# Patient Record
Sex: Male | Born: 1970 | Race: White | Hispanic: No | Marital: Married | Smoking: Never smoker
Health system: Southern US, Community
[De-identification: ages and names within clinical notes are randomized; demographics above are authoritative.]

## PROBLEM LIST (undated history)

## (undated) DIAGNOSIS — E119 Type 2 diabetes mellitus without complications: Secondary | ICD-10-CM

## (undated) DIAGNOSIS — I10 Essential (primary) hypertension: Secondary | ICD-10-CM

## (undated) DIAGNOSIS — I5032 Chronic diastolic (congestive) heart failure: Secondary | ICD-10-CM

## (undated) HISTORY — PX: SPHINCTEROTOMY: SHX5279

## (undated) HISTORY — PX: SKIN DEBRIDEMENT: SHX5235

## (undated) HISTORY — PX: TOE AMPUTATION: SHX809

---

## 2014-10-23 ENCOUNTER — Emergency Department
Admit: 2014-10-23 | Disposition: A | Discharge status: Home / Self Care | Payer: Self-pay | Admitting: Emergency Medicine

## 2014-10-23 LAB — BASIC METABOLIC PANEL
ANION GAP: 8 (ref 7–16)
BUN: 14 mg/dL
CREATININE: 0.78 mg/dL
Calcium, Total: 8.3 mg/dL — ABNORMAL LOW
Chloride: 96 mmol/L — ABNORMAL LOW
Co2: 28 mmol/L
EGFR (Non-African Amer.): >60
Glucose: 322 mg/dL — ABNORMAL HIGH
Potassium: 3.8 mmol/L
Sodium: 132 mmol/L — ABNORMAL LOW

## 2014-10-23 LAB — CBC
HCT: 41.4 % (ref 40.0–52.0)
HGB: 13.5 g/dL (ref 13.0–18.0)
MCH: 28.9 pg (ref 26.0–34.0)
MCHC: 32.7 g/dL (ref 32.0–36.0)
MCV: 88 fL (ref 80–100)
Platelet: 351 10*3/uL (ref 150–440)
RBC: 4.68 10*6/uL (ref 4.40–5.90)
RDW: 12.8 % (ref 11.5–14.5)
WBC: 8 10*3/uL (ref 3.8–10.6)

## 2014-10-23 LAB — TROPONIN I

## 2014-10-23 LAB — PRO B NATRIURETIC PEPTIDE: B-Type Natriuretic Peptide: 29 pg/mL

## 2017-03-04 ENCOUNTER — Ambulatory Visit
Admission: RE | Admit: 2017-03-04 | Discharge: 2017-03-04 | Disposition: A | Discharge status: Home / Self Care | Payer: Disability Insurance | Source: Ambulatory Visit | Attending: Family Medicine | Admitting: Family Medicine

## 2017-03-04 ENCOUNTER — Other Ambulatory Visit: Payer: Self-pay | Admitting: Family Medicine

## 2017-03-04 DIAGNOSIS — M4186 Other forms of scoliosis, lumbar region: Secondary | ICD-10-CM | POA: Diagnosis not present

## 2017-03-04 DIAGNOSIS — M1711 Unilateral primary osteoarthritis, right knee: Secondary | ICD-10-CM | POA: Insufficient documentation

## 2017-03-04 DIAGNOSIS — M1611 Unilateral primary osteoarthritis, right hip: Secondary | ICD-10-CM | POA: Insufficient documentation

## 2017-03-04 DIAGNOSIS — M5136 Other intervertebral disc degeneration, lumbar region: Secondary | ICD-10-CM | POA: Diagnosis not present

## 2017-03-04 DIAGNOSIS — M1990 Unspecified osteoarthritis, unspecified site: Secondary | ICD-10-CM

## 2017-03-04 DIAGNOSIS — M25461 Effusion, right knee: Secondary | ICD-10-CM | POA: Insufficient documentation

## 2017-03-04 DIAGNOSIS — M25552 Pain in left hip: Secondary | ICD-10-CM | POA: Diagnosis not present

## 2017-03-04 DIAGNOSIS — M25559 Pain in unspecified hip: Secondary | ICD-10-CM | POA: Diagnosis present

## 2017-03-04 DIAGNOSIS — M25561 Pain in right knee: Secondary | ICD-10-CM | POA: Diagnosis present

## 2018-02-12 ENCOUNTER — Emergency Department: Payer: Medicaid Other

## 2018-02-12 ENCOUNTER — Observation Stay
Admit: 2018-02-12 | Discharge: 2018-02-12 | Disposition: A | Discharge status: Home / Self Care | Payer: Medicaid Other | Attending: Internal Medicine | Admitting: Internal Medicine

## 2018-02-12 ENCOUNTER — Other Ambulatory Visit: Payer: Self-pay

## 2018-02-12 ENCOUNTER — Inpatient Hospital Stay
Admission: EM | Admit: 2018-02-12 | Discharge: 2018-02-27 | DRG: 291 | Disposition: A | Discharge status: Home Health | Payer: Medicaid Other | Attending: Internal Medicine | Admitting: Internal Medicine

## 2018-02-12 DIAGNOSIS — E1121 Type 2 diabetes mellitus with diabetic nephropathy: Secondary | ICD-10-CM | POA: Diagnosis present

## 2018-02-12 DIAGNOSIS — E8809 Other disorders of plasma-protein metabolism, not elsewhere classified: Secondary | ICD-10-CM | POA: Diagnosis present

## 2018-02-12 DIAGNOSIS — L97919 Non-pressure chronic ulcer of unspecified part of right lower leg with unspecified severity: Secondary | ICD-10-CM | POA: Diagnosis present

## 2018-02-12 DIAGNOSIS — J9602 Acute respiratory failure with hypercapnia: Secondary | ICD-10-CM | POA: Diagnosis present

## 2018-02-12 DIAGNOSIS — I110 Hypertensive heart disease with heart failure: Principal | ICD-10-CM | POA: Diagnosis present

## 2018-02-12 DIAGNOSIS — L03116 Cellulitis of left lower limb: Secondary | ICD-10-CM | POA: Diagnosis present

## 2018-02-12 DIAGNOSIS — I10 Essential (primary) hypertension: Secondary | ICD-10-CM | POA: Diagnosis present

## 2018-02-12 DIAGNOSIS — D649 Anemia, unspecified: Secondary | ICD-10-CM | POA: Diagnosis present

## 2018-02-12 DIAGNOSIS — Z89412 Acquired absence of left great toe: Secondary | ICD-10-CM

## 2018-02-12 DIAGNOSIS — Z6834 Body mass index (BMI) 34.0-34.9, adult: Secondary | ICD-10-CM

## 2018-02-12 DIAGNOSIS — E875 Hyperkalemia: Secondary | ICD-10-CM | POA: Diagnosis not present

## 2018-02-12 DIAGNOSIS — N179 Acute kidney failure, unspecified: Secondary | ICD-10-CM | POA: Diagnosis present

## 2018-02-12 DIAGNOSIS — I5033 Acute on chronic diastolic (congestive) heart failure: Secondary | ICD-10-CM | POA: Diagnosis present

## 2018-02-12 DIAGNOSIS — E119 Type 2 diabetes mellitus without complications: Secondary | ICD-10-CM

## 2018-02-12 DIAGNOSIS — Z833 Family history of diabetes mellitus: Secondary | ICD-10-CM

## 2018-02-12 DIAGNOSIS — Z8349 Family history of other endocrine, nutritional and metabolic diseases: Secondary | ICD-10-CM

## 2018-02-12 DIAGNOSIS — J189 Pneumonia, unspecified organism: Secondary | ICD-10-CM | POA: Diagnosis present

## 2018-02-12 DIAGNOSIS — Z794 Long term (current) use of insulin: Secondary | ICD-10-CM

## 2018-02-12 DIAGNOSIS — R0602 Shortness of breath: Secondary | ICD-10-CM

## 2018-02-12 DIAGNOSIS — E1165 Type 2 diabetes mellitus with hyperglycemia: Secondary | ICD-10-CM | POA: Diagnosis present

## 2018-02-12 DIAGNOSIS — E669 Obesity, unspecified: Secondary | ICD-10-CM | POA: Diagnosis present

## 2018-02-12 DIAGNOSIS — J9811 Atelectasis: Secondary | ICD-10-CM

## 2018-02-12 DIAGNOSIS — L03115 Cellulitis of right lower limb: Secondary | ICD-10-CM | POA: Diagnosis present

## 2018-02-12 DIAGNOSIS — N139 Obstructive and reflux uropathy, unspecified: Secondary | ICD-10-CM | POA: Diagnosis present

## 2018-02-12 DIAGNOSIS — J441 Chronic obstructive pulmonary disease with (acute) exacerbation: Secondary | ICD-10-CM | POA: Diagnosis present

## 2018-02-12 DIAGNOSIS — E1140 Type 2 diabetes mellitus with diabetic neuropathy, unspecified: Secondary | ICD-10-CM | POA: Diagnosis present

## 2018-02-12 DIAGNOSIS — Z91138 Patient's unintentional underdosing of medication regimen for other reason: Secondary | ICD-10-CM

## 2018-02-12 DIAGNOSIS — L97929 Non-pressure chronic ulcer of unspecified part of left lower leg with unspecified severity: Secondary | ICD-10-CM | POA: Diagnosis present

## 2018-02-12 DIAGNOSIS — T501X6A Underdosing of loop [high-ceiling] diuretics, initial encounter: Secondary | ICD-10-CM | POA: Diagnosis present

## 2018-02-12 DIAGNOSIS — I509 Heart failure, unspecified: Secondary | ICD-10-CM

## 2018-02-12 DIAGNOSIS — J9601 Acute respiratory failure with hypoxia: Secondary | ICD-10-CM | POA: Diagnosis present

## 2018-02-12 DIAGNOSIS — E11622 Type 2 diabetes mellitus with other skin ulcer: Secondary | ICD-10-CM | POA: Diagnosis present

## 2018-02-12 DIAGNOSIS — E1151 Type 2 diabetes mellitus with diabetic peripheral angiopathy without gangrene: Secondary | ICD-10-CM | POA: Diagnosis present

## 2018-02-12 DIAGNOSIS — K219 Gastro-esophageal reflux disease without esophagitis: Secondary | ICD-10-CM | POA: Diagnosis present

## 2018-02-12 HISTORY — DX: Essential (primary) hypertension: I10

## 2018-02-12 HISTORY — DX: Type 2 diabetes mellitus without complications: E11.9

## 2018-02-12 HISTORY — DX: Chronic diastolic (congestive) heart failure: I50.32

## 2018-02-12 LAB — BASIC METABOLIC PANEL
Anion gap: 5 (ref 5–15)
Anion gap: 4 — ABNORMAL LOW (ref 5–15)
BUN: 29 mg/dL — ABNORMAL HIGH (ref 6–20)
BUN: 32 mg/dL — AB (ref 6–20)
CALCIUM: 8.2 mg/dL — AB (ref 8.9–10.3)
CHLORIDE: 114 mmol/L — AB (ref 98–111)
CHLORIDE: 115 mmol/L — AB (ref 98–111)
CO2: 23 mmol/L (ref 22–32)
CO2: 26 mmol/L (ref 22–32)
CREATININE: 1.26 mg/dL — AB (ref 0.61–1.24)
Calcium: 8.3 mg/dL — ABNORMAL LOW (ref 8.9–10.3)
Creatinine, Ser: 1.22 mg/dL (ref 0.61–1.24)
GFR calc Af Amer: >60 mL/min (ref >60)
GFR calc non Af Amer: >60 mL/min (ref >60)
GFR calc non Af Amer: >60 mL/min (ref >60)
GLUCOSE: 182 mg/dL — AB (ref 70–99)
Glucose, Bld: 270 mg/dL — ABNORMAL HIGH (ref 70–99)
POTASSIUM: 4.8 mmol/L (ref 3.5–5.1)
Potassium: 4.4 mmol/L (ref 3.5–5.1)
Sodium: 142 mmol/L (ref 135–145)
Sodium: 145 mmol/L (ref 135–145)

## 2018-02-12 LAB — CBC
HCT: 27.9 % — ABNORMAL LOW (ref 40.0–52.0)
HEMATOCRIT: 30.6 % — AB (ref 40.0–52.0)
HEMOGLOBIN: 10.1 g/dL — AB (ref 13.0–18.0)
Hemoglobin: 9.2 g/dL — ABNORMAL LOW (ref 13.0–18.0)
MCH: 26.4 pg (ref 26.0–34.0)
MCH: 26.3 pg (ref 26.0–34.0)
MCHC: 33 g/dL (ref 32.0–36.0)
MCHC: 32.9 g/dL (ref 32.0–36.0)
MCV: 80.1 fL (ref 80.0–100.0)
MCV: 79.9 fL — AB (ref 80.0–100.0)
PLATELETS: 332 10*3/uL (ref 150–440)
Platelets: 388 10*3/uL (ref 150–440)
RBC: 3.83 MIL/uL — AB (ref 4.40–5.90)
RBC: 3.49 MIL/uL — AB (ref 4.40–5.90)
RDW: 17.6 % — ABNORMAL HIGH (ref 11.5–14.5)
RDW: 17.3 % — AB (ref 11.5–14.5)
WBC: 8.7 10*3/uL (ref 3.8–10.6)
WBC: 7.8 10*3/uL (ref 3.8–10.6)

## 2018-02-12 LAB — GLUCOSE, CAPILLARY
GLUCOSE-CAPILLARY: 265 mg/dL — AB (ref 70–99)
GLUCOSE-CAPILLARY: 109 mg/dL — AB (ref 70–99)
Glucose-Capillary: 182 mg/dL — ABNORMAL HIGH (ref 70–99)
Glucose-Capillary: 121 mg/dL — ABNORMAL HIGH (ref 70–99)

## 2018-02-12 LAB — FERRITIN: Ferritin: 36 ng/mL (ref 24–336)

## 2018-02-12 LAB — BRAIN NATRIURETIC PEPTIDE: B NATRIURETIC PEPTIDE 5: 151 pg/mL — AB (ref 0.0–100.0)

## 2018-02-12 LAB — TROPONIN I: Troponin I: <0.03 ng/mL (ref <0.03)

## 2018-02-12 MED ORDER — IPRATROPIUM-ALBUTEROL 0.5-2.5 (3) MG/3ML IN SOLN
3.0000 mL | Freq: Four times a day (QID) | RESPIRATORY_TRACT | Status: DC
  Administered 2018-02-12 – 2018-02-22 (×38): 3 mL via RESPIRATORY_TRACT
  Filled 2018-02-12 (×41): qty 3

## 2018-02-12 MED ORDER — POTASSIUM CHLORIDE CRYS ER 20 MEQ PO TBCR
40.0000 meq | EXTENDED_RELEASE_TABLET | Freq: Two times a day (BID) | ORAL | Status: DC
  Administered 2018-02-12 – 2018-02-13 (×3): 40 meq via ORAL
  Filled 2018-02-12 (×3): qty 2

## 2018-02-12 MED ORDER — ACETAMINOPHEN 650 MG RE SUPP: 650.0000 mg | Freq: Four times a day (QID) | RECTAL | Status: DC | PRN

## 2018-02-12 MED ORDER — ACETAMINOPHEN 325 MG PO TABS: 650.0000 mg | ORAL_TABLET | Freq: Four times a day (QID) | ORAL | Status: DC | PRN

## 2018-02-12 MED ORDER — IPRATROPIUM-ALBUTEROL 0.5-2.5 (3) MG/3ML IN SOLN
RESPIRATORY_TRACT | Status: AC
  Administered 2018-02-12: 3 mL
  Filled 2018-02-12: qty 3

## 2018-02-12 MED ORDER — FUROSEMIDE 10 MG/ML IJ SOLN
80.0000 mg | Freq: Once | INTRAMUSCULAR | Status: AC
  Administered 2018-02-12: 80 mg via INTRAVENOUS

## 2018-02-12 MED ORDER — ONDANSETRON HCL 4 MG PO TABS: 4.0000 mg | ORAL_TABLET | Freq: Four times a day (QID) | ORAL | Status: DC | PRN

## 2018-02-12 MED ORDER — INSULIN ASPART 100 UNIT/ML ~~LOC~~ SOLN
0.0000 [IU] | Freq: Every day | SUBCUTANEOUS | Status: DC
  Administered 2018-02-14: 2 [IU] via SUBCUTANEOUS
  Administered 2018-02-15 – 2018-02-16 (×2): 3 [IU] via SUBCUTANEOUS
  Administered 2018-02-17: 4 [IU] via SUBCUTANEOUS
  Filled 2018-02-12 (×4): qty 1

## 2018-02-12 MED ORDER — LIDOCAINE 5 % EX PTCH
1.0000 | MEDICATED_PATCH | CUTANEOUS | Status: DC
  Administered 2018-02-12: 1 via TRANSDERMAL
  Filled 2018-02-12: qty 1

## 2018-02-12 MED ORDER — LIDOCAINE 5 % EX PTCH
2.0000 | MEDICATED_PATCH | CUTANEOUS | Status: DC
  Administered 2018-02-12 – 2018-02-26 (×14): 2 via TRANSDERMAL
  Filled 2018-02-12 (×19): qty 2

## 2018-02-12 MED ORDER — NITROGLYCERIN 2 % TD OINT
1.0000 [in_us] | TOPICAL_OINTMENT | Freq: Once | TRANSDERMAL | Status: AC
  Administered 2018-02-12: 1 [in_us] via TOPICAL
  Filled 2018-02-12: qty 1

## 2018-02-12 MED ORDER — INSULIN GLARGINE 100 UNIT/ML ~~LOC~~ SOLN
20.0000 [IU] | Freq: Every day | SUBCUTANEOUS | Status: DC
  Administered 2018-02-12 – 2018-02-15 (×4): 20 [IU] via SUBCUTANEOUS
  Filled 2018-02-12 (×6): qty 0.2

## 2018-02-12 MED ORDER — PANTOPRAZOLE SODIUM 40 MG PO TBEC
40.0000 mg | DELAYED_RELEASE_TABLET | Freq: Two times a day (BID) | ORAL | Status: DC
  Administered 2018-02-12 – 2018-02-27 (×32): 40 mg via ORAL
  Filled 2018-02-12 (×32): qty 1

## 2018-02-12 MED ORDER — BUDESONIDE 0.5 MG/2ML IN SUSP
0.5000 mg | Freq: Two times a day (BID) | RESPIRATORY_TRACT | Status: DC
  Administered 2018-02-12 – 2018-02-27 (×30): 0.5 mg via RESPIRATORY_TRACT
  Filled 2018-02-12 (×31): qty 2

## 2018-02-12 MED ORDER — INSULIN ASPART 100 UNIT/ML ~~LOC~~ SOLN
0.0000 [IU] | Freq: Three times a day (TID) | SUBCUTANEOUS | Status: DC
  Administered 2018-02-12: 2 [IU] via SUBCUTANEOUS
  Administered 2018-02-12 – 2018-02-13 (×2): 5 [IU] via SUBCUTANEOUS
  Administered 2018-02-13 (×2): 3 [IU] via SUBCUTANEOUS
  Administered 2018-02-14: 9 [IU] via SUBCUTANEOUS
  Administered 2018-02-14 (×2): 7 [IU] via SUBCUTANEOUS
  Filled 2018-02-12 (×7): qty 1

## 2018-02-12 MED ORDER — TRAZODONE HCL 50 MG PO TABS
150.0000 mg | ORAL_TABLET | Freq: Every evening | ORAL | Status: DC | PRN
  Filled 2018-02-12: qty 1

## 2018-02-12 MED ORDER — ASPIRIN 81 MG PO CHEW
324.0000 mg | CHEWABLE_TABLET | Freq: Once | ORAL | Status: AC
  Administered 2018-02-12: 324 mg via ORAL
  Filled 2018-02-12: qty 4

## 2018-02-12 MED ORDER — MORPHINE SULFATE (PF) 4 MG/ML IV SOLN
4.0000 mg | Freq: Once | INTRAVENOUS | Status: AC
  Administered 2018-02-12: 4 mg via INTRAVENOUS
  Filled 2018-02-12: qty 1

## 2018-02-12 MED ORDER — GABAPENTIN 400 MG PO CAPS
800.0000 mg | ORAL_CAPSULE | Freq: Three times a day (TID) | ORAL | Status: DC
  Administered 2018-02-12 – 2018-02-27 (×46): 800 mg via ORAL
  Filled 2018-02-12 (×46): qty 2

## 2018-02-12 MED ORDER — INSULIN GLARGINE 100 UNITS/ML SOLOSTAR PEN: 20.0000 [IU] | PEN_INJECTOR | Freq: Every day | SUBCUTANEOUS | Status: DC

## 2018-02-12 MED ORDER — ONDANSETRON HCL 4 MG/2ML IJ SOLN: 4.0000 mg | Freq: Four times a day (QID) | INTRAMUSCULAR | Status: DC | PRN

## 2018-02-12 MED ORDER — ENOXAPARIN SODIUM 40 MG/0.4ML ~~LOC~~ SOLN
40.0000 mg | SUBCUTANEOUS | Status: DC
  Administered 2018-02-12 – 2018-02-25 (×14): 40 mg via SUBCUTANEOUS
  Filled 2018-02-12 (×14): qty 0.4

## 2018-02-12 MED ORDER — LISINOPRIL 10 MG PO TABS
10.0000 mg | ORAL_TABLET | Freq: Every day | ORAL | Status: DC
  Administered 2018-02-12 – 2018-02-27 (×16): 10 mg via ORAL
  Filled 2018-02-12 (×16): qty 1

## 2018-02-12 MED ORDER — OXYCODONE HCL 5 MG PO TABS
5.0000 mg | ORAL_TABLET | Freq: Four times a day (QID) | ORAL | Status: DC | PRN
  Administered 2018-02-12 (×3): 5 mg via ORAL
  Filled 2018-02-12 (×3): qty 1

## 2018-02-12 MED ORDER — ENOXAPARIN SODIUM 40 MG/0.4ML ~~LOC~~ SOLN: 40.0000 mg | SUBCUTANEOUS | Status: DC

## 2018-02-12 MED ORDER — SPIRONOLACTONE 25 MG PO TABS
25.0000 mg | ORAL_TABLET | Freq: Every day | ORAL | Status: DC
  Administered 2018-02-12 – 2018-02-14 (×3): 25 mg via ORAL
  Filled 2018-02-12 (×3): qty 1

## 2018-02-12 MED ORDER — FUROSEMIDE 40 MG PO TABS
80.0000 mg | ORAL_TABLET | Freq: Two times a day (BID) | ORAL | Status: DC
  Administered 2018-02-12: 80 mg via ORAL
  Filled 2018-02-12: qty 2

## 2018-02-12 MED ORDER — FUROSEMIDE 10 MG/ML IJ SOLN
6.0000 mg/h | INTRAVENOUS | Status: DC
  Administered 2018-02-12: 8 mg/h via INTRAVENOUS
  Administered 2018-02-13: 4 mg/h via INTRAVENOUS
  Administered 2018-02-16 – 2018-02-20 (×5): 8 mg/h via INTRAVENOUS
  Filled 2018-02-12 (×9): qty 25

## 2018-02-12 NOTE — Progress Notes (Signed)
*  PRELIMINARY RESULTS* Echocardiogram 2D Echocardiogram has been performed.  John GulaJoan M Casy Lawson 02/12/2018, 11:02 AM

## 2018-02-12 NOTE — ED Provider Notes (Signed)
[facility] Emergency Department Provider Note   ____________________________________________   First MD Initiated Contact with Patient 02/12/18 0122     (approximate)  I have reviewed the triage vital signs and the nursing notes.   HISTORY  Chief Complaint Shortness of Breath    HPI John Lawson is a 47 y.o. male brought to the ED from home via EMS with a chief complaint of shortness of breath.  Patient has a history of hypertension, diabetes, CHF who ran out of his Lasix 1 week ago.  Presents with a 50 pound weight gain over the past week, diffuse swelling and shortness of breath with wheezing.  Also complains of chest tightness.  Denies fever, chills, cough, abdominal pain, nausea or vomiting.  Patient denies recent travel or trauma.   Past Medical History:  Diagnosis Date  . Chronic diastolic CHF (congestive heart failure) (HCC)   . Diabetes mellitus without complication (HCC)   . Hypertension     Patient Active Problem List   Diagnosis Date Noted  . Acute on chronic diastolic CHF (congestive heart failure) (HCC) 02/12/2018  . Uncontrolled hypertension 02/12/2018  . Diabetes (HCC) 02/12/2018    Past Surgical History:  Procedure Laterality Date  . SKIN DEBRIDEMENT    . SPHINCTEROTOMY    . TOE AMPUTATION      Prior to Admission medications   Medication Sig Start Date End Date Taking? Authorizing Provider  albuterol (PROVENTIL HFA;VENTOLIN HFA) 108 (90 Base) MCG/ACT inhaler Inhale 2 puffs into the lungs every 6 (six) hours as needed for wheezing.    [provider]  cholestyramine (QUESTRAN) 4 g packet Take 4 g by mouth 2 (two) times daily.    [provider]  furosemide (LASIX) 80 MG tablet Take 80 mg by mouth 2 (two) times daily.    [provider]  gabapentin (NEURONTIN) 800 MG tablet Take 800 mg by mouth 3 (three) times daily.    [provider]  insulin glargine (LANTUS) 100 unit/mL SOPN Inject 28  Units into the skin at bedtime.    [provider]  lisinopril (PRINIVIL,ZESTRIL) 10 MG tablet Take 10 mg by mouth daily.    [provider]  metFORMIN (GLUCOPHAGE-XR) 500 MG 24 hr tablet Take 2,000 mg by mouth daily with breakfast.    [provider]  Multiple Vitamin (MULTIVITAMIN WITH MINERALS) TABS tablet Take 1 tablet by mouth daily.    [provider]  naproxen (NAPROSYN) 500 MG tablet Take 500 mg by mouth 2 (two) times daily as needed for mild pain.    [provider]  omeprazole (PRILOSEC) 20 MG capsule Take 40 mg by mouth 2 (two) times daily before a meal.    [provider]  spironolactone (ALDACTONE) 25 MG tablet Take 25 mg by mouth daily.    [provider]  traZODone (DESYREL) 150 MG tablet Take 150 mg by mouth at bedtime as needed for sleep.    [provider]    Allergies Patient has no allergy information on record.  Family History  Problem Relation Age of Onset  . Diabetes Mother   . Cancer Mother   . Thyroid disease Mother   . Diabetes Father   . Diabetes Sister     Social History Social History   Tobacco Use  . Smoking status: Unknown If Ever Smoked  Substance Use Topics  . Alcohol use: Not on file  . Drug use: Not on file    Review of Systems  Constitutional: No fever/chills Eyes: No visual changes. ENT: No sore throat. Cardiovascular: Positive for chest pain. Respiratory: Positive for shortness of breath. Gastrointestinal: No abdominal pain.  No nausea, no vomiting.  No diarrhea.  No constipation. Genitourinary: Negative for dysuria. Musculoskeletal: Positive for diffuse swelling.  Negative for back pain. Skin: Negative for rash. Neurological: Negative for headaches, focal weakness or numbness.   ____________________________________________   PHYSICAL EXAM:  VITAL SIGNS: ED Triage Vitals  Enc Vitals Group     BP 02/12/18 0115 (!) 210/103     Pulse Rate 02/12/18 0115 (!)  103     Resp 02/12/18 0115 (!) 23     Temp 02/12/18 0115 97.6 F (36.4 C)     Temp Source 02/12/18 0115 Oral     SpO2 02/12/18 0115 97 %     Weight 02/12/18 0116 225 lb (102.1 kg)     Height 02/12/18 0116 6\' 2"  (1.88 m)     Head Circumference --      Peak Flow --      Pain Score 02/12/18 0116 10     Pain Loc --      Pain Edu? --      Excl. in GC? --     Constitutional: Alert and oriented. Well appearing and in moderate acute distress. Eyes: Conjunctivae are normal. PERRL. EOMI. Head: Atraumatic. Nose: No congestion/rhinnorhea. Mouth/Throat: Mucous membranes are moist.  Oropharynx non-erythematous. Neck: No stridor.   Cardiovascular: Tachycardic rate, regular rhythm. Grossly normal heart sounds.  Good peripheral circulation. Respiratory: Increased respiratory effort.  No retractions. Lungs with diffuse rales. Gastrointestinal: Moderate swelling.  Soft and nontender. No distention. No abdominal bruits. No CVA tenderness. Genitourinary: Scrotal swelling. Musculoskeletal: No lower extremity tenderness. 3+ BLE pitting edema.  No joint effusions. Neurologic:  Normal speech and language. No gross focal neurologic deficits are appreciated.  Skin:  Skin is warm, dry and intact. No rash noted. Psychiatric: Mood and affect are normal. Speech and behavior are normal.  ____________________________________________   LABS (all labs ordered are listed, but only abnormal results are displayed)  Labs Reviewed  CBC - Abnormal; Notable for the following components:      Result Value   RBC 3.83 (*)    Hemoglobin 10.1 (*)    HCT 30.6 (*)    RDW 17.6 (*)    All other components within normal limits  BASIC METABOLIC PANEL - Abnormal; Notable for the following components:   Chloride 114 (*)    Glucose, Bld 182 (*)    BUN 29 (*)    Calcium 8.3 (*)    All other components within normal limits  BRAIN NATRIURETIC PEPTIDE - Abnormal; Notable for the following components:   B Natriuretic Peptide  151.0 (*)    All other components within normal limits  TROPONIN I   ____________________________________________  EKG  ED ECG REPORT I, SUNG,JADE J, the attending physician, personally viewed and interpreted this ECG.   Date: 02/12/2018  EKG Time: 0115  Rate: 103  Rhythm: sinus tachycardia  Axis: Normal  Intervals:none  ST&T Change: Nonspecific  ____________________________________________  RADIOLOGY  ED MD interpretation: Interstitial edema; small right pleural effusion  Official radiology report(s): Dg Chest Port 1 View  Result Date: 02/12/2018 CLINICAL DATA:  Acute onset of shortness of breath. Expiratory wheezing. EXAM: PORTABLE CHEST 1 VIEW COMPARISON:  Chest radiograph performed 10/23/2014, and CTA of the chest performed 10/24/2014 FINDINGS: A small right pleural effusion is noted, with associated opacity. Increased interstitial markings may reflect mild interstitial  edema. Alternatively, right basilar pneumonia could have a similar appearance. No pneumothorax is seen. The cardiomediastinal silhouette is borderline normal in size. No acute osseous abnormalities are identified. IMPRESSION: Small right pleural effusion noted. Increased interstitial markings may reflect mild interstitial edema. Alternatively, right basilar pneumonia could have a similar appearance. Electronically Signed   By: Roanna Raider M.D.   On: 02/12/2018 02:36    ____________________________________________   PROCEDURES  Procedure(s) performed: None  Procedures  Critical Care performed: Yes, see critical care note(s)   CRITICAL CARE Performed by: Irean Hong   Total critical care time: 30 minutes  Critical care time was exclusive of separately billable procedures and treating other patients.  Critical care was necessary to treat or prevent imminent or life-threatening deterioration.  Critical care was time spent personally by me on the following activities: development of treatment plan  with patient and/or surrogate as well as nursing, discussions with consultants, evaluation of patient's response to treatment, examination of patient, obtaining history from patient or surrogate, ordering and performing treatments and interventions, ordering and review of laboratory studies, ordering and review of radiographic studies, pulse oximetry and re-evaluation of patient's condition.  ____________________________________________   INITIAL IMPRESSION / ASSESSMENT AND PLAN / ED COURSE  As part of my medical decision making, I reviewed the following data within the electronic MEDICAL RECORD NUMBER Nursing notes reviewed and incorporated, Labs reviewed, EKG interpreted, Old chart reviewed, Radiograph reviewed, Discussed with admitting physician and Notes from prior ED visits   47 year old male with hypertension, diabetes and congestive heart failure who presents with increased generalized swelling and shortness of breath after running out of his Lasix x1 week. Differential includes, but is not limited to, viral syndrome, bronchitis including COPD exacerbation, pneumonia, reactive airway disease including asthma, CHF including exacerbation with or without pulmonary/interstitial edema, pneumothorax, ACS, thoracic trauma, and pulmonary embolism.  Will administer aspirin, morphine, Lasix and apply nitroglycerin paste.  Anticipate hospitalization.  Clinical Course as of Feb 13 240  Fri Feb 12, 2018  0240 Patient resting in no acute distress.  Updated him of all test results.  Discussed with hospitalist who will evaluate patient in the emergency department for admission.   [JS]    Clinical Course User Index [JS] Irean Hong, MD     ____________________________________________   FINAL CLINICAL IMPRESSION(S) / ED DIAGNOSES  Final diagnoses:  SOB (shortness of breath)  Acute on chronic congestive heart failure, unspecified heart failure type [facility])  Essential hypertension     ED Discharge  Orders    None       Note:  This document was prepared using Dragon voice recognition software and may include unintentional dictation errors.    Irean Hong, MD 02/12/18 [phone]

## 2018-02-12 NOTE — Progress Notes (Signed)
Patient refuses bed alarm, patient has history of multiple falls. Patient on lasix drip is steady on his feet. Patient educated about safety concern and the risk of him falling, still refuses bed alarm. RN will continue to monitor.

## 2018-02-12 NOTE — H&P (Signed)
[facility] [place] [facility]   PATIENT NAME: John RiggsMichael Barco    MR#:  [id number]  DATE OF BIRTH:  02/18/1971  DATE OF ADMISSION:  02/12/2018  PRIMARY CARE PHYSICIAN: [facility]   REQUESTING/REFERRING PHYSICIAN: Dolores FrameSung, MD  CHIEF COMPLAINT:   Chief Complaint  Patient presents with  . Shortness of Breath    HISTORY OF PRESENT ILLNESS:  John Lawson  is a 47 y.o. male who presents with chief complaint as above.  Patient ran out of his Lasix, and has not been able to take any for the last 5 or 6 days.  Patient has had 3 to 4 days of increasing lower extremity edema with 1 to 2 days of increasing shortness of breath.  He has been told that he has congestive heart failure in the past, and has been hospitalized for similar symptoms, but is never been followed by cardiology.  Tonight his BNP is mildly elevated at 151, but he has some pulmonary edema, uncontrolled hypertension, significant lower extremity edema, all consistent with exacerbation of heart failure.  Prior echocardiogram reports seen on chart review seem to indicate that the patient in the past has had a decent ejection fraction, but perhaps some diastolic dysfunction.  Hospitalist were called for admission  PAST MEDICAL HISTORY:   Past Medical History:  Diagnosis Date  . Chronic diastolic CHF (congestive heart failure) (HCC)   . Diabetes mellitus without complication (HCC)   . Hypertension      PAST SURGICAL HISTORY:   Past Surgical History:  Procedure Laterality Date  . SKIN DEBRIDEMENT    . SPHINCTEROTOMY    . TOE AMPUTATION       SOCIAL HISTORY:   Social History   Tobacco Use  . Smoking status: Unknown If Ever Smoked  Substance Use Topics  . Alcohol use: Not on file     FAMILY HISTORY:   Family History  Problem Relation Age of Onset  . Diabetes Mother   . Cancer Mother   . Thyroid disease Mother   . Diabetes Father   . Diabetes Sister       DRUG ALLERGIES:  Not on File  MEDICATIONS AT HOME:   Prior to Admission medications   Medication Sig Start Date End Date Taking? Authorizing Provider  albuterol (PROVENTIL HFA;VENTOLIN HFA) 108 (90 Base) MCG/ACT inhaler Inhale 2 puffs into the lungs every 6 (six) hours as needed for wheezing.    [provider]  cholestyramine (QUESTRAN) 4 g packet Take 4 g by mouth 2 (two) times daily.    [provider]  furosemide (LASIX) 80 MG tablet Take 80 mg by mouth 2 (two) times daily.    [provider]  gabapentin (NEURONTIN) 800 MG tablet Take 800 mg by mouth 3 (three) times daily.    [provider]  insulin glargine (LANTUS) 100 unit/mL SOPN Inject 28 Units into the skin at bedtime.    [provider]  lisinopril (PRINIVIL,ZESTRIL) 10 MG tablet Take 10 mg by mouth daily.    [provider]  metFORMIN (GLUCOPHAGE-XR) 500 MG 24 hr tablet Take 2,000 mg by mouth daily with breakfast.    [provider]  Multiple Vitamin (MULTIVITAMIN WITH MINERALS) TABS tablet Take 1 tablet by mouth daily.    [provider]  naproxen (NAPROSYN) 500 MG tablet Take 500 mg by mouth 2 (two) times daily as needed for mild pain.    [provider]  omeprazole (PRILOSEC) 20  MG capsule Take 40 mg by mouth 2 (two) times daily before a meal.    [provider]  spironolactone (ALDACTONE) 25 MG tablet Take 25 mg by mouth daily.    [provider]  traZODone (DESYREL) 150 MG tablet Take 150 mg by mouth at bedtime as needed for sleep.    [provider]    REVIEW OF SYSTEMS:  Review of Systems  Constitutional: Negative for chills, fever, malaise/fatigue and weight loss.  HENT: Negative for ear pain, hearing loss and tinnitus.   Eyes: Negative for blurred vision, double vision, pain and redness.  Respiratory: Positive for shortness of breath. Negative for cough and hemoptysis.   Cardiovascular: Positive for  orthopnea and leg swelling. Negative for chest pain and palpitations.  Gastrointestinal: Negative for abdominal pain, constipation, diarrhea, nausea and vomiting.  Genitourinary: Negative for dysuria, frequency and hematuria.  Musculoskeletal: Negative for back pain, joint pain and neck pain.  Skin:       No acne, rash, or lesions  Neurological: Negative for dizziness, tremors, focal weakness and weakness.  Endo/Heme/Allergies: Negative for polydipsia. Does not bruise/bleed easily.  Psychiatric/Behavioral: Negative for depression. The patient is not nervous/anxious and does not have insomnia.      VITAL SIGNS:   Vitals:   02/12/18 0115 02/12/18 0116  BP: (!) 210/103   Pulse: (!) 103   Resp: (!) 23   Temp: 97.6 F (36.4 C)   TempSrc: Oral   SpO2: 97%   Weight:  102.1 kg  Height:  6\' 2"  (1.88 m)   Wt Readings from Last 3 Encounters:  02/12/18 102.1 kg    PHYSICAL EXAMINATION:  Physical Exam  Vitals reviewed. Constitutional: He is oriented to person, place, and time. He appears well-developed and well-nourished. No distress.  HENT:  Head: Normocephalic and atraumatic.  Mouth/Throat: Oropharynx is clear and moist.  Eyes: Pupils are equal, round, and reactive to light. Conjunctivae and EOM are normal. No scleral icterus.  Neck: Normal range of motion. Neck supple. No JVD present. No thyromegaly present.  Cardiovascular: Normal rate, regular rhythm and intact distal pulses. Exam reveals no gallop and no friction rub.  No murmur heard. Respiratory: He is in respiratory distress (Mild). He has no wheezes. He has rales.  GI: Soft. Bowel sounds are normal. He exhibits no distension. There is no tenderness.  Musculoskeletal: Normal range of motion. He exhibits edema (Bilateral lower extremity with edematous blistering).  No arthritis, no gout  Lymphadenopathy:    He has no cervical adenopathy.  Neurological: He is alert and oriented to person, place, and time. No cranial nerve  deficit.  No dysarthria, no aphasia  Skin: Skin is warm and dry. No rash noted. No erythema.  Psychiatric: He has a normal mood and affect. His behavior is normal. Judgment and thought content normal.    LABORATORY PANEL:   CBC Recent Labs  Lab 02/12/18 0124  WBC 8.7  HGB 10.1*  HCT 30.6*  PLT 388   ------------------------------------------------------------------------------------------------------------------  Chemistries  Recent Labs  Lab 02/12/18 0124  NA 142  K 4.8  CL 114*  CO2 23  GLUCOSE 182*  BUN 29*  CREATININE 1.22  CALCIUM 8.3*   ------------------------------------------------------------------------------------------------------------------  Cardiac Enzymes Recent Labs  Lab 02/12/18 0124  TROPONINI <0.03   ------------------------------------------------------------------------------------------------------------------  RADIOLOGY:  Dg Chest Port 1 View  Result Date: 02/12/2018 CLINICAL DATA:  Acute onset of shortness of breath. Expiratory wheezing. EXAM: PORTABLE CHEST 1 VIEW COMPARISON:  Chest radiograph performed 10/23/2014, and CTA  of the chest performed 10/24/2014 FINDINGS: A small right pleural effusion is noted, with associated opacity. Increased interstitial markings may reflect mild interstitial edema. Alternatively, right basilar pneumonia could have a similar appearance. No pneumothorax is seen. The cardiomediastinal silhouette is borderline normal in size. No acute osseous abnormalities are identified. IMPRESSION: Small right pleural effusion noted. Increased interstitial markings may reflect mild interstitial edema. Alternatively, right basilar pneumonia could have a similar appearance. Electronically Signed   By: Roanna Raider M.D.   On: 02/12/2018 02:36    EKG:   Orders placed or performed during the hospital encounter of 02/12/18  . EKG 12-Lead  . EKG 12-Lead    IMPRESSION AND PLAN:  Principal Problem:   Acute on chronic diastolic  CHF (congestive heart failure) (HCC) -IV Lasix given in the ED and patient has started to have significant urine output.  We will repeat his echocardiogram, and get a cardiology consult.  Continue with diuresis with another dose of IV Lasix later on this morning, then continue patient's home dose p.o. Lasix Active Problems:   Uncontrolled hypertension -IV Lasix as above has started to reduce his blood pressure.  We will continue his home antihypertensives as well as additional antihypertensives PRN for blood pressure goal less than 160/100   Diabetes (HCC) -sliding scale insulin with corresponding glucose checks   Leg wounds and diabetic foot wound -blistering leg wounds due to his edema, treatment for edema as above, wound consult for treatment of the blistering wounds as well as his healing diabetic foot wound  Chart review performed and case discussed with ED provider. Labs, imaging and/or ECG reviewed by provider and discussed with patient/family. Management plans discussed with the patient and/or family.  DVT PROPHYLAXIS: SubQ lovenox   GI PROPHYLAXIS:  None  ADMISSION STATUS: Observation  CODE STATUS: Full  TOTAL TIME TAKING CARE OF THIS PATIENT: 40 minutes.   [place] FIELDING 02/12/2018, 2:45 AM  [employer]  [phone]  CC: Primary care physician; [facility]  Note:  This document was prepared using Conservation officer, historic buildings and may include unintentional dictation errors.

## 2018-02-12 NOTE — Plan of Care (Signed)

## 2018-02-12 NOTE — Progress Notes (Addendum)
Patient ID: John Lawson, male   DOB: 04/30/1971, 47 y.o.   MRN: [id number]  ACP note  Patient present  Diagnosis: Acute on chronic diastolic congestive heart failure, anasarca, hypertension, type 2 diabetes mellitus, recent amputation bilateral first toes, and anemia.  Full CODE STATUS  Plan.  Nursing staff kept informing me that the patient was short of breath.  When I came into the room he was talking on the phone and lying almost flat.  Patient states that his weight is normally 225 pounds.  Patient states that he is gained a lot of weight recently.  Patient very swollen. Start Lasix drip.  Monitor electrolytes. Start nebulizer treatments for shortness of breath.  Time spent on ACP discussion 25 minutes Dr. Alford Highlandichard Aric Jost

## 2018-02-12 NOTE — Consult Note (Addendum)
WOC Nurse wound consult note Evaluation of [facility]. Reason for Consult: Bilateral lower extremity weeping and right foot wound Wound type: Bilateral lower legs edematous, erythematous, diagnosis of cellulitis.  Right second toe open wound area is a surgical wound from toe amputation earlier this year. Measurement: 2.7 cm x 1.2 cm x 0.3 cm Wound bed: 100% pink, clean, no odor, no drainage. Periwound: Heavily callused Dressing procedure/placement/frequency: Cleanse area, pat dry. Apply a piece of Xeroform gauze over the wound. Cover with Kerlex.  I performed this dressing today during my evaluation. Additional care plan needs:  Gentle compression with ACE wraps.  Monitor the wound area(s) for worsening of condition such as: Signs/symptoms of infection,  Increase in size,  Development of or worsening of odor, Development of pain, or increased pain at the affected locations.  Notify the medical team if any of these develop.  Thank you for the consult.  Discussed plan of care with the patient and bedside nurse.  WOC nurse will not follow at this time.  Please re-consult the WOC team if needed.  Helmut MusterSherry Lexey Fletes, RN, MSN, CWOCN, CNS-BC, pager [phone]

## 2018-02-12 NOTE — ED Triage Notes (Signed)
Pt arrived via ems with SOB due to 50 lb  fluid increase related to pt not taking his lasix in 1 week because he ran out. Pt actively expiratory wheezing. Pt also took gabapentin before arrival to ER. EMS vitals 189/109 anc CBG-161.

## 2018-02-12 NOTE — Consult Note (Signed)
Reason for Consult: Congestive heart failure leg edema Referring Physician: Dr. Leslye Peer hospitalist  John Lawson is an 47 y.o. male.  HPI: Patient is a 47 year old obese white male with diabetes hypertension peripheral vascular disease with significant toe amputations in the past done at [facility] 6 months ago the patient states that over the last week he has had shortness of breath dyspnea leg edema.  Patient complains of congestion elevated blood pressure difficult control diabetes with leg swelling she came to the emergency room for evaluation was admitted with elevated BNP and heart failure.  Patient denies previous cardiac history denies myocardial infarction denies smoking no chest pain  Past Medical History:  Diagnosis Date  . Chronic diastolic CHF (congestive heart failure) ([place])   . Diabetes mellitus without complication ([place])   . Hypertension     Past Surgical History:  Procedure Laterality Date  . SKIN DEBRIDEMENT    . SPHINCTEROTOMY    . TOE AMPUTATION      Family History  Problem Relation Age of Onset  . Diabetes Mother   . Cancer Mother   . Thyroid disease Mother   . Diabetes Father   . Diabetes Sister     Social History:  has no tobacco, alcohol, and drug history on file.  Allergies: Not on File  Medications: I have reviewed the patient's current medications.  Results for orders placed or performed during the hospital encounter of 02/12/18 (from the past 48 hour(s))  CBC     Status: Abnormal   Collection Time: 02/12/18  1:24 AM  Result Value Ref Range   WBC 8.7 3.8 - 10.6 K/uL   RBC 3.83 (L) 4.40 - 5.90 MIL/uL   Hemoglobin 10.1 (L) 13.0 - 18.0 g/dL   HCT 30.6 (L) 40.0 - 52.0 %   MCV 80.1 80.0 - 100.0 fL   MCH 26.4 26.0 - 34.0 pg   MCHC 33.0 32.0 - 36.0 g/dL   RDW 17.6 (H) 11.5 - 14.5 %   Platelets 388 150 - 440 K/uL    Comment: Performed at [facility], [address] [place], [place] [address]  Basic metabolic panel     Status: Abnormal   Collection Time: 02/12/18  1:24 AM  Result Value Ref Range   Sodium 142 135 - 145 mmol/L   Potassium 4.8 3.5 - 5.1 mmol/L   Chloride 114 (H) 98 - 111 mmol/L   CO2 23 22 - 32 mmol/L   Glucose, Bld 182 (H) 70 - 99 mg/dL   BUN 29 (H) 6 - 20 mg/dL   Creatinine, Ser 1.22 0.61 - 1.24 mg/dL   Calcium 8.3 (L) 8.9 - 10.3 mg/dL   GFR calc non Af Amer >60 >60 mL/min   GFR calc Af Amer >60 >60 mL/min    Comment: (NOTE) The eGFR has been calculated using the CKD EPI equation. This calculation has not been validated in all clinical situations. eGFR's persistently <60 mL/min signify possible Chronic Kidney Disease.    Anion gap 5 5 - 15    Comment: Performed at [facility], [address] [place], [place] [address]  Brain natriuretic peptide     Status: Abnormal   Collection Time: 02/12/18  1:24 AM  Result Value Ref Range   B Natriuretic Peptide 151.0 (H) 0.0 - 100.0 pg/mL    Comment: Performed at [facility], [address], [place], [place] [address]  Troponin I     Status: None   Collection Time: 02/12/18  1:24  AM  Result Value Ref Range   Troponin I <0.03 <0.03 ng/mL    Comment: Performed at [facility], [address] [place], [place] [address]  Basic metabolic panel     Status: Abnormal   Collection Time: 02/12/18  6:54 AM  Result Value Ref Range   Sodium 145 135 - 145 mmol/L   Potassium 4.4 3.5 - 5.1 mmol/L   Chloride 115 (H) 98 - 111 mmol/L   CO2 26 22 - 32 mmol/L   Glucose, Bld 270 (H) 70 - 99 mg/dL   BUN 32 (H) 6 - 20 mg/dL   Creatinine, Ser 1.26 (H) 0.61 - 1.24 mg/dL   Calcium 8.2 (L) 8.9 - 10.3 mg/dL   GFR calc non Af Amer >60 >60 mL/min   GFR calc Af Amer >60 >60 mL/min    Comment: (NOTE) The eGFR has been calculated using the CKD EPI equation. This calculation has not been validated in all clinical situations. eGFR's persistently <60 mL/min signify possible Chronic Kidney Disease.    Anion gap 4 (L) 5 - 15    Comment: Performed at  [facility], [address] [place], [place] [address]  CBC     Status: Abnormal   Collection Time: 02/12/18  6:54 AM  Result Value Ref Range   WBC 7.8 3.8 - 10.6 K/uL   RBC 3.49 (L) 4.40 - 5.90 MIL/uL   Hemoglobin 9.2 (L) 13.0 - 18.0 g/dL   HCT 27.9 (L) 40.0 - 52.0 %   MCV 79.9 (L) 80.0 - 100.0 fL   MCH 26.3 26.0 - 34.0 pg   MCHC 32.9 32.0 - 36.0 g/dL   RDW 17.3 (H) 11.5 - 14.5 %   Platelets 332 150 - 440 K/uL    Comment: Performed at [facility], [address] [place], [place] [address]  Glucose, capillary     Status: Abnormal   Collection Time: 02/12/18  8:38 AM  Result Value Ref Range   Glucose-Capillary 265 (H) 70 - 99 mg/dL  Glucose, capillary     Status: Abnormal   Collection Time: 02/12/18 11:27 AM  Result Value Ref Range   Glucose-Capillary 182 (H) 70 - 99 mg/dL    Dg Chest Port 1 View  Result Date: 02/12/2018 CLINICAL DATA:  Acute onset of shortness of breath. Expiratory wheezing. EXAM: PORTABLE CHEST 1 VIEW COMPARISON:  Chest radiograph performed 10/23/2014, and CTA of the chest performed 10/24/2014 FINDINGS: A small right pleural effusion is noted, with associated opacity. Increased interstitial markings may reflect mild interstitial edema. Alternatively, right basilar pneumonia could have a similar appearance. No pneumothorax is seen. The cardiomediastinal silhouette is borderline normal in size. No acute osseous abnormalities are identified. IMPRESSION: Small right pleural effusion noted. Increased interstitial markings may reflect mild interstitial edema. Alternatively, right basilar pneumonia could have a similar appearance. Electronically Signed   By: Garald Balding M.D.   On: 02/12/2018 02:36    Review of Systems  Constitutional: Positive for malaise/fatigue.  HENT: Positive for congestion.   Eyes: Negative.   Respiratory: Positive for shortness of breath.   Cardiovascular: Positive for orthopnea, leg swelling and PND.  Gastrointestinal:  Negative.   Genitourinary: Negative.   Musculoskeletal: Positive for myalgias.  Skin: Negative.   Neurological: Positive for weakness.  Endo/Heme/Allergies: Negative.   Psychiatric/Behavioral: Negative.    Blood pressure 134/77, pulse 92, temperature 97.9 F (36.6 C), temperature source Oral, resp. rate 18, height 6' 2"  (1.88 m), weight 102.1 kg, SpO2 98 %.  Physical Exam  Nursing note and vitals reviewed. Constitutional: He is oriented to person, place, and time. He appears well-developed and well-nourished.  HENT:  Head: Normocephalic and atraumatic.  Eyes: Pupils are equal, round, and reactive to light. Conjunctivae and EOM are normal.  Neck: Normal range of motion. Neck supple.  Cardiovascular: Normal rate and regular rhythm.  Murmur heard. Respiratory: Effort normal and breath sounds normal.  GI: Soft. Bowel sounds are normal.  Musculoskeletal: He exhibits edema and deformity.  Neurological: He is alert and oriented to person, place, and time. He has normal reflexes.  Skin: Skin is warm. There is erythema.  Psychiatric: He has a normal mood and affect.    Assessment/Plan: Acute congestive heart failure possibly diastolic dysfunction leg edema Obesity Diabetes Hypertension Peripheral vascular disease COPD GERD Diabetic neuropathy . Plan Agree with admit to telemetry Recommend IV Lasix diuresis Continue hypertension control Diabetes management as needed including insulin Continue leg wound dressing and management Agree with ACE inhibitor diuretics Consider antibiotic therapy Echocardiogram will be helpful for further assessment evaluation  Dwayne D Callwood 02/12/2018, 4:31 PM

## 2018-02-13 ENCOUNTER — Inpatient Hospital Stay: Payer: Medicaid Other

## 2018-02-13 DIAGNOSIS — N179 Acute kidney failure, unspecified: Secondary | ICD-10-CM | POA: Diagnosis present

## 2018-02-13 DIAGNOSIS — J9601 Acute respiratory failure with hypoxia: Secondary | ICD-10-CM | POA: Diagnosis present

## 2018-02-13 DIAGNOSIS — J9602 Acute respiratory failure with hypercapnia: Secondary | ICD-10-CM | POA: Diagnosis present

## 2018-02-13 DIAGNOSIS — I110 Hypertensive heart disease with heart failure: Secondary | ICD-10-CM | POA: Diagnosis present

## 2018-02-13 DIAGNOSIS — E8809 Other disorders of plasma-protein metabolism, not elsewhere classified: Secondary | ICD-10-CM | POA: Diagnosis present

## 2018-02-13 DIAGNOSIS — E669 Obesity, unspecified: Secondary | ICD-10-CM | POA: Diagnosis present

## 2018-02-13 DIAGNOSIS — E1165 Type 2 diabetes mellitus with hyperglycemia: Secondary | ICD-10-CM | POA: Diagnosis present

## 2018-02-13 DIAGNOSIS — E1151 Type 2 diabetes mellitus with diabetic peripheral angiopathy without gangrene: Secondary | ICD-10-CM | POA: Diagnosis present

## 2018-02-13 DIAGNOSIS — I509 Heart failure, unspecified: Secondary | ICD-10-CM

## 2018-02-13 DIAGNOSIS — I5033 Acute on chronic diastolic (congestive) heart failure: Secondary | ICD-10-CM

## 2018-02-13 DIAGNOSIS — E1121 Type 2 diabetes mellitus with diabetic nephropathy: Secondary | ICD-10-CM | POA: Diagnosis present

## 2018-02-13 DIAGNOSIS — E11622 Type 2 diabetes mellitus with other skin ulcer: Secondary | ICD-10-CM | POA: Diagnosis present

## 2018-02-13 DIAGNOSIS — N139 Obstructive and reflux uropathy, unspecified: Secondary | ICD-10-CM | POA: Diagnosis present

## 2018-02-13 DIAGNOSIS — Z89412 Acquired absence of left great toe: Secondary | ICD-10-CM | POA: Diagnosis not present

## 2018-02-13 DIAGNOSIS — R0602 Shortness of breath: Secondary | ICD-10-CM | POA: Diagnosis present

## 2018-02-13 DIAGNOSIS — T501X6A Underdosing of loop [high-ceiling] diuretics, initial encounter: Secondary | ICD-10-CM | POA: Diagnosis present

## 2018-02-13 DIAGNOSIS — J189 Pneumonia, unspecified organism: Secondary | ICD-10-CM | POA: Diagnosis present

## 2018-02-13 DIAGNOSIS — L03116 Cellulitis of left lower limb: Secondary | ICD-10-CM | POA: Diagnosis present

## 2018-02-13 DIAGNOSIS — L03115 Cellulitis of right lower limb: Secondary | ICD-10-CM | POA: Diagnosis present

## 2018-02-13 DIAGNOSIS — J441 Chronic obstructive pulmonary disease with (acute) exacerbation: Secondary | ICD-10-CM | POA: Diagnosis present

## 2018-02-13 DIAGNOSIS — Z91138 Patient's unintentional underdosing of medication regimen for other reason: Secondary | ICD-10-CM | POA: Diagnosis not present

## 2018-02-13 DIAGNOSIS — L97919 Non-pressure chronic ulcer of unspecified part of right lower leg with unspecified severity: Secondary | ICD-10-CM | POA: Diagnosis present

## 2018-02-13 DIAGNOSIS — E875 Hyperkalemia: Secondary | ICD-10-CM | POA: Diagnosis not present

## 2018-02-13 DIAGNOSIS — E1140 Type 2 diabetes mellitus with diabetic neuropathy, unspecified: Secondary | ICD-10-CM | POA: Diagnosis present

## 2018-02-13 DIAGNOSIS — L97929 Non-pressure chronic ulcer of unspecified part of left lower leg with unspecified severity: Secondary | ICD-10-CM | POA: Diagnosis present

## 2018-02-13 DIAGNOSIS — K219 Gastro-esophageal reflux disease without esophagitis: Secondary | ICD-10-CM | POA: Diagnosis present

## 2018-02-13 LAB — BLOOD GAS, ARTERIAL
ACID-BASE DEFICIT: 4.3 mmol/L — AB (ref 0.0–2.0)
BICARBONATE: 23.7 mmol/L (ref 20.0–28.0)
FIO2: 100
O2 Saturation: 93.2 %
PH ART: 7.22 — AB (ref 7.350–7.450)
PO2 ART: 81 mmHg — AB (ref 83.0–108.0)
Patient temperature: 37
pCO2 arterial: 58 mmHg — ABNORMAL HIGH (ref 32.0–48.0)

## 2018-02-13 LAB — BASIC METABOLIC PANEL
ANION GAP: 9 (ref 5–15)
ANION GAP: 5 (ref 5–15)
BUN: 42 mg/dL — AB (ref 6–20)
BUN: 45 mg/dL — ABNORMAL HIGH (ref 6–20)
CHLORIDE: 111 mmol/L (ref 98–111)
CO2: 21 mmol/L — AB (ref 22–32)
CO2: 25 mmol/L (ref 22–32)
Calcium: 8.2 mg/dL — ABNORMAL LOW (ref 8.9–10.3)
Calcium: 8 mg/dL — ABNORMAL LOW (ref 8.9–10.3)
Chloride: 113 mmol/L — ABNORMAL HIGH (ref 98–111)
Creatinine, Ser: 1.41 mg/dL — ABNORMAL HIGH (ref 0.61–1.24)
Creatinine, Ser: 1.45 mg/dL — ABNORMAL HIGH (ref 0.61–1.24)
GFR calc Af Amer: >60 mL/min (ref >60)
GFR calc non Af Amer: 57 mL/min — ABNORMAL LOW (ref >60)
GFR calc non Af Amer: 55 mL/min — ABNORMAL LOW (ref >60)
GLUCOSE: 227 mg/dL — AB (ref 70–99)
GLUCOSE: 233 mg/dL — AB (ref 70–99)
POTASSIUM: 5.3 mmol/L — AB (ref 3.5–5.1)
Potassium: 5.3 mmol/L — ABNORMAL HIGH (ref 3.5–5.1)
Sodium: 143 mmol/L (ref 135–145)
Sodium: 141 mmol/L (ref 135–145)

## 2018-02-13 LAB — GLUCOSE, CAPILLARY
GLUCOSE-CAPILLARY: 207 mg/dL — AB (ref 70–99)
Glucose-Capillary: 203 mg/dL — ABNORMAL HIGH (ref 70–99)
Glucose-Capillary: 273 mg/dL — ABNORMAL HIGH (ref 70–99)
Glucose-Capillary: 124 mg/dL — ABNORMAL HIGH (ref 70–99)

## 2018-02-13 LAB — PROCALCITONIN: Procalcitonin: 0.12 ng/mL

## 2018-02-13 LAB — MRSA PCR SCREENING: MRSA by PCR: NEGATIVE

## 2018-02-13 LAB — HIV ANTIBODY (ROUTINE TESTING W REFLEX): HIV Screen 4th Generation wRfx: NONREACTIVE

## 2018-02-13 LAB — MAGNESIUM
Magnesium: 2 mg/dL (ref 1.7–2.4)
Magnesium: 1.7 mg/dL (ref 1.7–2.4)

## 2018-02-13 LAB — PHOSPHORUS: PHOSPHORUS: 4.5 mg/dL (ref 2.5–4.6)

## 2018-02-13 LAB — HEMOGLOBIN: HEMOGLOBIN: 9.7 g/dL — AB (ref 13.0–18.0)

## 2018-02-13 MED ORDER — ALBUTEROL SULFATE HFA 108 (90 BASE) MCG/ACT IN AERS: 2.0000 | INHALATION_SPRAY | Freq: Four times a day (QID) | RESPIRATORY_TRACT | Status: DC | PRN

## 2018-02-13 MED ORDER — TRAZODONE HCL 50 MG PO TABS: 50.0000 mg | ORAL_TABLET | Freq: Every evening | ORAL | Status: DC | PRN

## 2018-02-13 MED ORDER — CHLORHEXIDINE GLUCONATE 0.12 % MT SOLN
15.0000 mL | Freq: Two times a day (BID) | OROMUCOSAL | Status: DC
  Administered 2018-02-14 – 2018-02-27 (×22): 15 mL via OROMUCOSAL
  Filled 2018-02-13 (×23): qty 15

## 2018-02-13 MED ORDER — ALBUTEROL SULFATE (2.5 MG/3ML) 0.083% IN NEBU: 2.5000 mg | INHALATION_SOLUTION | Freq: Four times a day (QID) | RESPIRATORY_TRACT | Status: DC | PRN

## 2018-02-13 MED ORDER — ORAL CARE MOUTH RINSE
15.0000 mL | Freq: Two times a day (BID) | OROMUCOSAL | Status: DC
  Administered 2018-02-14 – 2018-02-23 (×15): 15 mL via OROMUCOSAL

## 2018-02-13 MED ORDER — AZITHROMYCIN 250 MG PO TABS
500.0000 mg | ORAL_TABLET | Freq: Every day | ORAL | Status: DC
  Administered 2018-02-13 – 2018-02-16 (×4): 500 mg via ORAL
  Filled 2018-02-13 (×2): qty 1
  Filled 2018-02-13 (×2): qty 2

## 2018-02-13 MED ORDER — OXYCODONE HCL 5 MG PO TABS
5.0000 mg | ORAL_TABLET | ORAL | Status: DC | PRN
  Administered 2018-02-13 (×2): 5 mg via ORAL
  Filled 2018-02-13 (×2): qty 1

## 2018-02-13 MED ORDER — OXYCODONE HCL 5 MG PO TABS: 7.5000 mg | ORAL_TABLET | Freq: Four times a day (QID) | ORAL | Status: DC | PRN

## 2018-02-13 MED ORDER — HYDRALAZINE HCL 20 MG/ML IJ SOLN: 10.0000 mg | Freq: Four times a day (QID) | INTRAMUSCULAR | Status: DC | PRN

## 2018-02-13 MED ORDER — SODIUM CHLORIDE 0.9 % IV SOLN
1.0000 g | Freq: Every day | INTRAVENOUS | Status: DC
  Administered 2018-02-13 – 2018-02-16 (×4): 1 g via INTRAVENOUS
  Filled 2018-02-13 (×4): qty 1

## 2018-02-13 MED ORDER — OXYCODONE HCL 5 MG PO TABS
5.0000 mg | ORAL_TABLET | Freq: Once | ORAL | Status: AC
  Administered 2018-02-13: 5 mg via ORAL
  Filled 2018-02-13: qty 1

## 2018-02-13 MED ORDER — OXYCODONE HCL 5 MG PO TABS: 5.0000 mg | ORAL_TABLET | Freq: Three times a day (TID) | ORAL | Status: DC | PRN

## 2018-02-13 MED ORDER — METHYLPREDNISOLONE SODIUM SUCC 125 MG IJ SOLR
60.0000 mg | Freq: Two times a day (BID) | INTRAMUSCULAR | Status: DC
  Administered 2018-02-13 – 2018-02-14 (×3): 60 mg via INTRAVENOUS
  Filled 2018-02-13 (×3): qty 2

## 2018-02-13 NOTE — Progress Notes (Signed)
Bipap on standby at this time. Pt on 4L [place] with sats of 100%

## 2018-02-13 NOTE — Consult Note (Signed)
Pharmacy Antibiotic Note  John Lawson is a 47 y.o. male admitted on 02/12/2018 with pneumonia.  Pharmacy has been consulted for Levaquin dosing.  Plan: Switched to Azithromycin + Ceftriaxone per Dr Allena KatzPatel. Procalcitonin ordered,  Height: 6\' 2"  (188 cm) Weight: (!) 309 lb 6.4 oz (140.3 kg) IBW/kg (Calculated) : 82.2  Temp (24hrs), Avg:98 F (36.7 C), Min:97.8 F (36.6 C), Max:98.3 F (36.8 C)  Recent Labs  Lab 02/12/18 0124 02/12/18 0654 02/13/18 0334  WBC 8.7 7.8  --   CREATININE 1.22 1.26* 1.41*    Estimated Creatinine Clearance: 94.5 mL/min (A) (by C-G formula based on SCr of 1.41 mg/dL (H)).    Not on File  Antimicrobials this admission: Ceftriaxone 0810 >>  Azithromycin 0810 >>   Will continue to monitor.  Thank you for allowing pharmacy to be a part of this patient's care.  Mauri ReadingSavanna M Rion Catala, PharmD Pharmacy Resident  02/13/2018 11:32 AM

## 2018-02-13 NOTE — Progress Notes (Signed)
Pt. Requesting something to eat prior to having Bipap on O/N.  Discussed with Dr. Duanne LimerickSamaan, given permission to have something to eat prior to Bipap use O/N

## 2018-02-13 NOTE — Progress Notes (Signed)
Patient was incontinent for large amount of urine.  Found his personal inhaler in his bed.  Cursing at me to give it to him.  Says he doesn't care that it's covered in urine.  Asked Dr. Allena KatzPatel if he can use his own inhaler - he agreed.   Albuterol 108 MCG/ACT inhaler.   He just used 3 puffs.  Following the budesonide neb and iprtropium and albuterold neb treatments he had at 0746.

## 2018-02-13 NOTE — Progress Notes (Signed)
Decreased FIO2 to 30% 

## 2018-02-13 NOTE — Consult Note (Signed)
Name: John RiggsMichael Adney MRN: [id number] DOB: 12/09/1970     CONSULTATION DATE: 02/12/2018  Subjective & objective:   Patient arrived to the intensive care unit awake in no distress on BiPAP 14/6 40% and Lasix drip 10 mg/h.  Foley catheter was placed, 1350 of urine output was collected. All history was obtained from the hospitalist, the patient and EMR.  PAST MEDICAL HISTORY :   has a past medical history of Chronic diastolic CHF (congestive heart failure) (HCC), Diabetes mellitus without complication (HCC), and Hypertension.  has a past surgical history that includes Toe amputation; Sphincterotomy; and Skin debridement. Prior to Admission medications   Medication Sig Start Date End Date Taking? Authorizing Provider  albuterol (PROVENTIL HFA;VENTOLIN HFA) 108 (90 Base) MCG/ACT inhaler Inhale 2 puffs into the lungs every 6 (six) hours as needed for wheezing.    [provider]  cholestyramine (QUESTRAN) 4 g packet Take 4 g by mouth 2 (two) times daily.    [provider]  furosemide (LASIX) 80 MG tablet Take 80 mg by mouth 2 (two) times daily.    [provider]  gabapentin (NEURONTIN) 800 MG tablet Take 800 mg by mouth 3 (three) times daily.    [provider]  insulin glargine (LANTUS) 100 unit/mL SOPN Inject 28 Units into the skin at bedtime.    [provider]  lisinopril (PRINIVIL,ZESTRIL) 10 MG tablet Take 10 mg by mouth daily.    [provider]  metFORMIN (GLUCOPHAGE-XR) 500 MG 24 hr tablet Take 2,000 mg by mouth daily with breakfast.    [provider]  Multiple Vitamin (MULTIVITAMIN WITH MINERALS) TABS tablet Take 1 tablet by mouth daily.    [provider]  naproxen (NAPROSYN) 500 MG tablet Take 500 mg by mouth 2 (two) times daily as needed for mild pain.    [provider]  omeprazole (PRILOSEC) 20 MG capsule Take 40 mg by mouth 2 (two) times daily before a meal.    [provider]    spironolactone (ALDACTONE) 25 MG tablet Take 25 mg by mouth daily.    [provider]  traZODone (DESYREL) 150 MG tablet Take 150 mg by mouth at bedtime as needed for sleep.    [provider]   Not on File  FAMILY HISTORY:  family history includes Cancer in his mother; Diabetes in his father, mother, and sister; Thyroid disease in his mother. SOCIAL HISTORY:  reports that he has never smoked. He has never used smokeless tobacco.  REVIEW OF SYSTEMS:   Unable to obtain due to critical illness   VITAL SIGNS: Temp:  [97.8 F (36.6 C)-98.5 F (36.9 C)] 98.5 F (36.9 C) (08/10 1215) Pulse Rate:  [86-108] 104 (08/10 1215) Resp:  [16-48] 24 (08/10 1215) BP: (123-152)/(61-99) 133/88 (08/10 1215) SpO2:  [65 %-100 %] 99 % (08/10 1215) FiO2 (%):  [40 %] 40 % (08/10 1215) Weight:  [140.3 kg-144 kg] 144 kg (08/10 1215)  Physical Examination:  Awake and oriented with no focal motor deficits Tolerating BiPAP 14/6 40%, no distress, bilateral equal air entry with diffuse medium crackles S1 & S2 are audible with no murmur Benign abdominal exam with feeble peristalsis.  Scrotal edema 2+ Bilateral leg edema 2+, bilateral leg cellulitis, dressed right lower extremity   ASSESSMENT / PLAN:  Acute respiratory failure with hypoxemia and hypercarbia, tolerating BiPAP 14/6 40%. -Monitor work of breathing O2 sat and ABG -Consider intubation if no improvement   CHF with pulmonary congestion, leg edema, scrotal  edema and right pleural effusion -Optimize diuresis, currently Lasix drip and monitor renal panel -Follow-up with echocardiogram and a cardiology consult  Atelectasis with possible pneumonia as infective etiology cannot be ruled out.  Right lower airspace disease -Zithromax + Rocephin.  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  On the Rocephin -Wound care  Acute urine retention.  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  Continue with supportive care  Critical care time 45 minutes

## 2018-02-13 NOTE — Progress Notes (Signed)
Called by RN in reference to pt in distress.  Arrived to find patient on 10lpm via [place] with SpO2 90-92%.  Rales audible from bedside with increased WOB.  Discussed situation with MD and recommended initiation of BIPAP with transfer to CCU.  Pt placed on NRB for the interim.  RT will continue to monitor.

## 2018-02-13 NOTE — Progress Notes (Signed)
1215 Received patient from 2A  in his bed. 100% NRB mask on and pale. Obese,edematous 47 year old male with multiple skin abrasions. Scrotal edema + 6 . Report given including patient had [facility]. Placed on BiPAP at 40%.Foley placed with great difficulty due to scrotal edema. Immediately 1400 ml of clear yellow urine was retrieved. Patient breathing better.

## 2018-02-13 NOTE — Progress Notes (Signed)
Scrotal edema is so severe that I can't visualize his penis.

## 2018-02-13 NOTE — Progress Notes (Signed)
Patient refuses his bed alarm, he refuses to call for help when he gets up.  He called from the BR.  He had taken his telemetry off, and removed his oxygen.  He was extremely SOB, stated he was unable to stand.  There was a trail of BM from the bed to the bathroom.  He says he called and no one came.   His alarm only rang from the bathroom.  I told him this is terribly dangerous.  Showed him on the monitor that he oxygen saturation was 65 and that above 90 is normal.  Stressed that he needs to call us when he needs to get up.  BSC placed at bedside.  After several minutes in bed his O2 sats are only 85 on 4L [place].  Increased to 5L.  Respiratory called to evaluate for NRB or hi flow

## 2018-02-13 NOTE — Progress Notes (Signed)
[employer] [facility] [facility] [place] [facility]                                                                                                                                                                                  Patient Demographics   John Lawson, is a 47 y.o. male, DOB - 1971-06-28, ZOX:[id number]  Admit date - 02/12/2018   Admitting Physician Oralia Manis, MD  Outpatient Primary MD for the patient is [facility]   LOS - 0  Subjective: Patient continues to be short of breath according to nurse little drowsy. He has [place] fried chicken next to his bed as well as drinking Dr. Reino Kent complains of pain in his back   Review of Systems:   CONSTITUTIONAL: No documented fever. No fatigue, weakness. No weight gain, no weight loss.  EYES: No blurry or double vision.  ENT: No tinnitus. No postnasal drip. No redness of the oropharynx.  RESPIRATORY: No cough, no wheeze, no hemoptysis.  Positive dyspnea.  CARDIOVASCULAR: No chest pain. No orthopnea. No palpitations. No syncope.  Positive edema GASTROINTESTINAL: No nausea, no vomiting or diarrhea. No abdominal pain. No melena or hematochezia.  GENITOURINARY: No dysuria or hematuria.  ENDOCRINE: No polyuria or nocturia. No heat or cold intolerance.  HEMATOLOGY: No anemia. No bruising. No bleeding.  INTEGUMENTARY: No rashes. No lesions.  MUSCULOSKELETAL: No arthritis. No swelling. No gout.  NEUROLOGIC: No numbness, tingling, or ataxia. No seizure-type activity.  PSYCHIATRIC: No anxiety. No insomnia. No ADD.    Vitals:   Vitals:   02/13/18 0818 02/13/18 1027 02/13/18 1032 02/13/18 1043  BP: (!) 152/99   (!) 150/61  Pulse: 96  (!) 107 (!) 108  Resp: 20 (!) 48    Temp:      TempSrc:      SpO2: 95% (!) 65% (!) 85% 92%  Weight:      Height:        Wt Readings from Last 3 Encounters:  02/13/18 (!) 140.3 kg     Intake/Output Summary (Last 24 hours) at 02/13/2018 1050 Last data filed at  02/12/2018 2228 Gross per 24 hour  Intake 393.6 ml  Output 1800 ml  Net -1406.4 ml    Physical Exam:   GENERAL: Pleasant-appearing in no apparent distress.  HEAD, EYES, EARS, NOSE AND THROAT: Atraumatic, normocephalic. Extraocular muscles are intact. Pupils equal and reactive to light. Sclerae anicteric. No conjunctival injection. No oro-pharyngeal erythema.  NECK: Supple. There is no jugular venous distention. No bruits, no lymphadenopathy, no thyromegaly.  HEART: Regular rate and rhythm,. No murmurs, no rubs, no clicks.  LUNGS: Crackles at both bases with accessory muscle usage ABDOMEN: Soft,  flat, nontender, nondistended. Has good bowel sounds. No hepatosplenomegaly appreciated.  EXTREMITIES: Positive edema in both lower extremity NEUROLOGIC: The patient is drowsy but able to answer questions SKIN: Moist and warm with no rashes appreciated.  Psych: Not anxious, depressed LN: No inguinal LN enlargement    Antibiotics   Anti-infectives (From admission, onward)   None      Medications   Scheduled Meds: . budesonide (PULMICORT) nebulizer solution  0.5 mg Nebulization BID  . enoxaparin (LOVENOX) injection  40 mg Subcutaneous Q24H  . gabapentin  800 mg Oral TID  . insulin aspart  0-5 Units Subcutaneous QHS  . insulin aspart  0-9 Units Subcutaneous TID WC  . insulin glargine  20 Units Subcutaneous QHS  . ipratropium-albuterol  3 mL Nebulization Q6H  . lidocaine  2 patch Transdermal Q24H  . lisinopril  10 mg Oral Daily  . pantoprazole  40 mg Oral BID  . potassium chloride  40 mEq Oral BID  . spironolactone  25 mg Oral Daily   Continuous Infusions: . furosemide (LASIX) infusion 8 mg/hr (02/12/18 1348)   PRN Meds:.acetaminophen **OR** acetaminophen, albuterol, ondansetron **OR** ondansetron (ZOFRAN) IV, oxyCODONE   Data Review:   Micro Results No results found for this or any previous visit (from the past 240 hour(s)).  Radiology Reports Dg Chest Port 1 View  Result  Date: 02/12/2018 CLINICAL DATA:  Acute onset of shortness of breath. Expiratory wheezing. EXAM: PORTABLE CHEST 1 VIEW COMPARISON:  Chest radiograph performed 10/23/2014, and CTA of the chest performed 10/24/2014 FINDINGS: A small right pleural effusion is noted, with associated opacity. Increased interstitial markings may reflect mild interstitial edema. Alternatively, right basilar pneumonia could have a similar appearance. No pneumothorax is seen. The cardiomediastinal silhouette is borderline normal in size. No acute osseous abnormalities are identified. IMPRESSION: Small right pleural effusion noted. Increased interstitial markings may reflect mild interstitial edema. Alternatively, right basilar pneumonia could have a similar appearance. Electronically Signed   By: Roanna RaiderJeffery  Chang M.D.   On: 02/12/2018 02:36     CBC Recent Labs  Lab 02/12/18 0124 02/12/18 0654 02/13/18 0334  WBC 8.7 7.8  --   HGB 10.1* 9.2* 9.7*  HCT 30.6* 27.9*  --   PLT 388 332  --   MCV 80.1 79.9*  --   MCH 26.4 26.3  --   MCHC 33.0 32.9  --   RDW 17.6* 17.3*  --     Chemistries  Recent Labs  Lab 02/12/18 0124 02/12/18 0654 02/13/18 0334  NA 142 145 143  K 4.8 4.4 5.3*  CL 114* 115* 113*  CO2 23 26 21*  GLUCOSE 182* 270* 227*  BUN 29* 32* 42*  CREATININE 1.22 1.26* 1.41*  CALCIUM 8.3* 8.2* 8.2*  MG  --   --  2.0   ------------------------------------------------------------------------------------------------------------------ estimated creatinine clearance is 94.5 mL/min (A) (by C-G formula based on SCr of 1.41 mg/dL (H)). ------------------------------------------------------------------------------------------------------------------ No results for input(s): HGBA1C in the last 72 hours. ------------------------------------------------------------------------------------------------------------------ No results for input(s): CHOL, HDL, LDLCALC, TRIG, CHOLHDL, LDLDIRECT in the last 72  hours. ------------------------------------------------------------------------------------------------------------------ No results for input(s): TSH, T4TOTAL, T3FREE, THYROIDAB in the last 72 hours.  Invalid input(s): FREET3 ------------------------------------------------------------------------------------------------------------------ Recent Labs    02/12/18 1645  FERRITIN 36    Coagulation profile No results for input(s): INR, PROTIME in the last 168 hours.  No results for input(s): DDIMER in the last 72 hours.  Cardiac Enzymes Recent Labs  Lab 02/12/18 0124  TROPONINI <0.03   ------------------------------------------------------------------------------------------------------------------ Invalid input(s):  POCBNP    Assessment & Plan  Patient is 47 year old noncompliant with his CHF regimen presenting with worsening shortness of breath and swelling   1. Acute on chronic diastolic CHF (congestive heart failure) (HCC) - Continue IV Lasix I will increase the dose to 10 mg/h Strongly recommended patient adhere to his diet and fluid intake  2.    Accelerated hypertension Continue lisinopril add IV hydralazine as needed  3.  COPD with possible exacerbation continue nebulizer therapy. I will add steroids to his current regimen  4.   Diabetes (HCC) -sliding scale insulin with corresponding glucose checks    5.  Leg wounds and diabetic foot wound -blistering leg wounds due to his edema, treatment for edema as above, wound consult for treatment of the blistering wounds as well as his healing diabetic foot wound  6.  GERD continue Protonix      Code Status Orders  (From admission, onward)         Start     Ordered   02/12/18 0339  Full code  Continuous     02/12/18 0338        Code Status History    This patient has a current code status but no historical code status.           Consults cardiology  DVT Prophylaxis  Lovenox  Lab Results   Component Value Date   PLT 332 02/12/2018     Time Spent in minutes   Greater than 50% of time spent in care coordination and counseling patient regarding the condition and plan of care.   Auburn Bilberry M.D on 02/13/2018 at 10:50 AM  Between 7am to 6pm - Pager - [phone]  After 6pm go to [email or web address] - Social research officer, government  [employer]   Office  [phone]

## 2018-02-13 NOTE — Progress Notes (Signed)
[employer] - [place] at [facility]                                                                                                                                                                                  Patient Demographics   John Lawson, is a 47 y.o. male, DOB - 1971-01-19, BJY:[id number]  Admit date - 02/12/2018   Admitting Physician Oralia Manis, MD  Outpatient Primary MD for the patient is [facility]   LOS - 0  Subjective: Patient walked to the bathroom took his oxygen off.  Subsequently noted to be hypoxic Patient's oxygen was in the 60s.  Even with 10 L he is having trouble with breathing.  Review of Systems:   CONSTITUTIONAL: No documented fever. No fatigue, weakness. No weight gain, no weight loss.  EYES: No blurry or double vision.  ENT: No tinnitus. No postnasal drip. No redness of the oropharynx.  RESPIRATORY: No cough, no wheeze, no hemoptysis.  Positive dyspnea.  CARDIOVASCULAR: No chest pain. No orthopnea. No palpitations. No syncope.  Positive edema GASTROINTESTINAL: No nausea, no vomiting or diarrhea. No abdominal pain. No melena or hematochezia.  GENITOURINARY: No dysuria or hematuria.  ENDOCRINE: No polyuria or nocturia. No heat or cold intolerance.  HEMATOLOGY: No anemia. No bruising. No bleeding.  INTEGUMENTARY: No rashes. No lesions.  MUSCULOSKELETAL: No arthritis. No swelling. No gout.  NEUROLOGIC: No numbness, tingling, or ataxia. No seizure-type activity.  PSYCHIATRIC: No anxiety. No insomnia. No ADD.    Vitals:   Vitals:   02/13/18 0818 02/13/18 1027 02/13/18 1032 02/13/18 1043  BP: (!) 152/99   (!) 150/61  Pulse: 96  (!) 107 (!) 108  Resp: 20 (!) 48    Temp:      TempSrc:      SpO2: 95% (!) 65% (!) 85% 92%  Weight:      Height:        Wt Readings from Last 3 Encounters:  02/13/18 (!) 140.3 kg     Intake/Output Summary (Last 24 hours) at 02/13/2018 1059 Last data filed at 02/12/2018  2228 Gross per 24 hour  Intake 393.6 ml  Output 1800 ml  Net -1406.4 ml    Physical Exam:   GENERAL: Accessory muscle usage HEAD, EYES, EARS, NOSE AND THROAT: Atraumatic, normocephalic. Extraocular muscles are intact. Pupils equal and reactive to light. Sclerae anicteric. No conjunctival injection. No oro-pharyngeal erythema.  NECK: Supple. There is no jugular venous distention. No bruits, no lymphadenopathy, no thyromegaly.  HEART: Regular rate and rhythm,. No murmurs, no rubs, no clicks.  LUNGS: Crackles at both bases with accessory muscle usage ABDOMEN: Soft, flat, nontender, nondistended. Has good  bowel sounds. No hepatosplenomegaly appreciated.  EXTREMITIES: Positive edema in both lower extremity NEUROLOGIC: Drowsy SKIN: Moist and warm with no rashes appreciated.  Psych: Not anxious, depressed LN: No inguinal LN enlargement    Antibiotics   Anti-infectives (From admission, onward)   None      Medications   Scheduled Meds: . budesonide (PULMICORT) nebulizer solution  0.5 mg Nebulization BID  . enoxaparin (LOVENOX) injection  40 mg Subcutaneous Q24H  . gabapentin  800 mg Oral TID  . insulin aspart  0-5 Units Subcutaneous QHS  . insulin aspart  0-9 Units Subcutaneous TID WC  . insulin glargine  20 Units Subcutaneous QHS  . ipratropium-albuterol  3 mL Nebulization Q6H  . lidocaine  2 patch Transdermal Q24H  . lisinopril  10 mg Oral Daily  . methylPREDNISolone (SOLU-MEDROL) injection  60 mg Intravenous Q12H  . pantoprazole  40 mg Oral BID  . potassium chloride  40 mEq Oral BID  . spironolactone  25 mg Oral Daily   Continuous Infusions: . furosemide (LASIX) infusion 8 mg/hr (02/12/18 1348)   PRN Meds:.acetaminophen **OR** acetaminophen, albuterol, ondansetron **OR** ondansetron (ZOFRAN) IV, oxyCODONE   Data Review:   Micro Results No results found for this or any previous visit (from the past 240 hour(s)).  Radiology Reports Dg Chest Port 1 View  Result  Date: 02/12/2018 CLINICAL DATA:  Acute onset of shortness of breath. Expiratory wheezing. EXAM: PORTABLE CHEST 1 VIEW COMPARISON:  Chest radiograph performed 10/23/2014, and CTA of the chest performed 10/24/2014 FINDINGS: A small right pleural effusion is noted, with associated opacity. Increased interstitial markings may reflect mild interstitial edema. Alternatively, right basilar pneumonia could have a similar appearance. No pneumothorax is seen. The cardiomediastinal silhouette is borderline normal in size. No acute osseous abnormalities are identified. IMPRESSION: Small right pleural effusion noted. Increased interstitial markings may reflect mild interstitial edema. Alternatively, right basilar pneumonia could have a similar appearance. Electronically Signed   By: Roanna Raider M.D.   On: 02/12/2018 02:36     CBC Recent Labs  Lab 02/12/18 0124 02/12/18 0654 02/13/18 0334  WBC 8.7 7.8  --   HGB 10.1* 9.2* 9.7*  HCT 30.6* 27.9*  --   PLT 388 332  --   MCV 80.1 79.9*  --   MCH 26.4 26.3  --   MCHC 33.0 32.9  --   RDW 17.6* 17.3*  --     Chemistries  Recent Labs  Lab 02/12/18 0124 02/12/18 0654 02/13/18 0334  NA 142 145 143  K 4.8 4.4 5.3*  CL 114* 115* 113*  CO2 23 26 21*  GLUCOSE 182* 270* 227*  BUN 29* 32* 42*  CREATININE 1.22 1.26* 1.41*  CALCIUM 8.3* 8.2* 8.2*  MG  --   --  2.0   ------------------------------------------------------------------------------------------------------------------ estimated creatinine clearance is 94.5 mL/min (A) (by C-G formula based on SCr of 1.41 mg/dL (H)). ------------------------------------------------------------------------------------------------------------------ No results for input(s): HGBA1C in the last 72 hours. ------------------------------------------------------------------------------------------------------------------ No results for input(s): CHOL, HDL, LDLCALC, TRIG, CHOLHDL, LDLDIRECT in the last 72  hours. ------------------------------------------------------------------------------------------------------------------ No results for input(s): TSH, T4TOTAL, T3FREE, THYROIDAB in the last 72 hours.  Invalid input(s): FREET3 ------------------------------------------------------------------------------------------------------------------ Recent Labs    02/12/18 1645  FERRITIN 36    Coagulation profile No results for input(s): INR, PROTIME in the last 168 hours.  No results for input(s): DDIMER in the last 72 hours.  Cardiac Enzymes Recent Labs  Lab 02/12/18 0124  TROPONINI <0.03   ------------------------------------------------------------------------------------------------------------------ Invalid input(s): POCBNP  Assessment & Plan  Patient is 47 year old noncompliant with his CHF regimen presenting with worsening shortness of breath and swelling   1.  Acute hypoxic respiratory failure I check a stat ABG Patient will need transfer to the stepdown unit and BiPAP I have discussed with the intensivist regarding transfer   2.Acute on chronic diastolic CHF (congestive heart failure) (HCC) - Continue IV Lasix I will increase the dose to 10 mg/h Strongly recommended patient adhere to his diet and fluid intake  3.    Accelerated hypertension Continue lisinopril add IV hydralazine as needed  4  COPD with possible exacerbation continue nebulizer therapy. I will add steroids to his current regimen  5  Diabetes (HCC) -sliding scale insulin with corresponding glucose checks    6  Leg wounds and diabetic foot wound -blistering leg wounds due to his edema, treatment for edema as above, wound consult for treatment of the blistering wounds as well as his healing diabetic foot wound  7.  GERD continue Protonix      Code Status Orders  (From admission, onward)         Start     Ordered   02/12/18 0339  Full code  Continuous     02/12/18 0338        Code  Status History    This patient has a current code status but no historical code status.           Consults cardiology  DVT Prophylaxis  Lovenox  Lab Results  Component Value Date   PLT 332 02/12/2018     Time Spent in minutes   additional 45 minutes of critical care time spent  Auburn BilberryShreyang Tion Tse M.D on 02/13/2018 at 10:59 AM  Between 7am to 6pm - Pager - [phone]  After 6pm go to [email or web address] - Social research officer, governmentpassword [employer]AS [email or web address]  [employer]   Office  [phone]

## 2018-02-13 NOTE — Progress Notes (Signed)
Transferred to ICU 06 for continuous Bipap tx.

## 2018-02-14 ENCOUNTER — Inpatient Hospital Stay: Payer: Medicaid Other

## 2018-02-14 DIAGNOSIS — J9601 Acute respiratory failure with hypoxia: Secondary | ICD-10-CM

## 2018-02-14 LAB — BASIC METABOLIC PANEL
ANION GAP: 7 (ref 5–15)
ANION GAP: 7 (ref 5–15)
BUN: 46 mg/dL — ABNORMAL HIGH (ref 6–20)
BUN: 45 mg/dL — AB (ref 6–20)
CALCIUM: 8.2 mg/dL — AB (ref 8.9–10.3)
CHLORIDE: 109 mmol/L (ref 98–111)
CHLORIDE: 107 mmol/L (ref 98–111)
CO2: 24 mmol/L (ref 22–32)
CO2: 25 mmol/L (ref 22–32)
Calcium: 8.1 mg/dL — ABNORMAL LOW (ref 8.9–10.3)
Creatinine, Ser: 1.37 mg/dL — ABNORMAL HIGH (ref 0.61–1.24)
Creatinine, Ser: 1.41 mg/dL — ABNORMAL HIGH (ref 0.61–1.24)
GFR calc Af Amer: >60 mL/min (ref >60)
GFR calc non Af Amer: 57 mL/min — ABNORMAL LOW (ref >60)
GFR, EST NON AFRICAN AMERICAN: 59 mL/min — AB (ref >60)
GLUCOSE: 229 mg/dL — AB (ref 70–99)
Glucose, Bld: 367 mg/dL — ABNORMAL HIGH (ref 70–99)
POTASSIUM: 5.4 mmol/L — AB (ref 3.5–5.1)
Potassium: 5.2 mmol/L — ABNORMAL HIGH (ref 3.5–5.1)
SODIUM: 139 mmol/L (ref 135–145)
Sodium: 140 mmol/L (ref 135–145)

## 2018-02-14 LAB — MAGNESIUM
MAGNESIUM: 1.9 mg/dL (ref 1.7–2.4)
Magnesium: 1.8 mg/dL (ref 1.7–2.4)

## 2018-02-14 LAB — GLUCOSE, CAPILLARY
GLUCOSE-CAPILLARY: 385 mg/dL — AB (ref 70–99)
GLUCOSE-CAPILLARY: 313 mg/dL — AB (ref 70–99)
GLUCOSE-CAPILLARY: 245 mg/dL — AB (ref 70–99)
Glucose-Capillary: 311 mg/dL — ABNORMAL HIGH (ref 70–99)

## 2018-02-14 LAB — PHOSPHORUS
PHOSPHORUS: 4.3 mg/dL (ref 2.5–4.6)
Phosphorus: 4 mg/dL (ref 2.5–4.6)

## 2018-02-14 MED ORDER — OXYCODONE-ACETAMINOPHEN 7.5-325 MG PO TABS
1.0000 | ORAL_TABLET | Freq: Three times a day (TID) | ORAL | Status: DC | PRN
  Filled 2018-02-14: qty 1

## 2018-02-14 MED ORDER — OXYCODONE HCL 5 MG PO TABS
7.5000 mg | ORAL_TABLET | Freq: Three times a day (TID) | ORAL | Status: DC | PRN
  Administered 2018-02-14 – 2018-02-15 (×3): 7.5 mg via ORAL
  Filled 2018-02-14 (×4): qty 2

## 2018-02-14 MED ORDER — OXYCODONE HCL 5 MG PO TABS
5.0000 mg | ORAL_TABLET | Freq: Three times a day (TID) | ORAL | Status: DC | PRN
  Administered 2018-02-14 (×2): 5 mg via ORAL
  Filled 2018-02-14 (×2): qty 1

## 2018-02-14 NOTE — Progress Notes (Signed)
[employer] - [place] at [facility]                                                                                                                                                                                  Patient Demographics   John Lawson, is a 47 y.o. male, DOB - May 02, 1971, [id number]  Admit date - 02/12/2018   Admitting Physician Oralia Manis, MD  Outpatient Primary MD for the patient is [facility]   LOS - 1  Subjective: Patient's breathing improved no chest pain Review of Systems:   CONSTITUTIONAL: No documented fever. No fatigue, weakness. No weight gain, no weight loss.  EYES: No blurry or double vision.  ENT: No tinnitus. No postnasal drip. No redness of the oropharynx.  RESPIRATORY: No cough, no wheeze, no hemoptysis.  Positive dyspnea.  CARDIOVASCULAR: No chest pain. No orthopnea. No palpitations. No syncope.  Positive edema GASTROINTESTINAL: No nausea, no vomiting or diarrhea. No abdominal pain. No melena or hematochezia.  GENITOURINARY: No dysuria or hematuria.  ENDOCRINE: No polyuria or nocturia. No heat or cold intolerance.  HEMATOLOGY: No anemia. No bruising. No bleeding.  INTEGUMENTARY: No rashes. No lesions.  MUSCULOSKELETAL: No arthritis. No swelling. No gout.  NEUROLOGIC: No numbness, tingling, or ataxia. No seizure-type activity.  PSYCHIATRIC: No anxiety. No insomnia. No ADD.    Vitals:   Vitals:   02/14/18 0600 02/14/18 0700 02/14/18 0800 02/14/18 0855  BP: 135/66 133/64    Pulse: (!) 102 (!) 101    Resp: 17 17    Temp:   98.5 F (36.9 C)   TempSrc:   Oral   SpO2: 96% 92%  98%  Weight:      Height:        Wt Readings from Last 3 Encounters:  02/13/18 (!) 144 kg     Intake/Output Summary (Last 24 hours) at 02/14/2018 1517 Last data filed at 02/14/2018 0800 Gross per 24 hour  Intake 1329.93 ml  Output 2545 ml  Net -1215.07 ml    Physical Exam:   GENERAL: Accessory muscle usage HEAD,  EYES, EARS, NOSE AND THROAT: Atraumatic, normocephalic. Extraocular muscles are intact. Pupils equal and reactive to light. Sclerae anicteric. No conjunctival injection. No oro-pharyngeal erythema.  NECK: Supple. There is no jugular venous distention. No bruits, no lymphadenopathy, no thyromegaly.  HEART: Regular rate and rhythm,. No murmurs, no rubs, no clicks.  LUNGS: Crackles at both bases with accessory muscle usage ABDOMEN: Soft, flat, nontender, nondistended. Has good bowel sounds. No hepatosplenomegaly appreciated.  EXTREMITIES: Positive edema in both lower extremity NEUROLOGIC: Drowsy SKIN: Moist and warm with no rashes appreciated.  Psych: Not anxious, depressed LN:  No inguinal LN enlargement    Antibiotics   Anti-infectives (From admission, onward)   Start     Dose/Rate Route Frequency Ordered Stop   02/13/18 1200  cefTRIAXone (ROCEPHIN) 1 g in sodium chloride 0.9 % 100 mL IVPB     1 g 200 mL/hr over 30 Minutes Intravenous Daily 02/13/18 1125     02/13/18 1200  azithromycin (ZITHROMAX) tablet 500 mg     500 mg Oral Daily 02/13/18 1125        Medications   Scheduled Meds: . azithromycin  500 mg Oral Daily  . budesonide (PULMICORT) nebulizer solution  0.5 mg Nebulization BID  . chlorhexidine  15 mL Mouth Rinse BID  . enoxaparin (LOVENOX) injection  40 mg Subcutaneous Q24H  . gabapentin  800 mg Oral TID  . insulin aspart  0-5 Units Subcutaneous QHS  . insulin aspart  0-9 Units Subcutaneous TID WC  . insulin glargine  20 Units Subcutaneous QHS  . ipratropium-albuterol  3 mL Nebulization Q6H  . lidocaine  2 patch Transdermal Q24H  . lisinopril  10 mg Oral Daily  . mouth rinse  15 mL Mouth Rinse q12n4p  . methylPREDNISolone (SOLU-MEDROL) injection  60 mg Intravenous Q12H  . pantoprazole  40 mg Oral BID  . spironolactone  25 mg Oral Daily   Continuous Infusions: . cefTRIAXone (ROCEPHIN)  IV Stopped (02/14/18 1024)  . furosemide (LASIX) infusion 4 mg/hr (02/14/18 0800)    PRN Meds:.acetaminophen **OR** acetaminophen, albuterol, hydrALAZINE, ondansetron **OR** ondansetron (ZOFRAN) IV, oxyCODONE   Data Review:   Micro Results Recent Results (from the past 240 hour(s))  MRSA PCR Screening     Status: None   Collection Time: 02/13/18 12:23 PM  Result Value Ref Range Status   MRSA by PCR NEGATIVE NEGATIVE Final    Comment:        The GeneXpert MRSA Assay (FDA approved for NASAL specimens only), is one component of a comprehensive MRSA colonization surveillance program. It is not intended to diagnose MRSA infection nor to guide or monitor treatment for MRSA infections. Performed at [facility], [address] [place], [place] [address]     Radiology Reports [place] Renal  Result Date: 02/13/2018 CLINICAL DATA:  Acute kidney insufficiency EXAM: RENAL / URINARY TRACT ULTRASOUND COMPLETE COMPARISON:  None. FINDINGS: Right Kidney: Length: 12.9 cm. There is diffuse increased echotexture of the right kidney. No mass or hydronephrosis visualized. Left Kidney: Length: 14.5 cm. Echogenicity within normal limits. No mass or hydronephrosis visualized. Bladder: Evaluation of bladder is limited as patient has Foley catheter in place. IMPRESSION: Diffuse increased echotexture of the right kidney. This is nonspecific but can be seen in medical renal disease. No hydronephrosis identified bilaterally. Electronically Signed   By: Sherian ReinWei-Chen  Lin M.D.   On: 02/13/2018 16:36   Dg Chest Port 1 View  Result Date: 02/14/2018 CLINICAL DATA:  Atelectasis EXAM: PORTABLE CHEST 1 VIEW COMPARISON:  02/12/2018 FINDINGS: Increased interstitial markings with a perihilar distribution, increased, favoring mild interstitial edema. Small right pleural effusion. Associated right basilar opacity, likely atelectasis, unchanged. No pneumothorax. Cardiomegaly. IMPRESSION: Cardiomegaly with mild interstitial edema, increased. Small right pleural effusion. Associated right lower lobe  opacity, likely atelectasis, unchanged. Electronically Signed   By: Charline BillsSriyesh  Krishnan M.D.   On: 02/14/2018 07:16   Dg Chest Port 1 View  Result Date: 02/12/2018 CLINICAL DATA:  Acute onset of shortness of breath. Expiratory wheezing. EXAM: PORTABLE CHEST 1 VIEW COMPARISON:  Chest radiograph performed 10/23/2014, and CTA of the  chest performed 10/24/2014 FINDINGS: A small right pleural effusion is noted, with associated opacity. Increased interstitial markings may reflect mild interstitial edema. Alternatively, right basilar pneumonia could have a similar appearance. No pneumothorax is seen. The cardiomediastinal silhouette is borderline normal in size. No acute osseous abnormalities are identified. IMPRESSION: Small right pleural effusion noted. Increased interstitial markings may reflect mild interstitial edema. Alternatively, right basilar pneumonia could have a similar appearance. Electronically Signed   By: Roanna Raider M.D.   On: 02/12/2018 02:36     CBC Recent Labs  Lab 02/12/18 0124 02/12/18 0654 02/13/18 0334  WBC 8.7 7.8  --   HGB 10.1* 9.2* 9.7*  HCT 30.6* 27.9*  --   PLT 388 332  --   MCV 80.1 79.9*  --   MCH 26.4 26.3  --   MCHC 33.0 32.9  --   RDW 17.6* 17.3*  --     Chemistries  Recent Labs  Lab 02/12/18 0654 02/13/18 0334 02/13/18 1613 02/14/18 0024 02/14/18 0802  NA 145 143 141 140 139  K 4.4 5.3* 5.3* 5.4* 5.2*  CL 115* 113* 111 109 107  CO2 26 21* 25 24 25   GLUCOSE 270* 227* 233* 229* 367*  BUN 32* 42* 45* 45* 46*  CREATININE 1.26* 1.41* 1.45* 1.37* 1.41*  CALCIUM 8.2* 8.2* 8.0* 8.1* 8.2*  MG  --  2.0 1.7 1.8 1.9   ------------------------------------------------------------------------------------------------------------------ estimated creatinine clearance is 95.8 mL/min (A) (by C-G formula based on SCr of 1.41 mg/dL (H)). ------------------------------------------------------------------------------------------------------------------ No results for  input(s): HGBA1C in the last 72 hours. ------------------------------------------------------------------------------------------------------------------ No results for input(s): CHOL, HDL, LDLCALC, TRIG, CHOLHDL, LDLDIRECT in the last 72 hours. ------------------------------------------------------------------------------------------------------------------ No results for input(s): TSH, T4TOTAL, T3FREE, THYROIDAB in the last 72 hours.  Invalid input(s): FREET3 ------------------------------------------------------------------------------------------------------------------ Recent Labs    02/12/18 1645  FERRITIN 36    Coagulation profile No results for input(s): INR, PROTIME in the last 168 hours.  No results for input(s): DDIMER in the last 72 hours.  Cardiac Enzymes Recent Labs  Lab 02/12/18 0124  TROPONINI <0.03   ------------------------------------------------------------------------------------------------------------------ Invalid input(s): POCBNP    Assessment & Plan  Patient is 47 year old noncompliant with his CHF regimen presenting with worsening shortness of breath and swelling   1.  Acute hypoxic respiratory failure Currently improved continue therapy with IV Lasix nebulizer therapy  2.Acute on chronic diastolic CHF (congestive heart failure) (HCC) - Continue IV Lasix drip Strongly recommended patient adhere to his diet and fluid intake  3.    Accelerated hypertension Continue lisinopril add IV hydralazine as needed  4  COPD with possible exacerbation continue nebulizer therapy. I will add steroids to his current regimen  5  Diabetes (HCC) -sliding scale insulin with corresponding glucose checks    6  Leg wounds and diabetic foot wound -blistering leg wounds due to his edema, treatment for edema as above, wound consult for treatment of the blistering wounds as well as his healing diabetic foot wound  7.  GERD continue Protonix      Code Status  Orders  (From admission, onward)         Start     Ordered   02/12/18 0339  Full code  Continuous     02/12/18 0338        Code Status History    This patient has a current code status but no historical code status.           Consults cardiology  DVT Prophylaxis  Lovenox  Lab Results  Component Value Date   PLT 332 02/12/2018     Time Spent in minutes   35 minutes spent Auburn Bilberry M.D on 02/14/2018 at 3:17 PM  Between 7am to 6pm - Pager - [phone]  After 6pm go to [email or web address] - Social research officer, government  [employer]   Office  [phone]

## 2018-02-14 NOTE — Progress Notes (Signed)
Report given to Susan on 2A  

## 2018-02-14 NOTE — Plan of Care (Signed)
Pt.'s VSS O/N, A&O x 4.  Pt. Tolerated being off Bipap while awake, maintaining SpO2 on 2 L [place]. Once pt. Fell asleep had some apnea and required Bipap support @ 30%.  Lasix gtt remains, UOP responsive > 2 L O/N. Care Management consult placed to assist pt.'s request to get O2 @ home and assistance with obtaining all medications reliably. Wound care completed, still some +3-+4 edema, but pt. Reports much improved breathing status.  Report given to June LeapSarah O.

## 2018-02-14 NOTE — Progress Notes (Addendum)
   Name: John RiggsMichael Laursen MRN: [id number] DOB: 05/18/1971     CONSULTATION DATE: 02/12/2018  Subjective & objective:   Patient arrived to the intensive care unit awake in no distress on BiPAP 14/6 40% and Lasix drip 10 mg/h.  Foley catheter was placed, 1350 of urine output was collected. All history was obtained from the hospitalist, the patient and EMR.  Subjective: No acute issues overnight.  Tolerated oral intake.  Required nocturnal BiPAP due to hypoxemia while sleeping.  Foley catheter in place and draining normally    VITAL SIGNS: Temp:  [98.5 F (36.9 C)-98.9 F (37.2 C)] 98.8 F (37.1 C) (08/11 0200) Pulse Rate:  [60-108] 104 (08/11 0500) Resp:  [0-48] 17 (08/11 0500) BP: (97-152)/(37-99) 137/68 (08/11 0500) SpO2:  [65 %-100 %] 97 % (08/11 0500) FiO2 (%):  [30 %-40 %] 30 % (08/11 0200) Weight:  [144 kg] 144 kg (08/10 1215)  Physical Examination:  Constitutional: No acute distress HEENT: PERRLA, mild JVD Cardiovascular: Apical pulse regular, S1-S2, no murmur regurg or gallop, +2 pulses bilaterally, +2 edema in bilateral lower extremities Pulmonary/Chest: Bilateral breath sounds, diffuse bibasilar crackles. Abdominal: Obese, normal bowel sounds, palpation reveals no organomegaly Musculoskeletal: Multiple missing toes, no other joint swelling Neurological: Alert and oriented x3, no focal deficits Skin: Warm and dry, bilateral lower extremities with venous stasis discoloration and erythema Psychiatric: This pleasant   ASSESSMENT / PLAN:  Acute respiratory failure with hypoxemia and hypercarbia, tolerating BiPAP 14/6 40%. -Monitor work of breathing O2 sat and ABG -Consider intubation if no improvement -Mandatory nocturnal BiPAP  CHF with pulmonary congestion, leg edema, scrotal edema and right pleural effusion -Optimize diuresis, currently Lasix drip and monitor renal panel -Follow-up with echocardiogram and a cardiology consult  Atelectasis with possible pneumonia as  infective etiology cannot be ruled out.  Right lower airspace disease -Zithromax + Rocephin.  Monitor CXR + CBC + FiO2  AKI with obstructive uropathy due to acute urine retention, mild hyperkalemia on ACE inhibitor + spironolactone + potassium supplement + Lasix drip -Optimize meds, volume status and monitor renal panel -Follow his renal ultrasound and consider renal consult if no improvement  COPD -Bronchodilators and inhaled steroids -Taper off systemic steroids as tolerated  Cellulitis of both lower extremities with diabetic ulcers.  On the Rocephin -Wound care  Acute urine retention:  Foley catheter draining normally.  Consider Flomax prior to discharge   PAD status post amputation left big toe: Wound care consult   Diabetes mellitus   Anemia -Keep hemoglobin more than 7 g/dL  Full code   DVT & GI prophylaxis.  Continue with supportive care  [facility] Brown Deerukov ANP-BC Pulmonary and Critical Care Medicine [facility] Pager [phone] or [phone]  NB: This document was prepared using Dragon voice recognition software and may include unintentional dictation errors.

## 2018-02-15 LAB — BASIC METABOLIC PANEL
ANION GAP: 7 (ref 5–15)
BUN: 51 mg/dL — ABNORMAL HIGH (ref 6–20)
CALCIUM: 8.4 mg/dL — AB (ref 8.9–10.3)
CO2: 24 mmol/L (ref 22–32)
CREATININE: 1.47 mg/dL — AB (ref 0.61–1.24)
Chloride: 105 mmol/L (ref 98–111)
GFR calc non Af Amer: 54 mL/min — ABNORMAL LOW (ref >60)
Glucose, Bld: 504 mg/dL (ref 70–99)
Potassium: 5.5 mmol/L — ABNORMAL HIGH (ref 3.5–5.1)
Sodium: 136 mmol/L (ref 135–145)

## 2018-02-15 LAB — HEPATIC FUNCTION PANEL
ALBUMIN: 2.2 g/dL — AB (ref 3.5–5.0)
ALK PHOS: 162 U/L — AB (ref 38–126)
ALT: 26 U/L (ref 0–44)
AST: 17 U/L (ref 15–41)
BILIRUBIN DIRECT: 0.1 mg/dL (ref 0.0–0.2)
BILIRUBIN TOTAL: 0.5 mg/dL (ref 0.3–1.2)
Indirect Bilirubin: 0.4 mg/dL (ref 0.3–0.9)
Total Protein: 6.5 g/dL (ref 6.5–8.1)

## 2018-02-15 LAB — GLUCOSE, CAPILLARY
GLUCOSE-CAPILLARY: 513 mg/dL — AB (ref 70–99)
GLUCOSE-CAPILLARY: 201 mg/dL — AB (ref 70–99)
Glucose-Capillary: 422 mg/dL — ABNORMAL HIGH (ref 70–99)
Glucose-Capillary: 177 mg/dL — ABNORMAL HIGH (ref 70–99)
Glucose-Capillary: 277 mg/dL — ABNORMAL HIGH (ref 70–99)

## 2018-02-15 LAB — POTASSIUM: Potassium: 5.3 mmol/L — ABNORMAL HIGH (ref 3.5–5.1)

## 2018-02-15 MED ORDER — PREDNISONE 50 MG PO TABS
50.0000 mg | ORAL_TABLET | Freq: Every day | ORAL | Status: DC
  Administered 2018-02-15 – 2018-02-18 (×4): 50 mg via ORAL
  Filled 2018-02-15 (×3): qty 1

## 2018-02-15 MED ORDER — INSULIN ASPART 100 UNIT/ML ~~LOC~~ SOLN
0.0000 [IU] | Freq: Three times a day (TID) | SUBCUTANEOUS | Status: DC
  Administered 2018-02-15: 20 [IU] via SUBCUTANEOUS
  Administered 2018-02-15: 7 [IU] via SUBCUTANEOUS
  Administered 2018-02-15: 4 [IU] via SUBCUTANEOUS
  Administered 2018-02-16: 20 [IU] via SUBCUTANEOUS
  Administered 2018-02-16: 15 [IU] via SUBCUTANEOUS
  Administered 2018-02-16: 4 [IU] via SUBCUTANEOUS
  Administered 2018-02-17: 20 [IU] via SUBCUTANEOUS
  Administered 2018-02-17: 11 [IU] via SUBCUTANEOUS
  Administered 2018-02-18: 20 [IU] via SUBCUTANEOUS
  Administered 2018-02-18: 3 [IU] via SUBCUTANEOUS
  Filled 2018-02-15 (×10): qty 1

## 2018-02-15 MED ORDER — SODIUM CHLORIDE 0.9 % IV SOLN
INTRAVENOUS | Status: DC | PRN
  Administered 2018-02-15: 500 mL via INTRAVENOUS

## 2018-02-15 MED ORDER — OXYCODONE HCL 5 MG PO TABS
10.0000 mg | ORAL_TABLET | Freq: Four times a day (QID) | ORAL | Status: DC | PRN
  Administered 2018-02-15 – 2018-02-18 (×8): 10 mg via ORAL
  Filled 2018-02-15 (×8): qty 2

## 2018-02-15 MED ORDER — COLLAGENASE 250 UNIT/GM EX OINT
TOPICAL_OINTMENT | Freq: Every day | CUTANEOUS | Status: DC
  Administered 2018-02-15 – 2018-02-26 (×9): via TOPICAL
  Filled 2018-02-15 (×3): qty 30

## 2018-02-15 MED ORDER — OXYCODONE HCL 5 MG PO TABS
5.0000 mg | ORAL_TABLET | Freq: Once | ORAL | Status: AC
  Administered 2018-02-15: 5 mg via ORAL

## 2018-02-15 MED ORDER — OXYCODONE HCL 5 MG PO TABS
10.0000 mg | ORAL_TABLET | Freq: Three times a day (TID) | ORAL | Status: DC | PRN
  Administered 2018-02-15: 10 mg via ORAL
  Filled 2018-02-15: qty 2

## 2018-02-15 MED ORDER — INSULIN ASPART 100 UNIT/ML ~~LOC~~ SOLN
14.0000 [IU] | Freq: Once | SUBCUTANEOUS | Status: AC
  Administered 2018-02-15: 14 [IU] via SUBCUTANEOUS
  Filled 2018-02-15: qty 1

## 2018-02-15 NOTE — Progress Notes (Signed)
Patient refused to wear BIPAP while sleeping. Patient has removed nasal cannula at times too. Will continue to educate.

## 2018-02-15 NOTE — Progress Notes (Signed)
[employer] - [place] at [facility]                                                                                                                                                                                  Patient Demographics   John RiggsMichael Lawson, is a 47 y.o. male, DOB - 10/04/1970, RUE:[id number]  Admit date - 02/12/2018   Admitting Physician Oralia Manisavid Willis, MD  Outpatient Primary MD for the patient is [facility]   LOS - 2  Subjective:  Patient states that he has not been peeing as much since yesterday.  Still has significant swelling   Review of Systems:   CONSTITUTIONAL: No documented fever. No fatigue, weakness. No weight gain, no weight loss.  EYES: No blurry or double vision.  ENT: No tinnitus. No postnasal drip. No redness of the oropharynx.  RESPIRATORY: No cough, no wheeze, no hemoptysis.  Positive dyspnea.  CARDIOVASCULAR: No chest pain. No orthopnea. No palpitations. No syncope.  Positive edema GASTROINTESTINAL: No nausea, no vomiting or diarrhea. No abdominal pain. No melena or hematochezia.  GENITOURINARY: No dysuria or hematuria.  ENDOCRINE: No polyuria or nocturia. No heat or cold intolerance.  HEMATOLOGY: No anemia. No bruising. No bleeding.  INTEGUMENTARY: No rashes. No lesions.  MUSCULOSKELETAL: No arthritis. No swelling. No gout.  NEUROLOGIC: No numbness, tingling, or ataxia. No seizure-type activity.  PSYCHIATRIC: No anxiety. No insomnia. No ADD.    Vitals:   Vitals:   02/15/18 0210 02/15/18 0349 02/15/18 0726 02/15/18 0800  BP:  (!) 159/90  118/63  Pulse:  (!) 115    Resp:    20  Temp:  98.4 F (36.9 C)  98.1 F (36.7 C)  TempSrc:  Oral  Oral  SpO2: 96% 96% 96% 97%  Weight:  (!) 142.5 kg    Height:        Wt Readings from Last 3 Encounters:  02/15/18 (!) 142.5 kg     Intake/Output Summary (Last 24 hours) at 02/15/2018 1500 Last data filed at 02/15/2018 1023 Gross per 24 hour  Intake 550.17 ml   Output 4875 ml  Net -4324.83 ml    Physical Exam:   GENERAL: Accessory muscle usage HEAD, EYES, EARS, NOSE AND THROAT: Atraumatic, normocephalic. Extraocular muscles are intact. Pupils equal and reactive to light. Sclerae anicteric. No conjunctival injection. No oro-pharyngeal erythema.  NECK: Supple. There is no jugular venous distention. No bruits, no lymphadenopathy, no thyromegaly.  HEART: Regular rate and rhythm,. No murmurs, no rubs, no clicks.  LUNGS: Crackles at both bases with accessory muscle usage ABDOMEN: Soft, flat, nontender, nondistended. Has good bowel sounds. No hepatosplenomegaly appreciated.  EXTREMITIES: Positive edema in  both lower extremity NEUROLOGIC: Drowsy SKIN: Moist and warm with no rashes appreciated.  Psych: Not anxious, depressed LN: No inguinal LN enlargement    Antibiotics   Anti-infectives (From admission, onward)   Start     Dose/Rate Route Frequency Ordered Stop   02/13/18 1200  cefTRIAXone (ROCEPHIN) 1 g in sodium chloride 0.9 % 100 mL IVPB     1 g 200 mL/hr over 30 Minutes Intravenous Daily 02/13/18 1125     02/13/18 1200  azithromycin (ZITHROMAX) tablet 500 mg     500 mg Oral Daily 02/13/18 1125        Medications   Scheduled Meds: . azithromycin  500 mg Oral Daily  . budesonide (PULMICORT) nebulizer solution  0.5 mg Nebulization BID  . chlorhexidine  15 mL Mouth Rinse BID  . collagenase   Topical Daily  . enoxaparin (LOVENOX) injection  40 mg Subcutaneous Q24H  . gabapentin  800 mg Oral TID  . insulin aspart  0-20 Units Subcutaneous TID WC  . insulin aspart  0-5 Units Subcutaneous QHS  . insulin glargine  20 Units Subcutaneous QHS  . ipratropium-albuterol  3 mL Nebulization Q6H  . lidocaine  2 patch Transdermal Q24H  . lisinopril  10 mg Oral Daily  . mouth rinse  15 mL Mouth Rinse q12n4p  . pantoprazole  40 mg Oral BID  . predniSONE  50 mg Oral Q breakfast   Continuous Infusions: . sodium chloride 500 mL (02/15/18 0047)  .  cefTRIAXone (ROCEPHIN)  IV Stopped (02/15/18 1045)  . furosemide (LASIX) infusion 8 mg/hr (02/15/18 1215)   PRN Meds:.sodium chloride, acetaminophen **OR** acetaminophen, albuterol, hydrALAZINE, ondansetron **OR** ondansetron (ZOFRAN) IV, oxyCODONE   Data Review:   Micro Results Recent Results (from the past 240 hour(s))  MRSA PCR Screening     Status: None   Collection Time: 02/13/18 12:23 PM  Result Value Ref Range Status   MRSA by PCR NEGATIVE NEGATIVE Final    Comment:        The GeneXpert MRSA Assay (FDA approved for NASAL specimens only), is one component of a comprehensive MRSA colonization surveillance program. It is not intended to diagnose MRSA infection nor to guide or monitor treatment for MRSA infections. Performed at [facility], [address] [place], [place] [address]     Radiology Reports [place] Renal  Result Date: 02/13/2018 CLINICAL DATA:  Acute kidney insufficiency EXAM: RENAL / URINARY TRACT ULTRASOUND COMPLETE COMPARISON:  None. FINDINGS: Right Kidney: Length: 12.9 cm. There is diffuse increased echotexture of the right kidney. No mass or hydronephrosis visualized. Left Kidney: Length: 14.5 cm. Echogenicity within normal limits. No mass or hydronephrosis visualized. Bladder: Evaluation of bladder is limited as patient has Foley catheter in place. IMPRESSION: Diffuse increased echotexture of the right kidney. This is nonspecific but can be seen in medical renal disease. No hydronephrosis identified bilaterally. Electronically Signed   By: Sherian ReinWei-Chen  Lin M.D.   On: 02/13/2018 16:36   Dg Chest Port 1 View  Result Date: 02/14/2018 CLINICAL DATA:  Atelectasis EXAM: PORTABLE CHEST 1 VIEW COMPARISON:  02/12/2018 FINDINGS: Increased interstitial markings with a perihilar distribution, increased, favoring mild interstitial edema. Small right pleural effusion. Associated right basilar opacity, likely atelectasis, unchanged. No pneumothorax. Cardiomegaly.  IMPRESSION: Cardiomegaly with mild interstitial edema, increased. Small right pleural effusion. Associated right lower lobe opacity, likely atelectasis, unchanged. Electronically Signed   By: Charline BillsSriyesh  Krishnan M.D.   On: 02/14/2018 07:16   Dg Chest Port 1 View  Result Date:  02/12/2018 CLINICAL DATA:  Acute onset of shortness of breath. Expiratory wheezing. EXAM: PORTABLE CHEST 1 VIEW COMPARISON:  Chest radiograph performed 10/23/2014, and CTA of the chest performed 10/24/2014 FINDINGS: A small right pleural effusion is noted, with associated opacity. Increased interstitial markings may reflect mild interstitial edema. Alternatively, right basilar pneumonia could have a similar appearance. No pneumothorax is seen. The cardiomediastinal silhouette is borderline normal in size. No acute osseous abnormalities are identified. IMPRESSION: Small right pleural effusion noted. Increased interstitial markings may reflect mild interstitial edema. Alternatively, right basilar pneumonia could have a similar appearance. Electronically Signed   By: Roanna Raider M.D.   On: 02/12/2018 02:36     CBC Recent Labs  Lab 02/12/18 0124 02/12/18 0654 02/13/18 0334  WBC 8.7 7.8  --   HGB 10.1* 9.2* 9.7*  HCT 30.6* 27.9*  --   PLT 388 332  --   MCV 80.1 79.9*  --   MCH 26.4 26.3  --   MCHC 33.0 32.9  --   RDW 17.6* 17.3*  --     Chemistries  Recent Labs  Lab 02/13/18 0334 02/13/18 1613 02/14/18 0024 02/14/18 0802 02/15/18 0336 02/15/18 0704  NA 143 141 140 139 136  --   K 5.3* 5.3* 5.4* 5.2* 5.5* 5.3*  CL 113* 111 109 107 105  --   CO2 21* 25 24 25 24   --   GLUCOSE 227* 233* 229* 367* 504*  --   BUN 42* 45* 45* 46* 51*  --   CREATININE 1.41* 1.45* 1.37* 1.41* 1.47*  --   CALCIUM 8.2* 8.0* 8.1* 8.2* 8.4*  --   MG 2.0 1.7 1.8 1.9  --   --    ------------------------------------------------------------------------------------------------------------------ estimated creatinine clearance is 91.4 mL/min  (A) (by C-G formula based on SCr of 1.47 mg/dL (H)). ------------------------------------------------------------------------------------------------------------------ No results for input(s): HGBA1C in the last 72 hours. ------------------------------------------------------------------------------------------------------------------ No results for input(s): CHOL, HDL, LDLCALC, TRIG, CHOLHDL, LDLDIRECT in the last 72 hours. ------------------------------------------------------------------------------------------------------------------ No results for input(s): TSH, T4TOTAL, T3FREE, THYROIDAB in the last 72 hours.  Invalid input(s): FREET3 ------------------------------------------------------------------------------------------------------------------ Recent Labs    02/12/18 1645  FERRITIN 36    Coagulation profile No results for input(s): INR, PROTIME in the last 168 hours.  No results for input(s): DDIMER in the last 72 hours.  Cardiac Enzymes Recent Labs  Lab 02/12/18 0124  TROPONINI <0.03   ------------------------------------------------------------------------------------------------------------------ Invalid input(s): POCBNP    Assessment & Plan  Patient is 47 year old noncompliant with his CHF regimen presenting with worsening shortness of breath and swelling   1.  Acute hypoxic respiratory failure severe fluid overload Continue continue therapy with IV Lasix nebulizer therapy  2.Acute on chronic diastolic CHF (congestive heart failure) (HCC) - Continue IV Lasix drip I will increase the dose again Strongly recommended patient adhere to his diet and fluid intake  3.    Accelerated hypertension Continue lisinopril add IV hydralazine as needed  4  COPD with possible exacerbation continue nebulizer therapy. I will add steroids to his current regimen  5  Diabetes (HCC) -sliding scale insulin with corresponding glucose checks  Continue Lantus and sliding scale  insulin  6  Leg wounds and diabetic foot wound -blistering leg wounds due to his edema, treatment for edema as above, wound consult for treatment of the blistering wounds as well as his healing diabetic foot wound  7.  GERD continue Protonix      Code Status Orders  (From admission, onward)  Start     Ordered   02/12/18 0339  Full code  Continuous     02/12/18 0338        Code Status History    This patient has a current code status but no historical code status.           Consults cardiology  DVT Prophylaxis  Lovenox  Lab Results  Component Value Date   PLT 332 02/12/2018     Time Spent in minutes   35 minutes spent Auburn Bilberry M.D on 02/15/2018 at 3:00 PM  Between 7am to 6pm - Pager - [phone]  After 6pm go to [email or web address] - Social research officer, government  [employer]   Office  [phone]

## 2018-02-15 NOTE — Consult Note (Signed)
WOC Nurse wound consult note Reason for Consult:Amputation site right second toe.  Ongoing wound care.  WOund bed is 75% adherent slough Wound type: surgical wound, nonhealing Pressure Injury POA: NA Measurement: 3.2 cm x 0.3 cm  Wound bed: 75% adherent slough 25% pale pink nongranulating Drainage (amount, consistency, odor) minimal serosanguinous no odor Periwound:intact Bilateral legs are edematous and scattered 0.2 cm blistered lesions present. Dressing procedure/placement/frequency:Cleanse surgical wound to right foot with NS. Apply Santyl to wound bed.  Cover with NS moist 2x2.  Cover with gauze and kerlix.  Change daily.  Will not follow at this time.  Please re-consult if needed.  Maple HudsonKaren Kindrick Lankford RN BSN CWON Pager [phone]

## 2018-02-15 NOTE — Progress Notes (Signed)
PT Cancellation Note  Patient Details Name: John Lawson MRN: [id number] DOB: 10/11/1970   Cancelled Treatment:    Reason Eval/Treat Not Completed: Other (comment).  Pt was having his meal but talked with him about shoes for skin protection.  Spoke with his nurse and shoes are ordered to use next time PT sees him.  Follow up tomorrow.   Ivar DrapeRuth E Denelda Akerley 02/15/2018, 5:45 PM   Samul Dadauth Deylan Canterbury, PT MS Acute Rehab Dept. Number: [facility] [phone] and [facility] [phone]

## 2018-02-15 NOTE — Plan of Care (Signed)
Continue heart failure education. Patient is diuresing. Patients needs counseling on diet.

## 2018-02-15 NOTE — Plan of Care (Signed)
  Problem: Education: Goal: Knowledge of General Education information will improve Description Including pain rating scale, medication(s)/side effects and non-pharmacologic comfort measures Outcome: Progressing   Problem: Health Behavior/Discharge Planning: Goal: Ability to manage health-related needs will improve Outcome: Progressing   Problem: Clinical Measurements: Goal: Ability to maintain clinical measurements within normal limits will improve Outcome: Progressing Goal: Diagnostic test results will improve Outcome: Progressing   Problem: Pain Managment: Goal: General experience of comfort will improve Outcome: Progressing   Problem: Activity: Goal: Capacity to carry out activities will improve Outcome: Progressing

## 2018-02-15 NOTE — Progress Notes (Signed)
Inpatient Diabetes Program Recommendations  AACE/ADA: New Consensus Statement on Inpatient Glycemic Control (2015)  Target Ranges:  Prepandial:   less than 140 mg/dL      Peak postprandial:   less than 180 mg/dL (1-2 hours)      Critically ill patients:  140 - 180 mg/dL   Results for John Lawson, John Lawson (MRN [id number]) as of 02/15/2018 09:14  Ref. Range 02/14/2018 07:41 02/14/2018 12:20 02/14/2018 16:39 02/14/2018 20:32  Glucose-Capillary Latest Ref Range: 70 - 99 mg/dL 782311 (H)  7 units NOVOLOG  385 (H)  9 units NOVOLOG  313 (H)  7 units NOVOLOG  245 (H)  2 units NOVOLOG +  20 units LANTUS   Results for John Lawson, John Lawson (MRN [id number]) as of 02/15/2018 09:14  Ref. Range 02/15/2018 05:45 02/15/2018 07:33  Glucose-Capillary Latest Ref Range: 70 - 99 mg/dL 578513 (HH)  14 units NOVOLOG  422 (H)  20 units NOVOLOG     Home DM Meds: Lantus 28 units QHS       Metformin 2000 mg daily  Current Orders: Lantus 20 units QHS      Novolog Resistant Correction Scale/ SSI (0-20 units) TID AC + HS      CBGs elevated due to steroids.    Note Solumedrol stopped--Last dose given yesterday at 6pm.  Now getting Prednisone 50 mg daily.  CBG quite elevated this AM (513 mg/dl)--Given 14 units Novolog X 1 dose for this CBG.     MD- Please consider the following in-hospital insulin adjustments:  1. Increase Lantus to 28 units QHS (100% home dose)  2. Start Novolog Meal Coverage: Novolog 6 units TID with meals   (Please add the following Hold Parameters: Hold if pt eats <50% of meal, Hold if pt NPO)      --Will follow patient during hospitalization--  Ambrose FinlandJeannine Johnston John Pianka RN, MSN, CDE Diabetes Coordinator Inpatient Glycemic Control Team Team Pager: [phone] (8a-5p)

## 2018-02-15 NOTE — Care Management Note (Signed)
Case Management Note  Patient Details  Name: Nilda RiggsMichael Escalera MRN: [id number] Date of Birth: 11/28/1970  Subjective/Objective:                 Patient presents from home and admitted with congestive heart failure.  [facility] in the past and firm in his declaration of wishing agency to provide service again.  CM discussed home health agencies that have special programs for heart failure. has had previous amputation of toes. Oxygen is acute. Patient is requesting a home nebulizer but says has not been on nebulizer solutions up until now. No issues accessing medical care or obtaining medications.  May benefit from heart failure clinic   Action/Plan:   Expected Discharge Date:                  Expected Discharge Plan:  Home w Home Health Services  In-House Referral:     Discharge planning Services  HF Clinic  Post Acute Care Choice:    Choice offered to:     DME Arranged:    DME Agency:     HH Arranged:    HH Agency:     Status of Service:  In process, will continue to follow  If discussed at Long Length of Stay Meetings, dates discussed:    Additional Comments:  Eber HongGreene, Kery Batzel R, RN 02/15/2018, 4:25 PM

## 2018-02-15 NOTE — Plan of Care (Signed)
Nutrition Education Note  RD consulted for nutrition education regarding CHF and T2DM.  Pt reports that he is extremely frustrated "with my sugar being in the 500's this morning." Pt is concerned that he will not make it to his 1:00 pm doctor's appointment to get more Lasix medication. Throughout education, pt brought the conversation back to his concern regarding his sugars. RD had to redirect conversation several times.  RD was able to obtain some information regarding pt's typical daily intake. Pt states that he is "hard on himself" with his diet and that his family members also make sure "I don't have anything I'm not supposed to."  Pt shares that the main reason he is in the hospital is because he didn't have his Lasix for 5 days PTA.  Pt endorses eating 3 meals daily. Pt states that the meals he eats at home are very similar to the meals he has been eating during his admission: Breakfast: eggs Lunch: apple or pear or salad Dinner: chicken or steak or low-sodium beans  Pt denies eating anything fried and states he drinks mostly water at home. Pt shares he has had 1 glass of tea over the past month.  Pt endorses good blood glucose control PTA and states he rarely had readings over 180. Pt states he takes 24 units Lantus daily at bedtime. If pt wants something sweet, he reports eating a piece of sugar-free candy.  Pt shares that he did have chicken pot pie and some [facility] Reino [facility]Pepper [facility] during [facility]. Pt reports that he rarely goes out to eat when he is at home and that a family member brought this meal to him "because I was in the hospital."  RD provided "Low Sodium Nutrition Therapy" handout from the Academy of Nutrition and Dietetics. Reviewed patient's dietary recall. Provided examples on ways to decrease sodium intake in diet. Discouraged intake of processed foods and use of salt shaker. Encouraged fresh fruits and vegetables as well as whole grain sources of carbohydrates to maximize  fiber intake.  Discussed different food groups and their effects on blood sugar, emphasizing carbohydrate-containing foods. Provided list of carbohydrates and recommended serving sizes of common foods.  Discussed importance of controlled and consistent carbohydrate intake throughout the day. Provided examples of ways to balance meals/snacks and encouraged intake of high-fiber, whole grain complex carbohydrates.   RD discussed why it is important for patient to adhere to diet recommendations, and emphasized the role of fluids, foods to avoid, and importance of weighing self daily. Teach back method used.  Expect fair compliance.  Body mass index is 40.34 kg/m. Pt meets criteria for "Obesity Class III" based on current BMI.  Current diet order is Heart Healthy/Carb Modified, patient is consuming approximately 100% of meals at this time. Labs and medications reviewed. No further nutrition interventions warranted at this time. RD contact information provided. If additional nutrition issues arise, please re-consult RD.    Earma ReadingKate Jablonski Ave Scharnhorst, MS, RD, LDN Pager: [phone] Weekend/After Hours: [phone]

## 2018-02-15 NOTE — Progress Notes (Signed)
Notified Dr. Anne HahnWillis of patient's continued pain despite receiving oxycodone 10mg , PRN q 8 hours.  Increased to q 6 hours PRN.

## 2018-02-16 LAB — BASIC METABOLIC PANEL
ANION GAP: 6 (ref 5–15)
BUN: 53 mg/dL — ABNORMAL HIGH (ref 6–20)
CALCIUM: 8.3 mg/dL — AB (ref 8.9–10.3)
CO2: 26 mmol/L (ref 22–32)
Chloride: 104 mmol/L (ref 98–111)
Creatinine, Ser: 1.59 mg/dL — ABNORMAL HIGH (ref 0.61–1.24)
GFR, EST AFRICAN AMERICAN: 57 mL/min — AB (ref >60)
GFR, EST NON AFRICAN AMERICAN: 49 mL/min — AB (ref >60)
Glucose, Bld: 479 mg/dL — ABNORMAL HIGH (ref 70–99)
Potassium: 5.6 mmol/L — ABNORMAL HIGH (ref 3.5–5.1)
SODIUM: 136 mmol/L (ref 135–145)

## 2018-02-16 LAB — GLUCOSE, CAPILLARY
Glucose-Capillary: 510 mg/dL (ref 70–99)
Glucose-Capillary: 360 mg/dL — ABNORMAL HIGH (ref 70–99)
Glucose-Capillary: 192 mg/dL — ABNORMAL HIGH (ref 70–99)
Glucose-Capillary: 301 mg/dL — ABNORMAL HIGH (ref 70–99)
Glucose-Capillary: 262 mg/dL — ABNORMAL HIGH (ref 70–99)

## 2018-02-16 LAB — ECHOCARDIOGRAM COMPLETE
Height: 74 in
WEIGHTICAEL: 3600 [oz_av]

## 2018-02-16 LAB — CALCIUM, IONIZED
CALCIUM, IONIZED, SERUM: 4.7 mg/dL (ref 4.5–5.6)
CALCIUM, IONIZED, SERUM: 4.7 mg/dL (ref 4.5–5.6)
Calcium, Ionized, Serum: 4.9 mg/dL (ref 4.5–5.6)

## 2018-02-16 MED ORDER — INSULIN ASPART 100 UNIT/ML ~~LOC~~ SOLN
12.0000 [IU] | Freq: Once | SUBCUTANEOUS | Status: AC
  Administered 2018-02-16: 12 [IU] via SUBCUTANEOUS
  Filled 2018-02-16: qty 1

## 2018-02-16 MED ORDER — INSULIN GLARGINE 100 UNIT/ML ~~LOC~~ SOLN
28.0000 [IU] | Freq: Every day | SUBCUTANEOUS | Status: DC
Start: 2018-02-16 — End: 2018-02-18
  Administered 2018-02-16 – 2018-02-17 (×2): 28 [IU] via SUBCUTANEOUS
  Filled 2018-02-16 (×3): qty 0.28

## 2018-02-16 MED ORDER — CEFDINIR 300 MG PO CAPS
300.0000 mg | ORAL_CAPSULE | Freq: Two times a day (BID) | ORAL | Status: DC
  Administered 2018-02-17 – 2018-02-22 (×11): 300 mg via ORAL
  Filled 2018-02-16 (×12): qty 1

## 2018-02-16 MED ORDER — INSULIN REGULAR HUMAN 100 UNIT/ML IJ SOLN
10.0000 [IU] | Freq: Once | INTRAMUSCULAR | Status: AC
  Administered 2018-02-16: 10 [IU] via INTRAVENOUS
  Filled 2018-02-16: qty 0.1

## 2018-02-16 MED ORDER — INSULIN ASPART 100 UNIT/ML ~~LOC~~ SOLN
6.0000 [IU] | Freq: Three times a day (TID) | SUBCUTANEOUS | Status: DC
  Administered 2018-02-16 – 2018-02-18 (×5): 6 [IU] via SUBCUTANEOUS
  Filled 2018-02-16 (×5): qty 1

## 2018-02-16 NOTE — Progress Notes (Signed)
Notified Dr. Sheryle Hailiamond of K level of 5.6. Also of blood sugar on lab draw at 479. Rechecked blood sugar with glucometer and it resulted at 510.

## 2018-02-16 NOTE — Progress Notes (Signed)
Patient refuses bed alarm.  He occasionally asked to assistance to the bathroom.  He then walks without assistance to the bed. Then calls for help to get his legs back into the bed.  We have encouraged him frequently to call when he is up.  He insists he is stable "once he gets his legs under him".

## 2018-02-16 NOTE — Consult Note (Signed)
Date: 02/16/2018                  Patient Name:  John Lawson  MRN: [id number]  DOB: 09-Jan-1971  Age / Sex: 47 y.o., male         PCP: [facility]                 Service Requesting Consult:  Internal medicine/ Auburn Bilberry, MD                 Reason for Consult:  Volume overload            History of Present Illness: Patient is a 47 y.o. male with medical problems of diabetes with neuropathy, hypertension, chronic diastolic congestive heart failure, who was admitted to [facility] on 02/12/2018 for evaluation of severe generalized edema.  Patient reports that about 2 weeks ago he ran out of Lasix which he was taking twice a day.  He was unable to get refills.  Over the last 2 weeks he has gained massive amount of fluid.  His weight has increased from 230 pounds to 310 pounds He was placed on IV Lasix infusion and has responded well.  Urine output last 24 hours is 7000 cc In addition, patient also has hyperkalemia   Medications: Outpatient medications: Medications Prior to Admission  Medication Sig Dispense Refill Last Dose  . furosemide (LASIX) 80 MG tablet Take 40 mg by mouth 2 (two) times daily.      Marland Kitchen gabapentin (NEURONTIN) 800 MG tablet Take 800 mg by mouth 3 (three) times daily.     . insulin detemir (LEVEMIR) 100 UNIT/ML injection Inject 28 Units into the skin daily.     Marland Kitchen ipratropium-albuterol (DUONEB) 0.5-2.5 (3) MG/3ML SOLN Take 3 mLs by nebulization every 4 (four) hours as needed.     . metFORMIN (GLUCOPHAGE-XR) 500 MG 24 hr tablet Take 1,000 mg by mouth 2 (two) times daily.      . Multiple Vitamin (MULTIVITAMIN WITH MINERALS) TABS tablet Take 1 tablet by mouth daily.     . naproxen (NAPROSYN) 500 MG tablet Take 500 mg by mouth 2 (two) times daily as needed for mild pain.     Marland Kitchen omeprazole (PRILOSEC) 20 MG capsule Take 40 mg by mouth daily.      Marland Kitchen oxyCODONE (ROXICODONE) 15 MG immediate release tablet Take 7.5 mg by mouth every 8 (eight) hours as needed  for pain.     Marland Kitchen spironolactone (ALDACTONE) 25 MG tablet Take 25 mg by mouth daily.     . traZODone (DESYREL) 50 MG tablet Take 50 mg by mouth at bedtime as needed for sleep.      Marland Kitchen albuterol (PROVENTIL HFA;VENTOLIN HFA) 108 (90 Base) MCG/ACT inhaler Inhale 2 puffs into the lungs every 6 (six) hours as needed for wheezing.   PRN at PRN    Current medications: Current Facility-Administered Medications  Medication Dose Route Frequency Provider Last Rate Last Dose  . 0.9 %  sodium chloride infusion   Intravenous PRN Auburn Bilberry, MD 10 mL/hr at 02/15/18 0047 500 mL at 02/15/18 0047  . acetaminophen (TYLENOL) tablet 650 mg  650 mg Oral Q6H PRN Oralia Manis, MD       Or  . acetaminophen (TYLENOL) suppository 650 mg  650 mg Rectal Q6H PRN Oralia Manis, MD      . albuterol (PROVENTIL) (2.5 MG/3ML) 0.083% nebulizer solution 2.5 mg  2.5 mg Nebulization Q6H PRN Olene Floss, RPH      .  azithromycin (ZITHROMAX) tablet 500 mg  500 mg Oral Daily Maxcine HamMartin, Savanna M, [place]   500 mg at 02/16/18 0818  . budesonide (PULMICORT) nebulizer solution 0.5 mg  0.5 mg Nebulization BID Alford HighlandWieting, Richard, MD   0.5 mg at 02/16/18 0716  . cefTRIAXone (ROCEPHIN) 1 g in sodium chloride 0.9 % 100 mL IVPB  1 g Intravenous Daily Mauri ReadingMartin, Savanna M, RPH 200 mL/hr at 02/16/18 0940 1 g at 02/16/18 0940  . chlorhexidine (PERIDEX) 0.12 % solution 15 mL  15 mL Mouth Rinse BID Tukov-Yual, Magdalene S, NP   15 mL at 02/16/18 0818  . collagenase (SANTYL) ointment   Topical Daily Auburn BilberryPatel, Shreyang, MD      . enoxaparin (LOVENOX) injection 40 mg  40 mg Subcutaneous Q24H Wieting, Richard, MD   40 mg at 02/15/18 2141  . furosemide (LASIX) 250 mg in dextrose 5 % 250 mL (1 mg/mL) infusion  8 mg/hr Intravenous Continuous Auburn BilberryPatel, Shreyang, MD 8 mL/hr at 02/16/18 0932 8 mg/hr at 02/16/18 0932  . gabapentin (NEURONTIN) capsule 800 mg  800 mg Oral TID Oralia ManisWillis, David, MD   800 mg at 02/16/18 0818  . hydrALAZINE (APRESOLINE) injection 10 mg  10 mg  Intravenous Q6H PRN Auburn BilberryPatel, Shreyang, MD      . insulin aspart (novoLOG) injection 0-20 Units  0-20 Units Subcutaneous TID WC Arnaldo Nataliamond, Armarion S, MD   20 Units at 02/16/18 0815  . insulin aspart (novoLOG) injection 0-5 Units  0-5 Units Subcutaneous QHS Oralia ManisWillis, David, MD   3 Units at 02/15/18 2140  . insulin glargine (LANTUS) injection 20 Units  20 Units Subcutaneous QHS Oralia ManisWillis, David, MD   20 Units at 02/15/18 2140  . ipratropium-albuterol (DUONEB) 0.5-2.5 (3) MG/3ML nebulizer solution 3 mL  3 mL Nebulization Q6H Alford HighlandWieting, Richard, MD   3 mL at 02/16/18 0716  . lidocaine (LIDODERM) 5 % 2 patch  2 patch Transdermal Q24H Oralia ManisWillis, David, MD   2 patch at 02/16/18 0408  . lisinopril (PRINIVIL,ZESTRIL) tablet 10 mg  10 mg Oral Daily Oralia ManisWillis, David, MD   10 mg at 02/16/18 0818  . MEDLINE mouth rinse  15 mL Mouth Rinse q12n4p Tukov-Yual, Magdalene S, NP   15 mL at 02/15/18 1735  . ondansetron (ZOFRAN) tablet 4 mg  4 mg Oral Q6H PRN Oralia ManisWillis, David, MD       Or  . ondansetron [facility](ZOFRAN) injection 4 mg  4 mg Intravenous Q6H PRN Oralia ManisWillis, David, MD      . oxyCODONE (Oxy IR/ROXICODONE) immediate release tablet 10 mg  10 mg Oral Q6H PRN Oralia ManisWillis, David, MD   10 mg at 02/16/18 0408  . pantoprazole (PROTONIX) EC tablet 40 mg  40 mg Oral BID Oralia ManisWillis, David, MD   40 mg at 02/16/18 0818  . predniSONE (DELTASONE) tablet 50 mg  50 mg Oral Q breakfast Auburn BilberryPatel, Shreyang, MD   50 mg at 02/16/18 0818      Allergies: Allergies  Allergen Reactions  . Tylenol [Acetaminophen] Nausea And Vomiting    Takes Percocet at home      Past Medical History: Past Medical History:  Diagnosis Date  . Chronic diastolic CHF (congestive heart failure) (HCC)   . Diabetes mellitus without complication (HCC)   . Hypertension      Past Surgical History: Past Surgical History:  Procedure Laterality Date  . SKIN DEBRIDEMENT    . SPHINCTEROTOMY    . TOE AMPUTATION       Family History: Family History  Problem Relation Age of  Onset  .  Diabetes Mother   . Cancer Mother   . Thyroid disease Mother   . Diabetes Father   . Diabetes Sister      Social History: Social History   Socioeconomic History  . Marital status: Married    Spouse name: Not on file  . Number of children: Not on file  . Years of education: Not on file  . Highest education level: Not on file  Occupational History  . Not on file  Social Needs  . Financial resource strain: Not on file  . Food insecurity:    Worry: Not on file    Inability: Not on file  . Transportation needs:    Medical: Not on file    Non-medical: Not on file  Tobacco Use  . Smoking status: Never Smoker  . Smokeless tobacco: Never Used  Substance and Sexual Activity  . Alcohol use: Not on file  . Drug use: Not on file  . Sexual activity: Not on file  Lifestyle  . Physical activity:    Days per week: Not on file    Minutes per session: Not on file  . Stress: Not on file  Relationships  . Social connections:    Talks on phone: Not on file    Gets together: Not on file    Attends religious service: Not on file    Active member of club or organization: Not on file    Attends meetings of clubs or organizations: Not on file    Relationship status: Not on file  . Intimate partner violence:    Fear of current or ex partner: Not on file    Emotionally abused: Not on file    Physically abused: Not on file    Forced sexual activity: Not on file  Other Topics Concern  . Not on file  Social History Narrative  . Not on file     Review of Systems: Gen: Denies fevers or chills HEENT: Denies vision or hearing problems CV: No chest pain, has severe volume overload Resp: Now requiring oxygen, thinks that he needs it at home and some GI: Appetite is good GU : Currently has a Foley catheter, good urine output MS: Chronic neck pain Derm:  No complaints Psych: No complaints Heme: No complaints Neuro: No complaints Endocrine.  Diabetes poorly controlled  Vital  Signs: Blood pressure (!) 158/77, pulse (!) 101, temperature 98.8 F (37.1 C), resp. rate 20, height 6\' 2"  (1.88 m), weight (!) 143.3 kg, SpO2 97 %.   Intake/Output Summary (Last 24 hours) at 02/16/2018 0952 Last data filed at 02/16/2018 0940 Gross per 24 hour  Intake 1591 ml  Output 8625 ml  Net -7034 ml    Weight trends: Filed Weights   02/13/18 1215 02/15/18 0349 02/16/18 0356  Weight: (!) 144 kg (!) 142.5 kg (!) 143.3 kg    Physical Exam: General:  No acute distress, laying in the bed  HEENT  anicteric, moist oral mucous membranes  Neck:  Supple  Lungs:  Mild bibasilar crackles  Heart::  No rub, regular rhythm  Abdomen:  Soft, nontender  Extremities:  3+ severe pitting edema, left big toe amputation, right toe amputation  Neurologic:  Alert, oriented, small muscle wasting in hands b/l  Skin:  No acute rashes     Foley:  In place       Lab results: Basic Metabolic Panel: Recent Labs  Lab 02/13/18 1613 02/14/18 0024 02/14/18 0802 02/15/18 5784 02/15/18 0704 02/16/18  0506  NA 141 140 139 136  --  136  K 5.3* 5.4* 5.2* 5.5* 5.3* 5.6*  CL 111 109 107 105  --  104  CO2 25 24 25 24   --  26  GLUCOSE 233* 229* 367* 504*  --  479*  BUN 45* 45* 46* 51*  --  53*  CREATININE 1.45* 1.37* 1.41* 1.47*  --  1.59*  CALCIUM 8.0* 8.1* 8.2* 8.4*  --  8.3*  MG 1.7 1.8 1.9  --   --   --   PHOS 4.5 4.0 4.3  --   --   --     Liver Function Tests: Recent Labs  Lab 02/15/18 0336  AST 17  ALT 26  ALKPHOS 162*  BILITOT 0.5  PROT 6.5  ALBUMIN 2.2*   No results for input(s): LIPASE, AMYLASE in the last 168 hours. No results for input(s): AMMONIA in the last 168 hours.  CBC: Recent Labs  Lab 02/12/18 0124 02/12/18 0654 02/13/18 0334  WBC 8.7 7.8  --   HGB 10.1* 9.2* 9.7*  HCT 30.6* 27.9*  --   MCV 80.1 79.9*  --   PLT 388 332  --     Cardiac Enzymes: Recent Labs  Lab 02/12/18 0124  TROPONINI <0.03    BNP: Invalid input(s): POCBNP  CBG: Recent Labs  Lab  02/15/18 1153 02/15/18 1639 02/15/18 2120 02/16/18 0625 02/16/18 0755  GLUCAP 177* 201* 277* 510* 360*    Microbiology: Recent Results (from the past 720 hour(s))  MRSA PCR Screening     Status: None   Collection Time: 02/13/18 12:23 PM  Result Value Ref Range Status   MRSA by PCR NEGATIVE NEGATIVE Final    Comment:        The GeneXpert MRSA Assay (FDA approved for NASAL specimens only), is one component of a comprehensive MRSA colonization surveillance program. It is not intended to diagnose MRSA infection nor to guide or monitor treatment for MRSA infections. Performed at [facility], [address] [place], [place] [address]      Coagulation Studies: No results for input(s): LABPROT, INR in the last 72 hours.  Urinalysis: No results for input(s): COLORURINE, LABSPEC, PHURINE, GLUCOSEU, HGBUR, BILIRUBINUR, KETONESUR, PROTEINUR, UROBILINOGEN, NITRITE, LEUKOCYTESUR in the last 72 hours.  Invalid input(s): APPERANCEUR      Imaging:  No results found.   Assessment & Plan: Pt is a 47 y.o. caucsian  male with HTN, dCHF, Diabetes with complications (neuropathy), history of positive ANA April 2019,  was admitted on 02/12/2018 with massive volume overload  1.  Acute kidney injury.  Baseline creatinine 1.13 from 03/03/1999 2.  Massive volume overload 3.  Hypoalbuminemia 4.  Diabetes with neuropathy, nephropathy, proteinuria 5.  Hyperkalemia  Plan: Agree with IV Lasix infusion, at present he has good response 2 D Echo results are pending Potassium expected to be corrected with diuresis Standing weights Will follow          LOS: 3 Casha Estupinan Thedore MinsSingh 8/13/20199:52 AM  [facility] [place], [place] [phone]  Note: This note was prepared with Dragon dictation. Any transcription errors are unintentional

## 2018-02-16 NOTE — Progress Notes (Signed)
Pt states he will not be ready to wear Bipap until around 0200. Family at bedside. Bipap at bedside. Pt will call if he wants to wear before this time.

## 2018-02-16 NOTE — Progress Notes (Signed)
Unable to visualize any portion of his penis due to groin swelling.

## 2018-02-16 NOTE — Progress Notes (Signed)
Inpatient Diabetes Program Recommendations  AACE/ADA: New Consensus Statement on Inpatient Glycemic Control (2015)  Target Ranges:  Prepandial:   less than 140 mg/dL      Peak postprandial:   less than 180 mg/dL (1-2 hours)      Critically ill patients:  140 - 180 mg/dL   Results for John Lawson, John Lawson (MRN [id number]) as of 02/16/2018 07:52  Ref. Range 02/15/2018 05:45 02/15/2018 07:33 02/15/2018 11:53 02/15/2018 16:39 02/15/2018 21:20  Glucose-Capillary Latest Ref Range: 70 - 99 mg/dL 782513 (HH)  14 units NOVOLOG  422 (H)  20 units NOVOLOG 177 (H)  4 units NOVOLOG 201 (H)  7 units NOVOLOG 277 (H)  3 units NOVOLOG +  20 units LANTUS    Results for John Lawson, John Lawson (MRN [id number]) as of 02/16/2018 07:52  Ref. Range 02/16/2018 06:25  Glucose-Capillary Latest Ref Range: 70 - 99 mg/dL 578510 (HH)  12 units NOVOLOG +  10 units REGULAR Insulin at 7:30am    Home DM Meds: Lantus 28 units QHS                             Metformin 2000 mg daily  Current Orders: Lantus 20 units QHS                            Novolog Resistant Correction Scale/ SSI (0-20 units) TID AC + HS      CBGs elevated due to steroids.    Note Solumedrol stopped--Last dose given 06/11 at 6pm.  Now getting Prednisone 50 mg daily.  CBG quite elevated this AM (510 mg/dl)--Given 12 units Novolog X 1 dose for this CBG + 10 units Regular Insulin at 7:30am.     MD- Please consider the following in-hospital insulin adjustments:  1. Increase Lantus to 28 units QHS (100% home dose)  2. Start Novolog Meal Coverage: Novolog 6 units TID with meals   (Please add the following Hold Parameters: Hold if pt eats <50% of meal, Hold if pt NPO)     --Will follow patient during hospitalization--  Ambrose FinlandJeannine Johnston Zareen Jamison RN, MSN, CDE Diabetes Coordinator Inpatient Glycemic Control Team Team Pager: [phone] (8a-5p)

## 2018-02-16 NOTE — Progress Notes (Signed)
PT Cancellation Note  Patient Details Name: Nilda RiggsMichael Burak MRN: [id number] DOB: 12/08/1970   Cancelled Treatment:      Order received and pt chart reviewed. Treatment deferred due to pt K+ levels being outside of PT intervention guidelines.Will re-attempt at later date/time as pt is available and medically appropriate.     Grayland Jackolby Braxten Memmer, SPT 02/16/18,10:07 AM

## 2018-02-16 NOTE — Progress Notes (Signed)
[employer] - [place] at [facility]                                                                                                                                                                                  Patient Demographics   John RiggsMichael Lawson, is a 47 y.o. male, DOB - 10/22/1970, UJW:[id number]  Admit date - 02/12/2018   Admitting Physician Oralia Manisavid Willis, MD  Outpatient Primary MD for the patient is [facility]   LOS - 3  Subjective:  Urine output has not picked up since increasing IV Lasix   Review of Systems:   CONSTITUTIONAL: No documented fever. No fatigue, weakness. No weight gain, no weight loss.  EYES: No blurry or double vision.  ENT: No tinnitus. No postnasal drip. No redness of the oropharynx.  RESPIRATORY: No cough, no wheeze, no hemoptysis.  Positive dyspnea.  CARDIOVASCULAR: No chest pain. No orthopnea. No palpitations. No syncope.  Positive edema GASTROINTESTINAL: No nausea, no vomiting or diarrhea. No abdominal pain. No melena or hematochezia.  GENITOURINARY: No dysuria or hematuria.  ENDOCRINE: No polyuria or nocturia. No heat or cold intolerance.  HEMATOLOGY: No anemia. No bruising. No bleeding.  INTEGUMENTARY: No rashes. No lesions.  MUSCULOSKELETAL: No arthritis. No swelling. No gout.  NEUROLOGIC: No numbness, tingling, or ataxia. No seizure-type activity.  PSYCHIATRIC: No anxiety. No insomnia. No ADD.    Vitals:   Vitals:   02/16/18 0356 02/16/18 0717 02/16/18 0753 02/16/18 0814  BP:   (!) 158/77   Pulse:   (!) 101   Resp:    20  Temp:   98.2 F (36.8 C) 98.8 F (37.1 C)  TempSrc:      SpO2:  99% 97%   Weight: (!) 143.3 kg     Height:        Wt Readings from Last 3 Encounters:  02/16/18 (!) 143.3 kg     Intake/Output Summary (Last 24 hours) at 02/16/2018 1403 Last data filed at 02/16/2018 1343 Gross per 24 hour  Intake 1691 ml  Output [id number] ml  Net -9384 ml    Physical Exam:   GENERAL:  Accessory muscle usage HEAD, EYES, EARS, NOSE AND THROAT: Atraumatic, normocephalic. Extraocular muscles are intact. Pupils equal and reactive to light. Sclerae anicteric. No conjunctival injection. No oro-pharyngeal erythema.  NECK: Supple. There is no jugular venous distention. No bruits, no lymphadenopathy, no thyromegaly.  HEART: Regular rate and rhythm,. No murmurs, no rubs, no clicks.  LUNGS: Crackles at both bases with accessory muscle usage ABDOMEN: Soft, flat, nontender, nondistended. Has good bowel sounds. No hepatosplenomegaly appreciated.  EXTREMITIES: Positive edema in both lower extremity NEUROLOGIC: Drowsy SKIN: Moist  and warm with no rashes appreciated.  Scrotal swelling significant Psych: Not anxious, depressed LN: No inguinal LN enlargement    Antibiotics   Anti-infectives (From admission, onward)   Start     Dose/Rate Route Frequency Ordered Stop   02/17/18 1000  cefdinir (OMNICEF) capsule 300 mg     300 mg Oral Every 12 hours 02/16/18 1259     02/13/18 1200  cefTRIAXone (ROCEPHIN) 1 g in sodium chloride 0.9 % 100 mL IVPB  Status:  Discontinued     1 g 200 mL/hr over 30 Minutes Intravenous Daily 02/13/18 1125 02/16/18 1259   02/13/18 1200  azithromycin (ZITHROMAX) tablet 500 mg  Status:  Discontinued     500 mg Oral Daily 02/13/18 1125 02/16/18 1259      Medications   Scheduled Meds: . budesonide (PULMICORT) nebulizer solution  0.5 mg Nebulization BID  . [START ON 02/17/2018] cefdinir  300 mg Oral Q12H  . chlorhexidine  15 mL Mouth Rinse BID  . collagenase   Topical Daily  . enoxaparin (LOVENOX) injection  40 mg Subcutaneous Q24H  . gabapentin  800 mg Oral TID  . insulin aspart  0-20 Units Subcutaneous TID WC  . insulin aspart  0-5 Units Subcutaneous QHS  . insulin aspart  6 Units Subcutaneous TID WC  . insulin glargine  28 Units Subcutaneous QHS  . ipratropium-albuterol  3 mL Nebulization Q6H  . lidocaine  2 patch Transdermal Q24H  . lisinopril  10 mg Oral  Daily  . mouth rinse  15 mL Mouth Rinse q12n4p  . pantoprazole  40 mg Oral BID  . predniSONE  50 mg Oral Q breakfast   Continuous Infusions: . sodium chloride 500 mL (02/15/18 0047)  . furosemide (LASIX) infusion 8 mg/hr (02/16/18 0932)   PRN Meds:.sodium chloride, acetaminophen **OR** acetaminophen, albuterol, hydrALAZINE, ondansetron **OR** ondansetron (ZOFRAN) IV, oxyCODONE   Data Review:   Micro Results Recent Results (from the past 240 hour(s))  MRSA PCR Screening     Status: None   Collection Time: 02/13/18 12:23 PM  Result Value Ref Range Status   MRSA by PCR NEGATIVE NEGATIVE Final    Comment:        The GeneXpert MRSA Assay (FDA approved for NASAL specimens only), is one component of a comprehensive MRSA colonization surveillance program. It is not intended to diagnose MRSA infection nor to guide or monitor treatment for MRSA infections. Performed at [facility], [address] [place], [place] [address]     Radiology Reports [place] [facility]  Result Date: 02/13/2018 CLINICAL DATA:  Acute kidney insufficiency EXAM: RENAL / URINARY TRACT ULTRASOUND COMPLETE COMPARISON:  None. FINDINGS: Right Kidney: Length: 12.9 cm. There is diffuse increased echotexture of the right kidney. No mass or hydronephrosis visualized. Left Kidney: Length: 14.5 cm. Echogenicity within normal limits. No mass or hydronephrosis visualized. Bladder: Evaluation of bladder is limited as patient has Foley catheter in place. IMPRESSION: Diffuse increased echotexture of the right kidney. This is nonspecific but can be seen in medical renal disease. No hydronephrosis identified bilaterally. Electronically Signed   By: Sherian ReinWei-Chen  Lin M.D.   On: 02/13/2018 16:36   Dg Chest Port 1 View  Result Date: 02/14/2018 CLINICAL DATA:  Atelectasis EXAM: PORTABLE CHEST 1 VIEW COMPARISON:  02/12/2018 FINDINGS: Increased interstitial markings with a perihilar distribution, increased, favoring mild interstitial  edema. Small right pleural effusion. Associated right basilar opacity, likely atelectasis, unchanged. No pneumothorax. Cardiomegaly. IMPRESSION: Cardiomegaly with mild interstitial edema, increased. Small right pleural effusion.  Associated right lower lobe opacity, likely atelectasis, unchanged. Electronically Signed   By: Charline Bills M.D.   On: 02/14/2018 07:16   Dg Chest Port 1 View  Result Date: 02/12/2018 CLINICAL DATA:  Acute onset of shortness of breath. Expiratory wheezing. EXAM: PORTABLE CHEST 1 VIEW COMPARISON:  Chest radiograph performed 10/23/2014, and CTA of the chest performed 10/24/2014 FINDINGS: A small right pleural effusion is noted, with associated opacity. Increased interstitial markings may reflect mild interstitial edema. Alternatively, right basilar pneumonia could have a similar appearance. No pneumothorax is seen. The cardiomediastinal silhouette is borderline normal in size. No acute osseous abnormalities are identified. IMPRESSION: Small right pleural effusion noted. Increased interstitial markings may reflect mild interstitial edema. Alternatively, right basilar pneumonia could have a similar appearance. Electronically Signed   By: Roanna Raider M.D.   On: 02/12/2018 02:36     CBC Recent Labs  Lab 02/12/18 0124 02/12/18 0654 02/13/18 0334  WBC 8.7 7.8  --   HGB 10.1* 9.2* 9.7*  HCT 30.6* 27.9*  --   PLT 388 332  --   MCV 80.1 79.9*  --   MCH 26.4 26.3  --   MCHC 33.0 32.9  --   RDW 17.6* 17.3*  --     Chemistries  Recent Labs  Lab 02/13/18 0334 02/13/18 1613 02/14/18 0024 02/14/18 0802 02/15/18 0336 02/15/18 0704 02/16/18 0506  NA 143 141 140 139 136  --  136  K 5.3* 5.3* 5.4* 5.2* 5.5* 5.3* 5.6*  CL 113* 111 109 107 105  --  104  CO2 21* 25 24 25 24   --  26  GLUCOSE 227* 233* 229* 367* 504*  --  479*  BUN 42* 45* 45* 46* 51*  --  53*  CREATININE 1.41* 1.45* 1.37* 1.41* 1.47*  --  1.59*  CALCIUM 8.2* 8.0* 8.1* 8.2* 8.4*  --  8.3*  MG 2.0 1.7  1.8 1.9  --   --   --   AST  --   --   --   --  17  --   --   ALT  --   --   --   --  26  --   --   ALKPHOS  --   --   --   --  162*  --   --   BILITOT  --   --   --   --  0.5  --   --    ------------------------------------------------------------------------------------------------------------------ estimated creatinine clearance is 84.7 mL/min (A) (by C-G formula based on SCr of 1.59 mg/dL (H)). ------------------------------------------------------------------------------------------------------------------ No results for input(s): HGBA1C in the last 72 hours. ------------------------------------------------------------------------------------------------------------------ No results for input(s): CHOL, HDL, LDLCALC, TRIG, CHOLHDL, LDLDIRECT in the last 72 hours. ------------------------------------------------------------------------------------------------------------------ No results for input(s): TSH, T4TOTAL, T3FREE, THYROIDAB in the last 72 hours.  Invalid input(s): FREET3 ------------------------------------------------------------------------------------------------------------------ No results for input(s): VITAMINB12, FOLATE, FERRITIN, TIBC, IRON, RETICCTPCT in the last 72 hours.  Coagulation profile No results for input(s): INR, PROTIME in the last 168 hours.  No results for input(s): DDIMER in the last 72 hours.  Cardiac Enzymes Recent Labs  Lab 02/12/18 0124  TROPONINI <0.03   ------------------------------------------------------------------------------------------------------------------ Invalid input(s): POCBNP    Assessment & Plan  Patient is 47 year old noncompliant with his CHF regimen presenting with worsening shortness of breath and swelling   1.  Acute hypoxic respiratory failure severe fluid overload Continue continue therapy with IV Lasix nebulizer therapy slowly improved  2.Acute on chronic diastolic CHF (congestive heart failure) (  HCC)  - Continue IV Lasix drip Strongly recommended patient adhere to his diet and fluid intake  3.    Accelerated hypertension Continue lisinopril add IV hydralazine as needed blood pressure stable  4  COPD with possible exacerbation continue nebulizer therapy. I will add steroids to his current regimen  5  Diabetes (HCC) -sliding scale insulin with corresponding glucose checks  Patient's Lantus will be adjusted for elevated blood sugars  6  Leg wounds and diabetic foot wound -blistering leg wounds due to his edema, treatment for edema as above, wound consult for treatment of the blistering wounds as well as his healing diabetic foot wound  7.  GERD continue Protonix      Code Status Orders  (From admission, onward)         Start     Ordered   02/12/18 0339  Full code  Continuous     02/12/18 0338        Code Status History    This patient has a current code status but no historical code status.           Consults cardiology  DVT Prophylaxis  Lovenox  Lab Results  Component Value Date   PLT 332 02/12/2018     Time Spent in minutes   35 minutes spent Auburn Bilberry M.D on 02/16/2018 at 2:03 PM  Between 7am to 6pm - Pager - [phone]  After 6pm go to [email or web address] - Social research officer, government  [employer]   Office  [phone]

## 2018-02-17 LAB — BASIC METABOLIC PANEL
ANION GAP: 9 (ref 5–15)
BUN: 55 mg/dL — ABNORMAL HIGH (ref 6–20)
CALCIUM: 8.4 mg/dL — AB (ref 8.9–10.3)
CO2: 28 mmol/L (ref 22–32)
Chloride: 102 mmol/L (ref 98–111)
Creatinine, Ser: 1.54 mg/dL — ABNORMAL HIGH (ref 0.61–1.24)
GFR calc Af Amer: 60 mL/min — ABNORMAL LOW (ref >60)
GFR, EST NON AFRICAN AMERICAN: 51 mL/min — AB (ref >60)
GLUCOSE: 410 mg/dL — AB (ref 70–99)
POTASSIUM: 4.8 mmol/L (ref 3.5–5.1)
Sodium: 139 mmol/L (ref 135–145)

## 2018-02-17 LAB — GLUCOSE, CAPILLARY
GLUCOSE-CAPILLARY: 288 mg/dL — AB (ref 70–99)
GLUCOSE-CAPILLARY: 344 mg/dL — AB (ref 70–99)
Glucose-Capillary: 393 mg/dL — ABNORMAL HIGH (ref 70–99)
Glucose-Capillary: 104 mg/dL — ABNORMAL HIGH (ref 70–99)

## 2018-02-17 LAB — HEMOGLOBIN A1C
HEMOGLOBIN A1C: 7.5 % — AB (ref 4.8–5.6)
MEAN PLASMA GLUCOSE: 168.55 mg/dL

## 2018-02-17 NOTE — Progress Notes (Signed)
[facility] [place], [place] 02/17/18  Subjective:   Doing fair Appetite is good Good amount of diuresis with lasix infusion S Creatinine stable  Objective:  Vital signs in last 24 hours:  Temp:  [97.7 F (36.5 C)-98.4 F (36.9 C)] 97.9 F (36.6 C) (08/14 0729) Pulse Rate:  [83-99] 83 (08/14 0729) Resp:  [16-17] 17 (08/14 0505) BP: (113-144)/(57-73) 113/57 (08/14 0729) SpO2:  [90 %-97 %] 96 % (08/14 1409) Weight:  [133.8 kg] 133.8 kg (08/14 0505)  Weight change: -9.48 kg Filed Weights   02/15/18 0349 02/16/18 0356 02/17/18 0505  Weight: (!) 142.5 kg (!) 143.3 kg 133.8 kg    Intake/Output:    Intake/Output Summary (Last 24 hours) at 02/17/2018 1501 Last data filed at 02/17/2018 1429 Gross per 24 hour  Intake 574.67 ml  Output 8500 ml  Net -7925.33 ml    Physical Exam: General:  No acute distress, laying in the bed  HEENT  anicteric, moist oral mucous membranes  Neck:  Supple  Lungs:  Mild bibasilar crackles  Heart::  No rub, regular rhythm  Abdomen:  Soft, nontender  Extremities:  3+ severe pitting edema, left big toe amputation, right toe amputation  Neurologic:  Alert, oriented, small muscle wasting in hands b/l  Skin:  Congestive hyperemia over leg  Foley:  In place      Basic Metabolic Panel:  Recent Labs  Lab 02/13/18 0334 02/13/18 1613 02/14/18 0024 02/14/18 0802 02/15/18 0336 02/15/18 0704 02/16/18 0506 02/17/18 0521  NA 143 141 140 139 136  --  136 139  K 5.3* 5.3* 5.4* 5.2* 5.5* 5.3* 5.6* 4.8  CL 113* 111 109 107 105  --  104 102  CO2 21* 25 24 25 24   --  26 28  GLUCOSE 227* 233* 229* 367* 504*  --  479* 410*  BUN 42* 45* 45* 46* 51*  --  53* 55*  CREATININE 1.41* 1.45* 1.37* 1.41* 1.47*  --  1.59* 1.54*  CALCIUM 8.2* 8.0* 8.1* 8.2* 8.4*  --  8.3* 8.4*  MG 2.0 1.7 1.8 1.9  --   --   --   --   PHOS  --  4.5 4.0 4.3  --   --   --   --      CBC: Recent Labs  Lab 02/12/18 0124 02/12/18 0654 02/13/18 0334  WBC  8.7 7.8  --   HGB 10.1* 9.2* 9.7*  HCT 30.6* 27.9*  --   MCV 80.1 79.9*  --   PLT 388 332  --      No results found for: HEPBSAG, HEPBSAB, HEPBIGM    Microbiology:  Recent Results (from the past 240 hour(s))  MRSA PCR Screening     Status: None   Collection Time: 02/13/18 12:23 PM  Result Value Ref Range Status   MRSA by PCR NEGATIVE NEGATIVE Final    Comment:        The GeneXpert MRSA Assay (FDA approved for NASAL specimens only), is one component of a comprehensive MRSA colonization surveillance program. It is not intended to diagnose MRSA infection nor to guide or monitor treatment for MRSA infections. Performed at [facility], [address] [place], [place] [address]     Coagulation Studies: No results for input(s): LABPROT, INR in the last 72 hours.  Urinalysis: No results for input(s): COLORURINE, LABSPEC, PHURINE, GLUCOSEU, HGBUR, BILIRUBINUR, KETONESUR, PROTEINUR, UROBILINOGEN, NITRITE, LEUKOCYTESUR in the last 72 hours.  Invalid input(s): APPERANCEUR    Imaging:  No results found.   Medications:   . sodium chloride 500 mL (02/15/18 0047)  . furosemide (LASIX) infusion 8 mg/hr (02/16/18 0932)   . budesonide (PULMICORT) nebulizer solution  0.5 mg Nebulization BID  . cefdinir  300 mg Oral Q12H  . chlorhexidine  15 mL Mouth Rinse BID  . collagenase   Topical Daily  . enoxaparin (LOVENOX) injection  40 mg Subcutaneous Q24H  . gabapentin  800 mg Oral TID  . insulin aspart  0-20 Units Subcutaneous TID WC  . insulin aspart  0-5 Units Subcutaneous QHS  . insulin aspart  6 Units Subcutaneous TID WC  . insulin glargine  28 Units Subcutaneous QHS  . ipratropium-albuterol  3 mL Nebulization Q6H  . lidocaine  2 patch Transdermal Q24H  . lisinopril  10 mg Oral Daily  . mouth rinse  15 mL Mouth Rinse q12n4p  . pantoprazole  40 mg Oral BID  . predniSONE  50 mg Oral Q breakfast   sodium chloride, acetaminophen **OR** acetaminophen, albuterol,  hydrALAZINE, ondansetron **OR** ondansetron (ZOFRAN) IV, oxyCODONE  Assessment/ Plan:  47 y.o. male  with HTN, dCHF, Diabetes with complications (neuropathy), history of positive ANA April 2019,  was admitted on 02/12/2018 with massive volume overload  1.  Acute kidney injury.  Baseline creatinine 1.13 from 03/03/1999 2.  Massive volume overload 3.  Hypoalbuminemia 4.  Diabetes with neuropathy, nephropathy, proteinuria 5.  Hyperkalemia  Plan: Agree with IV Lasix infusion, at present he has good response 2 D Echo = EF 50-55% Potassium is corrected but could go low with diuresis. Monitor closely with Mg Standing weights when possible Will follow   LOS: 4 [place] Shew 8/14/20193:01 PM  [facility] [place], [place] [phone]  Note: This note was prepared with Dragon dictation. Any transcription errors are unintentional

## 2018-02-17 NOTE — Care Management (Addendum)
Barrier- lasix drip for heart failure and edema.  SX are improving.  patient declined to participate with home 02 assessment when primary nurse attempted

## 2018-02-17 NOTE — Progress Notes (Signed)
Attempted 2 additional times to place pt on Bipap and he is refusing. RN Claris Gowerharlotte aware.

## 2018-02-17 NOTE — Progress Notes (Signed)
Both sets of wound care completed at this time, as ordered. The patient's BLE ACE wraps weren't even applied prior (for unknown reasons). Patient tolerated well. Will pass along in shift report. Will continue to monitor wound dressing status(es). John FavreSteven M [facility]

## 2018-02-17 NOTE — Progress Notes (Signed)
Bipap on standby at this time. 

## 2018-02-17 NOTE — Progress Notes (Addendum)
Patient requested BIPAP to be off, not keeping it on and the machine keeps on beeping. BIPAP turned off and notify respiratory. No issues noted. Will continue to monitor.

## 2018-02-17 NOTE — Progress Notes (Addendum)
Inpatient Diabetes Program Recommendations  AACE/ADA: New Consensus Statement on Inpatient Glycemic Control (2015)  Target Ranges:  Prepandial:   less than 140 mg/dL      Peak postprandial:   less than 180 mg/dL (1-2 hours)      Critically ill patients:  140 - 180 mg/dL   Results for John Lawson, John Lawson (MRN [id number]) as of 02/17/2018 07:46  Ref. Range 02/16/2018 06:25 02/16/2018 07:55 02/16/2018 11:27 02/16/2018 17:08 02/16/2018 20:57  Glucose-Capillary Latest Ref Range: 70 - 99 mg/dL 119510 (HH)  12 units NOVOLOG 360 (H)  20 units NOVOLOG +  10 units REGULAR 192 (H)  4 units NOVOLOG 301 (H)  21 units NOVOLOG 262 (H)  3 units NOVOLOG +  28 units LANTUS    Results for John Lawson, John Lawson (MRN [id number]) as of 02/17/2018 07:46  Ref. Range 02/17/2018 07:26  Glucose-Capillary Latest Ref Range: 70 - 99 mg/dL 130393 (H)     Home DM Meds:Lantus 28 units QHS Metformin 2000 mg daily  Current Orders:Lantus 28 units QHS Novolog Resistant Correction Scale/ SSI (0-20 units) TID AC + HS      Novolog 6 units TID with meals      CBGs elevated due to steroids.   Note Solumedrol stopped--Last dose given 06/11 at 6pm. Now getting Prednisone 50 mg daily.  CBG quite elevated this AM again despite increase of Lantus last PM.     MD- Please consider the following in-hospital insulin adjustments:  1. Increase Lantus to 35 units QHS (30% upward increase)  2. Increase Novolog Meal Coverage to: Novolog 10 units TID with meals  (Please add the following Hold Parameters: Hold if pt eats <50% of meal, Hold if pt NPO)     --Will follow patient during hospitalization--  Ambrose FinlandJeannine Johnston Chika Cichowski RN, MSN, CDE Diabetes Coordinator Inpatient Glycemic Control Team Team Pager: [phone] (8a-5p)

## 2018-02-17 NOTE — Progress Notes (Signed)
[employer] [facility] [place] [facility]                                                                                                                                                                                  Patient Demographics   John RiggsMichael Lawson, is a 47 y.o. male, DOB - 07/14/1970, WJX:[id number]  Admit date - 02/12/2018   Admitting Physician Oralia Manisavid Willis, MD  Outpatient Primary MD for the patient is [facility]   LOS - 4  Subjective:  Patient states that he is feeling less swelling in his abdomen legs scrotum still swollen   Review of Systems:   CONSTITUTIONAL: No documented fever. No fatigue, weakness. No weight gain, no weight loss.  EYES: No blurry or double vision.  ENT: No tinnitus. No postnasal drip. No redness of the oropharynx.  RESPIRATORY: No cough, no wheeze, no hemoptysis.  Positive dyspnea.  CARDIOVASCULAR: No chest pain. No orthopnea. No palpitations. No syncope.  Positive edema GASTROINTESTINAL: No nausea, no vomiting or diarrhea. No abdominal pain. No melena or hematochezia.  GENITOURINARY: No dysuria or hematuria.  ENDOCRINE: No polyuria or nocturia. No heat or cold intolerance.  HEMATOLOGY: No anemia. No bruising. No bleeding.  INTEGUMENTARY: No rashes. No lesions.  MUSCULOSKELETAL: No arthritis. No swelling. No gout.  NEUROLOGIC: No numbness, tingling, or ataxia. No seizure-type activity.  PSYCHIATRIC: No anxiety. No insomnia. No ADD.    Vitals:   Vitals:   02/16/18 2051 02/17/18 0505 02/17/18 0729 02/17/18 1409  BP: (!) 144/71 139/73 (!) 113/57   Pulse: 99 87 83   Resp: 16 17    Temp: 98.4 F (36.9 C) 97.7 F (36.5 C) 97.9 F (36.6 C)   TempSrc: Oral Oral Oral   SpO2: 94% 95% 97% 96%  Weight:  133.8 kg    Height:        Wt Readings from Last 3 Encounters:  02/17/18 133.8 kg     Intake/Output Summary (Last 24 hours) at 02/17/2018 1510 Last data filed at 02/17/2018 1429 Gross per 24 hour  Intake  574.67 ml  Output 8500 ml  Net -7925.33 ml    Physical Exam:   GENERAL: Accessory muscle usage HEAD, EYES, EARS, NOSE AND THROAT: Atraumatic, normocephalic. Extraocular muscles are intact. Pupils equal and reactive to light. Sclerae anicteric. No conjunctival injection. No oro-pharyngeal erythema.  NECK: Supple. There is no jugular venous distention. No bruits, no lymphadenopathy, no thyromegaly.  HEART: Regular rate and rhythm,. No murmurs, no rubs, no clicks.  LUNGS: Crackles at both bases with accessory muscle usage ABDOMEN: Soft, flat, nontender, nondistended. Has good bowel sounds. No hepatosplenomegaly appreciated.  EXTREMITIES: Positive edema in both  lower extremity NEUROLOGIC: Drowsy SKIN: Moist and warm with no rashes appreciated.  Scrotal swelling significant Psych: Not anxious, depressed LN: No inguinal LN enlargement    Antibiotics   Anti-infectives (From admission, onward)   Start     Dose/Rate Route Frequency Ordered Stop   02/17/18 1000  cefdinir (OMNICEF) capsule 300 mg     300 mg Oral Every 12 hours 02/16/18 1259     02/13/18 1200  cefTRIAXone (ROCEPHIN) 1 g in sodium chloride 0.9 % 100 mL IVPB  Status:  Discontinued     1 g 200 mL/hr over 30 Minutes Intravenous Daily 02/13/18 1125 02/16/18 1259   02/13/18 1200  azithromycin (ZITHROMAX) tablet 500 mg  Status:  Discontinued     500 mg Oral Daily 02/13/18 1125 02/16/18 1259      Medications   Scheduled Meds: . budesonide (PULMICORT) nebulizer solution  0.5 mg Nebulization BID  . cefdinir  300 mg Oral Q12H  . chlorhexidine  15 mL Mouth Rinse BID  . collagenase   Topical Daily  . enoxaparin (LOVENOX) injection  40 mg Subcutaneous Q24H  . gabapentin  800 mg Oral TID  . insulin aspart  0-20 Units Subcutaneous TID WC  . insulin aspart  0-5 Units Subcutaneous QHS  . insulin aspart  6 Units Subcutaneous TID WC  . insulin glargine  28 Units Subcutaneous QHS  . ipratropium-albuterol  3 mL Nebulization Q6H  .  lidocaine  2 patch Transdermal Q24H  . lisinopril  10 mg Oral Daily  . mouth rinse  15 mL Mouth Rinse q12n4p  . pantoprazole  40 mg Oral BID  . predniSONE  50 mg Oral Q breakfast   Continuous Infusions: . sodium chloride 500 mL (02/15/18 0047)  . furosemide (LASIX) infusion 8 mg/hr (02/16/18 0932)   PRN Meds:.sodium chloride, acetaminophen **OR** acetaminophen, albuterol, hydrALAZINE, ondansetron **OR** ondansetron (ZOFRAN) IV, oxyCODONE   Data Review:   Micro Results Recent Results (from the past 240 hour(s))  MRSA PCR Screening     Status: None   Collection Time: 02/13/18 12:23 PM  Result Value Ref Range Status   MRSA by PCR NEGATIVE NEGATIVE Final    Comment:        The GeneXpert MRSA Assay (FDA approved for NASAL specimens only), is one component of a comprehensive MRSA colonization surveillance program. It is not intended to diagnose MRSA infection nor to guide or monitor treatment for MRSA infections. Performed at [facility], [address] [place], [place] [address]     Radiology Reports US Renal  Result Date: 02/13/2018 CLINICAL DATA:  Acute kidney insufficiency EXAM: RENAL / URINARY TRACT ULTRASOUND COMPLETE COMPARISON:  None. FINDINGS: Right Kidney: Length: 12.9 cm. There is diffuse increased echotexture of the right kidney. No mass or hydronephrosis visualized. Left Kidney: Length: 14.5 cm. Echogenicity within normal limits. No mass or hydronephrosis visualized. Bladder: Evaluation of bladder is limited as patient has Foley catheter in place. IMPRESSION: Diffuse increased echotexture of the right kidney. This is nonspecific but can be seen in medical renal disease. No hydronephrosis identified bilaterally. Electronically Signed   By: Sherian Rein M.D.   On: 02/13/2018 16:36   Dg Chest Port 1 View  Result Date: 02/14/2018 CLINICAL DATA:  Atelectasis EXAM: PORTABLE CHEST 1 VIEW COMPARISON:  02/12/2018 FINDINGS: Increased interstitial markings with a  perihilar distribution, increased, favoring mild interstitial edema. Small right pleural effusion. Associated right basilar opacity, likely atelectasis, unchanged. No pneumothorax. Cardiomegaly. IMPRESSION: Cardiomegaly with mild interstitial edema, increased. Small  right pleural effusion. Associated right lower lobe opacity, likely atelectasis, unchanged. Electronically Signed   By: Charline BillsSriyesh  Krishnan M.D.   On: 02/14/2018 07:16   Dg Chest Port 1 View  Result Date: 02/12/2018 CLINICAL DATA:  Acute onset of shortness of breath. Expiratory wheezing. EXAM: PORTABLE CHEST 1 VIEW COMPARISON:  Chest radiograph performed 10/23/2014, and CTA of the chest performed 10/24/2014 FINDINGS: A small right pleural effusion is noted, with associated opacity. Increased interstitial markings may reflect mild interstitial edema. Alternatively, right basilar pneumonia could have a similar appearance. No pneumothorax is seen. The cardiomediastinal silhouette is borderline normal in size. No acute osseous abnormalities are identified. IMPRESSION: Small right pleural effusion noted. Increased interstitial markings may reflect mild interstitial edema. Alternatively, right basilar pneumonia could have a similar appearance. Electronically Signed   By: Roanna RaiderJeffery  Chang M.D.   On: 02/12/2018 02:36     CBC Recent Labs  Lab 02/12/18 0124 02/12/18 0654 02/13/18 0334  WBC 8.7 7.8  --   HGB 10.1* 9.2* 9.7*  HCT 30.6* 27.9*  --   PLT 388 332  --   MCV 80.1 79.9*  --   MCH 26.4 26.3  --   MCHC 33.0 32.9  --   RDW 17.6* 17.3*  --     Chemistries  Recent Labs  Lab 02/13/18 0334 02/13/18 1613 02/14/18 0024 02/14/18 0802 02/15/18 0336 02/15/18 0704 02/16/18 0506 02/17/18 0521  NA 143 141 140 139 136  --  136 139  K 5.3* 5.3* 5.4* 5.2* 5.5* 5.3* 5.6* 4.8  CL 113* 111 109 107 105  --  104 102  CO2 21* 25 24 25 24   --  26 28  GLUCOSE 227* 233* 229* 367* 504*  --  479* 410*  BUN 42* 45* 45* 46* 51*  --  53* 55*  CREATININE  1.41* 1.45* 1.37* 1.41* 1.47*  --  1.59* 1.54*  CALCIUM 8.2* 8.0* 8.1* 8.2* 8.4*  --  8.3* 8.4*  MG 2.0 1.7 1.8 1.9  --   --   --   --   AST  --   --   --   --  17  --   --   --   ALT  --   --   --   --  26  --   --   --   ALKPHOS  --   --   --   --  162*  --   --   --   BILITOT  --   --   --   --  0.5  --   --   --    ------------------------------------------------------------------------------------------------------------------ estimated creatinine clearance is 84.4 mL/min (A) (by C-G formula based on SCr of 1.54 mg/dL (H)). ------------------------------------------------------------------------------------------------------------------ Recent Labs    02/17/18 0521  HGBA1C 7.5*   ------------------------------------------------------------------------------------------------------------------ No results for input(s): CHOL, HDL, LDLCALC, TRIG, CHOLHDL, LDLDIRECT in the last 72 hours. ------------------------------------------------------------------------------------------------------------------ No results for input(s): TSH, T4TOTAL, T3FREE, THYROIDAB in the last 72 hours.  Invalid input(s): FREET3 ------------------------------------------------------------------------------------------------------------------ No results for input(s): VITAMINB12, FOLATE, FERRITIN, TIBC, IRON, RETICCTPCT in the last 72 hours.  Coagulation profile No results for input(s): INR, PROTIME in the last 168 hours.  No results for input(s): DDIMER in the last 72 hours.  Cardiac Enzymes Recent Labs  Lab 02/12/18 0124  TROPONINI <0.03   ------------------------------------------------------------------------------------------------------------------ Invalid input(s): POCBNP    Assessment & Plan  Patient is 47 year old noncompliant with his CHF regimen presenting with worsening shortness of breath and swelling  1.  Acute hypoxic respiratory failure severe fluid overload Continue continue  therapy with IV Lasix nebulizer therapy slowly improved  2.Acute on chronic diastolic CHF (congestive heart failure) (HCC) - Continue IV Lasix drip Strongly recommended patient adhere to his diet and fluid intake Echocardiogram shows no significant changes  3.    Accelerated hypertension pressure stable Continue lisinopril add IV hydralazine as needed blood pressure stable  4  COPD with possible exacerbation continue nebulizer therapy. I will add steroids to his current regimen  5  Diabetes (HCC) -sliding scale insulin with corresponding glucose checks  Patient's blood glucose labile  6  Leg wounds and diabetic foot wound -blistering leg wounds due to his edema, treatment for edema as above, wound consult for treatment of the blistering wounds as well as his healing diabetic foot wound  7.  GERD continue Protonix      Code Status Orders  (From admission, onward)         Start     Ordered   02/12/18 0339  Full code  Continuous     02/12/18 0338        Code Status History    This patient has a current code status but no historical code status.           Consults cardiology  DVT Prophylaxis  Lovenox  Lab Results  Component Value Date   PLT 332 02/12/2018     Time Spent in minutes   35 minutes spent Auburn Bilberry M.D on 02/17/2018 at 3:10 PM  Between 7am to 6pm - Pager - [phone]  After 6pm go to [email or web address] - Social research officer, government  [employer]   Office  [phone]

## 2018-02-17 NOTE — Plan of Care (Signed)
  Problem: Health Behavior/Discharge Planning: Goal: Ability to manage health-related needs will improve Outcome: Progressing Note:  Patient remains on Lasix drip at 8 cc / hour. Urinary output excellent. Will continue to monitor renal & fluid statuses. Jari FavreSteven M [facility]

## 2018-02-17 NOTE — Progress Notes (Signed)
Patient refusing to walk for home oxygen assessment for shift as requested by case management. Will most likely re-attempt in the AM. Will continue to monitor. Jari FavreSteven M [facility]

## 2018-02-17 NOTE — Evaluation (Signed)
Physical Therapy Evaluation Patient Details Name: John RiggsMichael Lawson MRN: [id number] DOB: 08/30/1970 Today's Date: 02/17/2018   History of Present Illness  John RiggsMichael Lawson is a 47 y.o. male brought to the ED from home via EMS with a chief complaint of shortness of breath.  Patient has a history of hypertension, diabetes, CHF who ran out of his Lasix 1 week ago.  Presents with a 50 pound weight gain over the past week, diffuse swelling and shortness of breath with wheezing.  Also complains of chest tightness. PMH of L toe amputation. R foot open ulcers.   Clinical Impression  At PT arrival pt was supine in bed receiving medicines from RN, agreeable to participating in therapy. Pt was able to perform bed activities such as SLR and heel slides with BLE's but had increased difficulty with RLE due to decreased ROM in R hip that he attributes to previous injury, stating he "fractured that hip a while back". Pt has limited hip flexion strength bilat, and decreased/absent sensation in BLE distal to knees. Pt coordination/proprioception of BLE is severely impaired. Pt needed excessively elevated bed position in order to get out of bed with min assist from therapist. Pt states his bed at home is very high as well but he has a bench he uses to get into and out of it. Pt has BLE pressure relief shoes, that he notes he has not worn much since his L toe amputation because they are uncomfortable to him. Pt required max assistance to put shoes on. Pt balance is fair (details below), and pt has difficulty with weight shifting during walking due to pain in BLE. Pt was only able to ambulate 20 ft without O2 [place] before desat to 84%. Pt was returned to O2 and returned to 96%. Pt states that he has been moving around house ind both w/ RW and and w/o AD (more details below). Pt states he has made his home very accessible (details below), but notes that these accessibility features are in his ex-wifes house. He says he will be returning to  his ex-wifes house upon d/c, but eventually will be going to his sisters home where accessibility is decreased. Pt verbalizes that he does not feel his mobility status is different from baseline. Pt mobility appears to be primarily limited by need for supplemental O2 , and pain in scrotum related to fluid retention. Given pt will be returning to intended residence, and pending medical improvement related to CHF and SOB pt is appropriate to d/c home with HHPT.      Follow Up Recommendations Home health PT    Equipment Recommendations  Rolling walker with 5" wheels    Recommendations for Other Services OT consult     Precautions / Restrictions Precautions Precautions: Fall Restrictions Weight Bearing Restrictions: No Other Position/Activity Restrictions: L toe amputation. R foot dressings on open ulcers.  BLE pressure relief shoes.       Mobility  Bed Mobility Overal bed mobility: Needs Assistance Bed Mobility: Supine to Sit     Supine to sit: Min assist;HOB elevated     General bed mobility comments: Pt requires help moving legs off of side of bed. Is able to flex trunk in neutral .   Transfers Overall transfer level: Needs assistance Equipment used: Rolling walker (2 wheeled) Transfers: Sit to/from Stand Sit to Stand: From elevated surface;Min assist         General transfer comment: Pt requires bed to be elevated to PT waist level in order to  stand up. Uses both hands to push off of bed.   Ambulation/Gait Ambulation/Gait assistance: Min assist Gait Distance (Feet): 20 Feet Assistive device: Rolling walker (2 wheeled) Gait Pattern/deviations: Step-to pattern;Decreased stance time - left;Decreased step length - right;Decreased weight shift to left;Trunk flexed;Wide base of support Gait velocity: decreased   General Gait Details: Pt has dififuclty shifting weight to L side but also occasionally reports pain in RLE and will shift weight to L . SpO2 decreased to 84% while  ambulating without O2 [place].  Stairs            Wheelchair Mobility    Modified Rankin (Stroke Patients Only)       Balance Overall balance assessment: Needs assistance Sitting-balance support: Bilateral upper extremity supported;Feet supported Sitting balance-Leahy Scale: Fair Sitting balance - Comments: Pt is unstable without hand held support of bed railings.  Postural control: Posterior lean Standing balance support: Bilateral upper extremity supported Standing balance-Leahy Scale: Fair Standing balance comment: Pt can remove BUE from walker but is highly unsteady when he does so.          [place] - Eyes Opened: 12 [place] - Eyes Closed: 2                 Pertinent Vitals/Pain Pain Assessment: 0-10 Pain Score: 6  Pain Location: Scrotum Pain Descriptors / Indicators: Constant;Crushing Pain Intervention(s): Limited activity within patient's tolerance;Repositioned;Monitored during session    Home Living Family/patient expects to be discharged to:: Private residence Living Arrangements: Spouse/significant other Available Help at Discharge: Family Type of Home: House Home Access: Stairs to enter Entrance Stairs-Rails: Can reach both Entrance Stairs-Number of Steps: 5 Home Layout: One level Home Equipment: Grab bars - tub/shower;Grab bars - toilet;Walker - 2 wheels Additional Comments: Pt reports he does not have shower seat or modified toilet anymore after his 3 in 1 broke about 3 months ago.     Prior Function Level of Independence: Independent with assistive device(s)   Gait / Transfers Assistance Needed: Pt reports he used walker around house some, but often funriture surfs or sues railings in house   ADL's / Homemaking Assistance Needed: Pt reprots doing all his own cooking and dressing, but notes his ex wife helped him quite a bit and now his sister will.         Hand Dominance   Dominant Hand: Right    Extremity/Trunk Assessment   Upper  Extremity Assessment Upper Extremity Assessment: Overall WFL for tasks assessed    Lower Extremity Assessment Lower Extremity Assessment: RLE deficits/detail;LLE deficits/detail RLE Deficits / Details: Pt reports hx of R hip fracture. Has limited hip flexion ROM, and decreased hip flexion strength. Pt has bandaged ulcerations on R foot. Decreased sensation bleow Knee and absent below ankle.  RLE Sensation: history of peripheral neuropathy;decreased light touch;decreased proprioception RLE Coordination: decreased gross motor LLE Deficits / Details: Pt has limtied ROM at L hip and knee. L big toe ampuation. Little to know sensation below knee, absent from ankle down. Unable to coordinate purposeful pumps of ankle without assistance.  LLE Sensation: decreased light touch;decreased proprioception;history of peripheral neuropathy LLE Coordination: decreased gross motor       Communication   Communication: No difficulties  Cognition Arousal/Alertness: Awake/alert Behavior During Therapy: WFL for tasks assessed/performed Overall Cognitive Status: Within Functional Limits for tasks assessed  General Comments      Exercises Other Exercises Other Exercises: Bed mobility: Supine to sit min assist; Transfers: Sit to stand RW min assist from elveated surface; Ambulation: 20 ft RW min assist Other Exercises: BLE SLR, heel slides x 10 ea. AROM BUE shoulder flex/abd, elbow flex/ext   Assessment/Plan    PT Assessment Patient needs continued PT services  PT Problem List Decreased strength;Decreased range of motion;Decreased mobility;Decreased activity tolerance;Decreased coordination;Impaired sensation;Decreased balance;Decreased knowledge of use of DME;Pain;Decreased safety awareness       PT Treatment Interventions DME instruction;Gait training;Stair training;Functional mobility training;Balance training;Therapeutic exercise;Therapeutic  activities;Patient/family education    PT Goals (Current goals can be found in the Care Plan section)  Acute Rehab PT Goals Patient Stated Goal: To go home PT Goal Formulation: With patient Time For Goal Achievement: 02/24/18 Potential to Achieve Goals: Good    Frequency Min 2X/week   Barriers to discharge Decreased caregiver support Pt states he wil be retunring to his home with his ex-wife, who he recently split with.     Co-evaluation               AM-PAC PT "6 Clicks" Daily Activity  Outcome Measure Difficulty turning over in bed (including adjusting bedclothes, sheets and blankets)?: A Lot Difficulty moving from lying on back to sitting on the side of the bed? : Unable Difficulty sitting down on and standing up from a chair with arms (e.g., wheelchair, bedside commode, etc,.)?: Unable Help needed moving to and from a bed to chair (including a wheelchair)?: A Little Help needed walking in hospital room?: A Little Help needed climbing 3-5 steps with a railing? : A Lot 6 Click Score: 12    End of Session Equipment Utilized During Treatment: Gait belt Activity Tolerance: Patient tolerated treatment well;Patient limited by pain Patient left: in bed;with bed alarm set;with call bell/phone within reach   PT Visit Diagnosis: Unsteadiness on feet (R26.81);Pain;Difficulty in walking, not elsewhere classified (R26.2);Other symptoms and signs involving the nervous system (R29.898) Pain - Right/Left: (scrotum and R foot)    Time: 1610-9604 PT Time Calculation (min) (ACUTE ONLY): 31 min   Charges:              Grayland Jack, SPT 02/17/18,1:10 PM

## 2018-02-17 NOTE — Progress Notes (Signed)
Respiratory stated patient refused the BIPAP and as such, she did not put  It on him. Patient might have put the BIPAP on himself.

## 2018-02-18 LAB — GLUCOSE, CAPILLARY
GLUCOSE-CAPILLARY: 127 mg/dL — AB (ref 70–99)
GLUCOSE-CAPILLARY: 206 mg/dL — AB (ref 70–99)
GLUCOSE-CAPILLARY: 291 mg/dL — AB (ref 70–99)
Glucose-Capillary: 365 mg/dL — ABNORMAL HIGH (ref 70–99)

## 2018-02-18 LAB — BASIC METABOLIC PANEL
ANION GAP: 8 (ref 5–15)
BUN: 55 mg/dL — ABNORMAL HIGH (ref 6–20)
CALCIUM: 8.4 mg/dL — AB (ref 8.9–10.3)
CO2: 31 mmol/L (ref 22–32)
Chloride: 99 mmol/L (ref 98–111)
Creatinine, Ser: 1.44 mg/dL — ABNORMAL HIGH (ref 0.61–1.24)
GFR calc non Af Amer: 56 mL/min — ABNORMAL LOW (ref >60)
GLUCOSE: 376 mg/dL — AB (ref 70–99)
POTASSIUM: 4.7 mmol/L (ref 3.5–5.1)
Sodium: 138 mmol/L (ref 135–145)

## 2018-02-18 LAB — MAGNESIUM: Magnesium: 2 mg/dL (ref 1.7–2.4)

## 2018-02-18 MED ORDER — OXYCODONE HCL 5 MG PO TABS
10.0000 mg | ORAL_TABLET | Freq: Four times a day (QID) | ORAL | Status: DC | PRN
  Administered 2018-02-18 – 2018-02-27 (×27): 10 mg via ORAL
  Filled 2018-02-18 (×27): qty 2

## 2018-02-18 MED ORDER — PREDNISONE 20 MG PO TABS
20.0000 mg | ORAL_TABLET | Freq: Every day | ORAL | Status: AC
  Administered 2018-02-21: 20 mg via ORAL
  Filled 2018-02-18: qty 1

## 2018-02-18 MED ORDER — INSULIN ASPART 100 UNIT/ML ~~LOC~~ SOLN
0.0000 [IU] | Freq: Every day | SUBCUTANEOUS | Status: DC
  Administered 2018-02-18: 3 [IU] via SUBCUTANEOUS
  Administered 2018-02-19: 2 [IU] via SUBCUTANEOUS
  Administered 2018-02-22 – 2018-02-24 (×2): 3 [IU] via SUBCUTANEOUS
  Filled 2018-02-18 (×4): qty 1

## 2018-02-18 MED ORDER — MELATONIN 5 MG PO TABS
5.0000 mg | ORAL_TABLET | Freq: Every day | ORAL | Status: DC
  Administered 2018-02-18 – 2018-02-25 (×4): 5 mg via ORAL
  Filled 2018-02-18 (×10): qty 1

## 2018-02-18 MED ORDER — PREDNISONE 10 MG PO TABS
10.0000 mg | ORAL_TABLET | Freq: Every day | ORAL | Status: AC
  Administered 2018-02-22: 10 mg via ORAL
  Filled 2018-02-18: qty 1

## 2018-02-18 MED ORDER — INSULIN ASPART 100 UNIT/ML ~~LOC~~ SOLN
10.0000 [IU] | Freq: Three times a day (TID) | SUBCUTANEOUS | Status: DC
  Administered 2018-02-18 – 2018-02-21 (×8): 10 [IU] via SUBCUTANEOUS
  Filled 2018-02-18 (×8): qty 1

## 2018-02-18 MED ORDER — ZOLPIDEM TARTRATE 5 MG PO TABS
10.0000 mg | ORAL_TABLET | Freq: Every day | ORAL | Status: DC
  Administered 2018-02-18 – 2018-02-20 (×2): 10 mg via ORAL
  Filled 2018-02-18 (×6): qty 2

## 2018-02-18 MED ORDER — INSULIN GLARGINE 100 UNIT/ML ~~LOC~~ SOLN
35.0000 [IU] | Freq: Every day | SUBCUTANEOUS | Status: DC
  Administered 2018-02-18 – 2018-02-19 (×2): 35 [IU] via SUBCUTANEOUS
  Filled 2018-02-18 (×3): qty 0.35

## 2018-02-18 MED ORDER — PREDNISONE 20 MG PO TABS
30.0000 mg | ORAL_TABLET | Freq: Every day | ORAL | Status: AC
  Administered 2018-02-20: 30 mg via ORAL
  Filled 2018-02-18: qty 1

## 2018-02-18 MED ORDER — INSULIN ASPART 100 UNIT/ML ~~LOC~~ SOLN
0.0000 [IU] | Freq: Three times a day (TID) | SUBCUTANEOUS | Status: DC
  Administered 2018-02-18: 7 [IU] via SUBCUTANEOUS
  Administered 2018-02-19 (×2): 11 [IU] via SUBCUTANEOUS
  Administered 2018-02-19: 7 [IU] via SUBCUTANEOUS
  Administered 2018-02-20: 15 [IU] via SUBCUTANEOUS
  Administered 2018-02-20: 11 [IU] via SUBCUTANEOUS
  Administered 2018-02-21: 3 [IU] via SUBCUTANEOUS
  Administered 2018-02-21 – 2018-02-22 (×3): 4 [IU] via SUBCUTANEOUS
  Administered 2018-02-23: 15 [IU] via SUBCUTANEOUS
  Administered 2018-02-24: 11 [IU] via SUBCUTANEOUS
  Administered 2018-02-25: 7 [IU] via SUBCUTANEOUS
  Administered 2018-02-25: 3 [IU] via SUBCUTANEOUS
  Administered 2018-02-25 – 2018-02-26 (×2): 4 [IU] via SUBCUTANEOUS
  Administered 2018-02-26 – 2018-02-27 (×2): 15 [IU] via SUBCUTANEOUS
  Filled 2018-02-18 (×18): qty 1

## 2018-02-18 MED ORDER — PREDNISONE 20 MG PO TABS
40.0000 mg | ORAL_TABLET | Freq: Every day | ORAL | Status: AC
  Administered 2018-02-19: 40 mg via ORAL
  Filled 2018-02-18: qty 2

## 2018-02-18 NOTE — Progress Notes (Signed)
Physical Therapy Treatment Patient Details Name: John Lawson Franze MRN: [id number] DOB: 08/09/1970 Today's Date: 02/18/2018    History of Present Illness John Lawson Weitz is a 47 y.o. male brought to the ED from home via EMS with a chief complaint of shortness of breath.  Patient has a history of hypertension, diabetes, CHF who ran out of his Lasix 1 week ago.  Presents with a 50 pound weight gain over the past week, diffuse swelling and shortness of breath with wheezing.  Also complains of chest tightness. PMH of L toe amputation. R foot open ulcers.     PT Comments    Pt is progressing towards goals today, but is still limited by scrotal pain, decreased O2 saturation ,and impulsivity.  Pt required supervision for bed mobility, transfers sit<>stand with CGA, and ambulates 10' with Min A. Pt returned to recliner after ambulating per pt prefrence, and O2 saturation was baove 90% on 2 L O2 flow rate. Current d/c recommendation remains appropriate at this time, and PT will continue to assess during hospital stay. SaO2 on room air at rest = 90% SaO2 on room air while ambulating = 85% SaO2 on 2 liters of O2 while ambulating = 87%   Follow Up Recommendations  Home health PT     Equipment Recommendations  Rolling walker with 5" wheels    Recommendations for Other Services       Precautions / Restrictions Precautions Precautions: Fall Restrictions Weight Bearing Restrictions: No Other Position/Activity Restrictions: L toe amputation. BLE     Mobility  Bed Mobility Overal bed mobility: Needs Assistance Bed Mobility: Supine to Sit     Supine to sit: Supervision;HOB elevated     General bed mobility comments: Pt required supervision for IV and catheter  managment  Transfers Overall transfer level: Needs assistance Equipment used: Rolling walker (2 wheeled) Transfers: Sit to/from Stand Sit to Stand: From elevated surface;Min guard         General transfer comment: Pt requires bed to  be elevated to PT waist level in order to stand up. Uses both hands to push off of bed. with CGA to prevent impulisvity  Ambulation/Gait Ambulation/Gait assistance: Min assist Gait Distance (Feet): 10 Feet Assistive device: Rolling walker (2 wheeled);IV Pole Gait Pattern/deviations: Step-through pattern     General Gait Details: Pt started ambulation with IV pole per prefrence then mid walk switched to RW per prefrence in a very unsafe mannor. Pt was very aggitated about line managment throughout ambulation. Pt required heavy verbal cueing to prevent unsafe impulsive behavior during ambulation.   Stairs             Wheelchair Mobility    Modified Rankin (Stroke Patients Only)       Balance Overall balance assessment: Needs assistance Sitting-balance support: Bilateral upper extremity supported;Feet supported Sitting balance-Leahy Scale: Fair     Standing balance support: Bilateral upper extremity supported Standing balance-Leahy Scale: Fair                              Cognition Arousal/Alertness: Awake/alert Behavior During Therapy: Impulsive Overall Cognitive Status: Within Functional Limits for tasks assessed                                 General Comments: pt was difficult to understand      Exercises      General Comments  Pertinent Vitals/Pain Pain Assessment: Faces Faces Pain Scale: Hurts little more Pain Location: Scrotum Pain Descriptors / Indicators: Constant;Crushing Pain Intervention(s): Limited activity within patient's tolerance;Monitored during session;Repositioned    Home Living                      Prior Function            PT Goals (current goals can now be found in the care plan section) Acute Rehab PT Goals Patient Stated Goal: To go home PT Goal Formulation: With patient Time For Goal Achievement: 02/24/18 Potential to Achieve Goals: Good Progress towards PT goals: Progressing toward  goals    Frequency    Min 2X/week      PT Plan Current plan remains appropriate    Co-evaluation              AM-PAC PT "6 Clicks" Daily Activity  Outcome Measure  Difficulty turning over in bed (including adjusting bedclothes, sheets and blankets)?: A Lot Difficulty moving from lying on back to sitting on the side of the bed? : A Little Difficulty sitting down on and standing up from a chair with arms (e.g., wheelchair, bedside commode, etc,.)?: Unable Help needed moving to and from a bed to chair (including a wheelchair)?: A Little Help needed walking in hospital room?: A Little Help needed climbing 3-5 steps with a railing? : A Lot 6 Click Score: 14    End of Session         PT Visit Diagnosis: Unsteadiness on feet (R26.81);Pain;Difficulty in walking, not elsewhere classified (R26.2);Other symptoms and signs involving the nervous system (R29.898) Pain - Right/Left: (scrotum)     Time: [id number] PT Time Calculation (min) (ACUTE ONLY): 20 min  Charges:                       Renaldo HarrisonStephen Aksel Bencomo, SPT    Renaldo HarrisonStephen Tauriel Scronce 02/18/2018, 4:51 PM

## 2018-02-18 NOTE — Progress Notes (Signed)
[employer] [facility] [place] [facility]                                                                                                                                                                                  Patient Demographics   John Lawson, is a 47 y.o. male, DOB - 03/23/1971, [id number]  Admit date - 02/12/2018   Admitting Physician Oralia Manisavid Willis, MD  Outpatient Primary MD for the patient is [facility]   LOS - 5  Subjective:  Is continuing to have good urine output.  He still has significant swelling in his scrotum   Review of Systems:   CONSTITUTIONAL: No documented fever. No fatigue, weakness. No weight gain, no weight loss.  EYES: No blurry or double vision.  ENT: No tinnitus. No postnasal drip. No redness of the oropharynx.  RESPIRATORY: No cough, no wheeze, no hemoptysis.  Positive dyspnea.  CARDIOVASCULAR: No chest pain. No orthopnea. No palpitations. No syncope.  Positive edema GASTROINTESTINAL: No nausea, no vomiting or diarrhea. No abdominal pain. No melena or hematochezia.  GENITOURINARY: No dysuria or hematuria.  ENDOCRINE: No polyuria or nocturia. No heat or cold intolerance.  HEMATOLOGY: No anemia. No bruising. No bleeding.  INTEGUMENTARY: No rashes. No lesions.  MUSCULOSKELETAL: No arthritis. No swelling. No gout.  NEUROLOGIC: No numbness, tingling, or ataxia. No seizure-type activity.  PSYCHIATRIC: No anxiety. No insomnia. No ADD.    Vitals:   Vitals:   02/17/18 2128 02/18/18 0319 02/18/18 0322 02/18/18 0818  BP:  140/73  (!) 147/75  Pulse:  86  85  Resp:  17  18  Temp:  98.2 F (36.8 C)  (!) 97.4 F (36.3 C)  TempSrc:  Axillary  Oral  SpO2: 90% 93% 94% 94%  Weight:  129.8 kg    Height:        Wt Readings from Last 3 Encounters:  02/18/18 129.8 kg     Intake/Output Summary (Last 24 hours) at 02/18/2018 1427 Last data filed at 02/18/2018 0658 Gross per 24 hour  Intake -  Output 8850 ml  Net  -8850 ml    Physical Exam:   GENERAL: Accessory muscle usage HEAD, EYES, EARS, NOSE AND THROAT: Atraumatic, normocephalic. Extraocular muscles are intact. Pupils equal and reactive to light. Sclerae anicteric. No conjunctival injection. No oro-pharyngeal erythema.  NECK: Supple. There is no jugular venous distention. No bruits, no lymphadenopathy, no thyromegaly.  HEART: Regular rate and rhythm,. No murmurs, no rubs, no clicks.  LUNGS: Crackles at both bases with accessory muscle usage ABDOMEN: Soft, flat, nontender, nondistended. Has good bowel sounds. No hepatosplenomegaly appreciated.  EXTREMITIES: Positive edema in both lower extremity NEUROLOGIC:  Drowsy SKIN: Moist and warm with no rashes appreciated.  Scrotal swelling significant Psych: Not anxious, depressed LN: No inguinal LN enlargement    Antibiotics   Anti-infectives (From admission, onward)   Start     Dose/Rate Route Frequency Ordered Stop   02/17/18 1000  cefdinir (OMNICEF) capsule 300 mg     300 mg Oral Every 12 hours 02/16/18 1259     02/13/18 1200  cefTRIAXone (ROCEPHIN) 1 g in sodium chloride 0.9 % 100 mL IVPB  Status:  Discontinued     1 g 200 mL/hr over 30 Minutes Intravenous Daily 02/13/18 1125 02/16/18 1259   02/13/18 1200  azithromycin (ZITHROMAX) tablet 500 mg  Status:  Discontinued     500 mg Oral Daily 02/13/18 1125 02/16/18 1259      Medications   Scheduled Meds: . budesonide (PULMICORT) nebulizer solution  0.5 mg Nebulization BID  . cefdinir  300 mg Oral Q12H  . chlorhexidine  15 mL Mouth Rinse BID  . collagenase   Topical Daily  . enoxaparin (LOVENOX) injection  40 mg Subcutaneous Q24H  . gabapentin  800 mg Oral TID  . insulin aspart  0-20 Units Subcutaneous TID WC  . insulin aspart  0-5 Units Subcutaneous QHS  . insulin aspart  6 Units Subcutaneous TID WC  . insulin glargine  28 Units Subcutaneous QHS  . ipratropium-albuterol  3 mL Nebulization Q6H  . lidocaine  2 patch Transdermal Q24H  .  lisinopril  10 mg Oral Daily  . mouth rinse  15 mL Mouth Rinse q12n4p  . pantoprazole  40 mg Oral BID  . predniSONE  50 mg Oral Q breakfast   Continuous Infusions: . sodium chloride 500 mL (02/15/18 0047)  . furosemide (LASIX) infusion 8 mg/hr (02/17/18 1658)   PRN Meds:.sodium chloride, acetaminophen **OR** acetaminophen, albuterol, hydrALAZINE, ondansetron **OR** ondansetron (ZOFRAN) IV, oxyCODONE   Data Review:   Micro Results Recent Results (from the past 240 hour(s))  MRSA PCR Screening     Status: None   Collection Time: 02/13/18 12:23 PM  Result Value Ref Range Status   MRSA by PCR NEGATIVE NEGATIVE Final    Comment:        The GeneXpert MRSA Assay (FDA approved for NASAL specimens only), is one component of a comprehensive MRSA colonization surveillance program. It is not intended to diagnose MRSA infection nor to guide or monitor treatment for MRSA infections. Performed at [facility], [address] [place], [place] [address]     Radiology Reports US Renal  Result Date: 02/13/2018 CLINICAL DATA:  Acute kidney insufficiency EXAM: RENAL / URINARY TRACT ULTRASOUND COMPLETE COMPARISON:  None. FINDINGS: Right Kidney: Length: 12.9 cm. There is diffuse increased echotexture of the right kidney. No mass or hydronephrosis visualized. Left Kidney: Length: 14.5 cm. Echogenicity within normal limits. No mass or hydronephrosis visualized. Bladder: Evaluation of bladder is limited as patient has Foley catheter in place. IMPRESSION: Diffuse increased echotexture of the right kidney. This is nonspecific but can be seen in medical renal disease. No hydronephrosis identified bilaterally. Electronically Signed   By: Sherian Rein M.D.   On: 02/13/2018 16:36   Dg Chest Port 1 View  Result Date: 02/14/2018 CLINICAL DATA:  Atelectasis EXAM: PORTABLE CHEST 1 VIEW COMPARISON:  02/12/2018 FINDINGS: Increased interstitial markings with a perihilar distribution, increased,  favoring mild interstitial edema. Small right pleural effusion. Associated right basilar opacity, likely atelectasis, unchanged. No pneumothorax. Cardiomegaly. IMPRESSION: Cardiomegaly with mild interstitial edema, increased. Small right pleural effusion.  Associated right lower lobe opacity, likely atelectasis, unchanged. Electronically Signed   By: Charline BillsSriyesh  Krishnan M.D.   On: 02/14/2018 07:16   Dg Chest Port 1 View  Result Date: 02/12/2018 CLINICAL DATA:  Acute onset of shortness of breath. Expiratory wheezing. EXAM: PORTABLE CHEST 1 VIEW COMPARISON:  Chest radiograph performed 10/23/2014, and CTA of the chest performed 10/24/2014 FINDINGS: A small right pleural effusion is noted, with associated opacity. Increased interstitial markings may reflect mild interstitial edema. Alternatively, right basilar pneumonia could have a similar appearance. No pneumothorax is seen. The cardiomediastinal silhouette is borderline normal in size. No acute osseous abnormalities are identified. IMPRESSION: Small right pleural effusion noted. Increased interstitial markings may reflect mild interstitial edema. Alternatively, right basilar pneumonia could have a similar appearance. Electronically Signed   By: Roanna RaiderJeffery  Chang M.D.   On: 02/12/2018 02:36     CBC Recent Labs  Lab 02/12/18 0124 02/12/18 0654 02/13/18 0334  WBC 8.7 7.8  --   HGB 10.1* 9.2* 9.7*  HCT 30.6* 27.9*  --   PLT 388 332  --   MCV 80.1 79.9*  --   MCH 26.4 26.3  --   MCHC 33.0 32.9  --   RDW 17.6* 17.3*  --     Chemistries  Recent Labs  Lab 02/13/18 0334 02/13/18 1613 02/14/18 0024 02/14/18 0802 02/15/18 0336 02/15/18 0704 02/16/18 0506 02/17/18 0521 02/18/18 0450  NA 143 141 140 139 136  --  136 139 138  K 5.3* 5.3* 5.4* 5.2* 5.5* 5.3* 5.6* 4.8 4.7  CL 113* 111 109 107 105  --  104 102 99  CO2 21* 25 24 25 24   --  26 28 31   GLUCOSE 227* 233* 229* 367* 504*  --  479* 410* 376*  BUN 42* 45* 45* 46* 51*  --  53* 55* 55*   CREATININE 1.41* 1.45* 1.37* 1.41* 1.47*  --  1.59* 1.54* 1.44*  CALCIUM 8.2* 8.0* 8.1* 8.2* 8.4*  --  8.3* 8.4* 8.4*  MG 2.0 1.7 1.8 1.9  --   --   --   --  2.0  AST  --   --   --   --  17  --   --   --   --   ALT  --   --   --   --  26  --   --   --   --   ALKPHOS  --   --   --   --  162*  --   --   --   --   BILITOT  --   --   --   --  0.5  --   --   --   --    ------------------------------------------------------------------------------------------------------------------ estimated creatinine clearance is 88.8 mL/min (A) (by C-G formula based on SCr of 1.44 mg/dL (H)). ------------------------------------------------------------------------------------------------------------------ Recent Labs    02/17/18 0521  HGBA1C 7.5*   ------------------------------------------------------------------------------------------------------------------ No results for input(s): CHOL, HDL, LDLCALC, TRIG, CHOLHDL, LDLDIRECT in the last 72 hours. ------------------------------------------------------------------------------------------------------------------ No results for input(s): TSH, T4TOTAL, T3FREE, THYROIDAB in the last 72 hours.  Invalid input(s): FREET3 ------------------------------------------------------------------------------------------------------------------ No results for input(s): VITAMINB12, FOLATE, FERRITIN, TIBC, IRON, RETICCTPCT in the last 72 hours.  Coagulation profile No results for input(s): INR, PROTIME in the last 168 hours.  No results for input(s): DDIMER in the last 72 hours.  Cardiac Enzymes Recent Labs  Lab 02/12/18 0124  TROPONINI <0.03   ------------------------------------------------------------------------------------------------------------------ Invalid input(s): POCBNP  Assessment & Plan  Patient is 47 year old noncompliant with his CHF regimen presenting with worsening shortness of breath and swelling   1.  Acute hypoxic respiratory  failure severe fluid overload Continue continue therapy with IV Lasix drip  2.Acute on chronic diastolic CHF (congestive heart failure) (HCC) - Continue IV Lasix drip and has put out more than 40 L of fluid Strongly recommended patient adhere to his diet and fluid intake Echocardiogram shows no significant changes  3.    Accelerated hypertension pressure stable Continue lisinopril add IV hydralazine as needed blood pressure stable  4  COPD with possible exacerbation continue nebulizer therapy. I will add steroids to his current regimen  5  Diabetes (HCC) -sliding scale insulin with corresponding glucose checks  Patient's blood glucose labile  6  Leg wounds and diabetic foot wound -blistering leg wounds due to his edema, treatment for edema as above, wound consult for treatment of the blistering wounds as well as his healing diabetic foot wound  7.  GERD continue Protonix      Code Status Orders  (From admission, onward)         Start     Ordered   02/12/18 0339  Full code  Continuous     02/12/18 0338        Code Status History    This patient has a current code status but no historical code status.           Consults cardiology  DVT Prophylaxis  Lovenox  Lab Results  Component Value Date   PLT 332 02/12/2018     Time Spent in minutes   35 minutes spent Auburn Bilberry M.D on 02/18/2018 at 2:27 PM  Between 7am to 6pm - Pager - [phone]  After 6pm go to [email or web address] - Social research officer, government  [employer]   Office  [phone]

## 2018-02-18 NOTE — Progress Notes (Signed)
Inpatient Diabetes Program Recommendations  AACE/ADA: New Consensus Statement on Inpatient Glycemic Control (2015)  Target Ranges:  Prepandial:   less than 140 mg/dL      Peak postprandial:   less than 180 mg/dL (1-2 hours)      Critically ill patients:  140 - 180 mg/dL   Results for John Lawson, John Lawson (MRN [id number]) as of 02/18/2018 08:41  Ref. Range 02/17/2018 07:26 02/17/2018 11:45 02/17/2018 16:42 02/17/2018 20:57  Glucose-Capillary Latest Ref Range: 70 - 99 mg/dL 409393 (H)  26 units NOVOLOG  104 (H)  0 units NOVOLOG  288 (H)  17 units NOVOLOG  344 (H)  4 units NOVOLOG +  28 units LANTUS    Results for John Lawson, John Lawson (MRN [id number]) as of 02/18/2018 08:41  Ref. Range 02/18/2018 08:15  Glucose-Capillary Latest Ref Range: 70 - 99 mg/dL 956365 (H)     Home DM Meds:Lantus 28 units QHS Metformin 2000 mg daily  Current Orders:Lantus 28 units QHS Novolog Resistant Correction Scale/ SSI (0-20 units) TID AC + HS                            Novolog 6 units TID with meals      CBGs elevated due to steroids.   Note Solumedrol stopped--Last dose given06/11at 6pm. Now getting Prednisone 50 mg daily.  CBG quite elevated this AM again despite increase of Lantus last PM.     MD- Please consider the following in-hospital insulin adjustments:  1. Increase Lantus to 35 units QHS (30% upward increase)  2. Increase Novolog Meal Coverage to: Novolog 10 units TID with meals  (Please add the following Hold Parameters: Hold if pt eats <50% of meal, Hold if pt NPO)      --Will follow patient during hospitalization--  Ambrose FinlandJeannine Johnston Aerilyn Slee RN, MSN, CDE Diabetes Coordinator Inpatient Glycemic Control Team Team Pager: [phone] (8a-5p)

## 2018-02-18 NOTE — Progress Notes (Signed)
[facility] [place], [place] 02/18/18  Subjective:   Doing fair Appetite is good Good amount of diuresis with lasix infusion. Another 11 Liters Labs ordered for this morning  Objective:  Vital signs in last 24 hours:  Temp:  [97.4 F (36.3 C)-98.4 F (36.9 C)] 97.4 F (36.3 C) (08/15 0818) Pulse Rate:  [85-95] 85 (08/15 0818) Resp:  [17-18] 18 (08/15 0818) BP: (124-153)/(61-81) 147/75 (08/15 0818) SpO2:  [88 %-96 %] 94 % (08/15 0818) Weight:  [129.8 kg] 129.8 kg (08/15 0319)  Weight change: -3.992 kg Filed Weights   02/16/18 0356 02/17/18 0505 02/18/18 0319  Weight: (!) 143.3 kg 133.8 kg 129.8 kg    Intake/Output:    Intake/Output Summary (Last 24 hours) at 02/18/2018 0856 Last data filed at 02/18/2018 [id number] Gross per 24 hour  Intake 240 ml  Output [id number] ml  Net -11060 ml    Physical Exam: General:  No acute distress, laying in the bed  HEENT  anicteric, moist oral mucous membranes  Neck:  Supple  Lungs:  Mild bibasilar crackles  Heart::  No rub, regular rhythm  Abdomen:  Soft, nontender  Extremities:  3+ severe pitting edema, left big toe amputation, right toe amputation  Neurologic:  Alert, oriented, small muscle wasting in hands b/l  Skin:  Congestive hyperemia over leg  Foley:  In place      Basic Metabolic Panel:  Recent Labs  Lab 02/13/18 0334 02/13/18 1613 02/14/18 0024 02/14/18 0802 02/15/18 0336 02/15/18 0704 02/16/18 0506 02/17/18 0521 02/18/18 0450  NA 143 141 140 139 136  --  136 139 138  K 5.3* 5.3* 5.4* 5.2* 5.5* 5.3* 5.6* 4.8 4.7  CL 113* 111 109 107 105  --  104 102 99  CO2 21* 25 24 25 24   --  26 28 31   GLUCOSE 227* 233* 229* 367* 504*  --  479* 410* 376*  BUN 42* 45* 45* 46* 51*  --  53* 55* 55*  CREATININE 1.41* 1.45* 1.37* 1.41* 1.47*  --  1.59* 1.54* 1.44*  CALCIUM 8.2* 8.0* 8.1* 8.2* 8.4*  --  8.3* 8.4* 8.4*  MG 2.0 1.7 1.8 1.9  --   --   --   --  2.0  PHOS  --  4.5 4.0 4.3  --   --   --   --   --       CBC: Recent Labs  Lab 02/12/18 0124 02/12/18 0654 02/13/18 0334  WBC 8.7 7.8  --   HGB 10.1* 9.2* 9.7*  HCT 30.6* 27.9*  --   MCV 80.1 79.9*  --   PLT 388 332  --      No results found for: HEPBSAG, HEPBSAB, HEPBIGM    Microbiology:  Recent Results (from the past 240 hour(s))  MRSA PCR Screening     Status: None   Collection Time: 02/13/18 12:23 PM  Result Value Ref Range Status   MRSA by PCR NEGATIVE NEGATIVE Final    Comment:        The GeneXpert MRSA Assay (FDA approved for NASAL specimens only), is one component of a comprehensive MRSA colonization surveillance program. It is not intended to diagnose MRSA infection nor to guide or monitor treatment for MRSA infections. Performed at [facility], [address] [place], [place] [address]     Coagulation Studies: No results for input(s): LABPROT, INR in the last 72 hours.  Urinalysis: No results for input(s): COLORURINE, LABSPEC, PHURINE, GLUCOSEU, HGBUR, BILIRUBINUR,  KETONESUR, PROTEINUR, UROBILINOGEN, NITRITE, LEUKOCYTESUR in the last 72 hours.  Invalid input(s): APPERANCEUR    Imaging: No results found.   Medications:   . sodium chloride 500 mL (02/15/18 0047)  . furosemide (LASIX) infusion 8 mg/hr (02/17/18 1658)   . budesonide (PULMICORT) nebulizer solution  0.5 mg Nebulization BID  . cefdinir  300 mg Oral Q12H  . chlorhexidine  15 mL Mouth Rinse BID  . collagenase   Topical Daily  . enoxaparin (LOVENOX) injection  40 mg Subcutaneous Q24H  . gabapentin  800 mg Oral TID  . insulin aspart  0-20 Units Subcutaneous TID WC  . insulin aspart  0-5 Units Subcutaneous QHS  . insulin aspart  6 Units Subcutaneous TID WC  . insulin glargine  28 Units Subcutaneous QHS  . ipratropium-albuterol  3 mL Nebulization Q6H  . lidocaine  2 patch Transdermal Q24H  . lisinopril  10 mg Oral Daily  . mouth rinse  15 mL Mouth Rinse q12n4p  . pantoprazole  40 mg Oral BID  . predniSONE  50 mg Oral Q  breakfast   sodium chloride, acetaminophen **OR** acetaminophen, albuterol, hydrALAZINE, ondansetron **OR** ondansetron (ZOFRAN) IV, oxyCODONE  Assessment/ Plan:  47 y.o. male  with HTN, dCHF, Diabetes with complications (neuropathy), history of positive ANA April 2019,  was admitted on 02/12/2018 with massive volume overload  1.  Acute kidney injury.  Baseline creatinine 1.13 from May/27/2019/GFR > 60 2.  Massive volume overload 3.  Hypoalbuminemia 4.  Diabetes with neuropathy, nephropathy, proteinuria 5.  Hyperkalemia  Plan: Agree with continuing IV Lasix infusion, at present he has good response 2 D Echo = EF 50-55% Potassium is corrected but could go low with diuresis. Monitor closely with Mg Standing weights when possible. Currently 130 kg. Goal 105 kg Will follow   LOS: 5 Daire Okimoto Thedore MinsSingh 8/15/20198:56 AM  [facility] [place], [place] [phone]  Note: This note was prepared with Dragon dictation. Any transcription errors are unintentional

## 2018-02-18 NOTE — Progress Notes (Signed)
Left toe dressing changed. Bothlegs wrapped with compression wraps.  No kerlix on left leg, neither leg is weeping \.

## 2018-02-18 NOTE — Progress Notes (Signed)
Scrotum is still so swollen that I can't visualize any part of his penis. Foley care performed by pouring the provon solution attempting to get it to flow down the catheter.  Scrotum cleansed with wipes.

## 2018-02-19 LAB — BASIC METABOLIC PANEL
Anion gap: 6 (ref 5–15)
BUN: 54 mg/dL — AB (ref 6–20)
CALCIUM: 8.2 mg/dL — AB (ref 8.9–10.3)
CHLORIDE: 99 mmol/L (ref 98–111)
CO2: 34 mmol/L — AB (ref 22–32)
CREATININE: 1.29 mg/dL — AB (ref 0.61–1.24)
GFR calc non Af Amer: >60 mL/min (ref >60)
GLUCOSE: 304 mg/dL — AB (ref 70–99)
Potassium: 4.3 mmol/L (ref 3.5–5.1)
Sodium: 139 mmol/L (ref 135–145)

## 2018-02-19 LAB — GLUCOSE, CAPILLARY
GLUCOSE-CAPILLARY: 242 mg/dL — AB (ref 70–99)
Glucose-Capillary: 261 mg/dL — ABNORMAL HIGH (ref 70–99)
Glucose-Capillary: 279 mg/dL — ABNORMAL HIGH (ref 70–99)
Glucose-Capillary: 219 mg/dL — ABNORMAL HIGH (ref 70–99)

## 2018-02-19 MED ORDER — ORAL CARE MOUTH RINSE
15.0000 mL | Freq: Two times a day (BID) | OROMUCOSAL | Status: DC
  Administered 2018-02-19 – 2018-02-26 (×5): 15 mL via OROMUCOSAL

## 2018-02-19 NOTE — Progress Notes (Signed)
[employer] - [place] at [facility]                                                                                                                                                                                  Patient Demographics   John RiggsMichael Lawson, is a 47 y.o. male, DOB - 03/28/1971, ZOX:[id number]  Admit date - 02/12/2018   Admitting Physician Oralia Manisavid Willis, MD  Outpatient Primary MD for the patient is [facility]   LOS - 6  Subjective: Patient doing better still urine output good    Review of Systems:   CONSTITUTIONAL: No documented fever. No fatigue, weakness. No weight gain, no weight loss.  EYES: No blurry or double vision.  ENT: No tinnitus. No postnasal drip. No redness of the oropharynx.  RESPIRATORY: No cough, no wheeze, no hemoptysis.  Positive dyspnea.  CARDIOVASCULAR: No chest pain. No orthopnea. No palpitations. No syncope.  Positive edema GASTROINTESTINAL: No nausea, no vomiting or diarrhea. No abdominal pain. No melena or hematochezia.  GENITOURINARY: No dysuria or hematuria.  ENDOCRINE: No polyuria or nocturia. No heat or cold intolerance.  HEMATOLOGY: No anemia. No bruising. No bleeding.  INTEGUMENTARY: No rashes. No lesions.  MUSCULOSKELETAL: No arthritis. No swelling. No gout.  NEUROLOGIC: No numbness, tingling, or ataxia. No seizure-type activity.  PSYCHIATRIC: No anxiety. No insomnia. No ADD.    Vitals:   Vitals:   02/19/18 0500 02/19/18 0743 02/19/18 0901 02/19/18 0902  BP:   140/65   Pulse:   91   Resp:   20   Temp:      TempSrc:      SpO2:  92% (!) 89% 95%  Weight: 128.7 kg     Height:        Wt Readings from Last 3 Encounters:  02/19/18 128.7 kg     Intake/Output Summary (Last 24 hours) at 02/19/2018 1426 Last data filed at 02/19/2018 1343 Gross per 24 hour  Intake 576 ml  Output 7225 ml  Net -6649 ml    Physical Exam:   GENERAL: Accessory muscle usage HEAD, EYES, EARS, NOSE AND THROAT:  Atraumatic, normocephalic. Extraocular muscles are intact. Pupils equal and reactive to light. Sclerae anicteric. No conjunctival injection. No oro-pharyngeal erythema.  NECK: Supple. There is no jugular venous distention. No bruits, no lymphadenopathy, no thyromegaly.  HEART: Regular rate and rhythm,. No murmurs, no rubs, no clicks.  LUNGS: Crackles at both bases with accessory muscle usage ABDOMEN: Soft, flat, nontender, nondistended. Has good bowel sounds. No hepatosplenomegaly appreciated.  EXTREMITIES: Positive edema in both lower extremity NEUROLOGIC: Drowsy SKIN: Moist and warm with no rashes appreciated.  Scrotal swelling significant Psych: Not  anxious, depressed LN: No inguinal LN enlargement    Antibiotics   Anti-infectives (From admission, onward)   Start     Dose/Rate Route Frequency Ordered Stop   02/17/18 1000  cefdinir (OMNICEF) capsule 300 mg     300 mg Oral Every 12 hours 02/16/18 1259     02/13/18 1200  cefTRIAXone (ROCEPHIN) 1 g in sodium chloride 0.9 % 100 mL IVPB  Status:  Discontinued     1 g 200 mL/hr over 30 Minutes Intravenous Daily 02/13/18 1125 02/16/18 1259   02/13/18 1200  azithromycin (ZITHROMAX) tablet 500 mg  Status:  Discontinued     500 mg Oral Daily 02/13/18 1125 02/16/18 1259      Medications   Scheduled Meds: . budesonide (PULMICORT) nebulizer solution  0.5 mg Nebulization BID  . cefdinir  300 mg Oral Q12H  . chlorhexidine  15 mL Mouth Rinse BID  . collagenase   Topical Daily  . enoxaparin (LOVENOX) injection  40 mg Subcutaneous Q24H  . gabapentin  800 mg Oral TID  . insulin aspart  0-20 Units Subcutaneous TID WC  . insulin aspart  0-5 Units Subcutaneous QHS  . insulin aspart  10 Units Subcutaneous TID WC  . insulin glargine  35 Units Subcutaneous QHS  . ipratropium-albuterol  3 mL Nebulization Q6H  . lidocaine  2 patch Transdermal Q24H  . lisinopril  10 mg Oral Daily  . mouth rinse  15 mL Mouth Rinse q12n4p  . mouth rinse  15 mL Mouth  Rinse BID  . Melatonin  5 mg Oral QHS  . pantoprazole  40 mg Oral BID  . [START ON 02/20/2018] predniSONE  30 mg Oral Q breakfast   Followed by  . [START ON 02/21/2018] predniSONE  20 mg Oral Q breakfast   Followed by  . [START ON 02/22/2018] predniSONE  10 mg Oral Q breakfast  . zolpidem  10 mg Oral QHS   Continuous Infusions: . sodium chloride 500 mL (02/15/18 0047)  . furosemide (LASIX) infusion 8 mg/hr (02/19/18 0017)   PRN Meds:.sodium chloride, acetaminophen **OR** acetaminophen, albuterol, hydrALAZINE, ondansetron **OR** ondansetron (ZOFRAN) IV, oxyCODONE   Data Review:   Micro Results Recent Results (from the past 240 hour(s))  MRSA PCR Screening     Status: None   Collection Time: 02/13/18 12:23 PM  Result Value Ref Range Status   MRSA by PCR NEGATIVE NEGATIVE Final    Comment:        The GeneXpert MRSA Assay (FDA approved for NASAL specimens only), is one component of a comprehensive MRSA colonization surveillance program. It is not intended to diagnose MRSA infection nor to guide or monitor treatment for MRSA infections. Performed at [facility], [address], [place], [place] [address]     Radiology Reports US Renal  Result Date: 02/13/2018 CLINICAL DATA:  Acute kidney insufficiency EXAM: RENAL / URINARY TRACT ULTRASOUND COMPLETE COMPARISON:  None. FINDINGS: Right Kidney: Length: 12.9 cm. There is diffuse increased echotexture of the right kidney. No mass or hydronephrosis visualized. Left Kidney: Length: 14.5 cm. Echogenicity within normal limits. No mass or hydronephrosis visualized. Bladder: Evaluation of bladder is limited as patient has Foley catheter in place. IMPRESSION: Diffuse increased echotexture of the right kidney. This is nonspecific but can be seen in medical renal disease. No hydronephrosis identified bilaterally. Electronically Signed   By: Sherian Rein M.D.   On: 02/13/2018 16:36   Dg Chest Port 1 View  Result Date:  02/14/2018 CLINICAL DATA:  Atelectasis  EXAM: PORTABLE CHEST 1 VIEW COMPARISON:  02/12/2018 FINDINGS: Increased interstitial markings with a perihilar distribution, increased, favoring mild interstitial edema. Small right pleural effusion. Associated right basilar opacity, likely atelectasis, unchanged. No pneumothorax. Cardiomegaly. IMPRESSION: Cardiomegaly with mild interstitial edema, increased. Small right pleural effusion. Associated right lower lobe opacity, likely atelectasis, unchanged. Electronically Signed   By: Charline Bills M.D.   On: 02/14/2018 07:16   Dg Chest Port 1 View  Result Date: 02/12/2018 CLINICAL DATA:  Acute onset of shortness of breath. Expiratory wheezing. EXAM: PORTABLE CHEST 1 VIEW COMPARISON:  Chest radiograph performed 10/23/2014, and CTA of the chest performed 10/24/2014 FINDINGS: A small right pleural effusion is noted, with associated opacity. Increased interstitial markings may reflect mild interstitial edema. Alternatively, right basilar pneumonia could have a similar appearance. No pneumothorax is seen. The cardiomediastinal silhouette is borderline normal in size. No acute osseous abnormalities are identified. IMPRESSION: Small right pleural effusion noted. Increased interstitial markings may reflect mild interstitial edema. Alternatively, right basilar pneumonia could have a similar appearance. Electronically Signed   By: Roanna Raider M.D.   On: 02/12/2018 02:36     CBC Recent Labs  Lab 02/13/18 0334  HGB 9.7*    Chemistries  Recent Labs  Lab 02/13/18 0334 02/13/18 1613 02/14/18 0024 02/14/18 0802 02/15/18 0336 02/15/18 0704 02/16/18 0506 02/17/18 0521 02/18/18 0450 02/19/18 0445  NA 143 141 140 139 136  --  136 139 138 139  K 5.3* 5.3* 5.4* 5.2* 5.5* 5.3* 5.6* 4.8 4.7 4.3  CL 113* 111 109 107 105  --  104 102 99 99  CO2 21* 25 24 25 24   --  26 28 31  34*  GLUCOSE 227* 233* 229* 367* 504*  --  479* 410* 376* 304*  BUN 42* 45* 45* 46* 51*  --   53* 55* 55* 54*  CREATININE 1.41* 1.45* 1.37* 1.41* 1.47*  --  1.59* 1.54* 1.44* 1.29*  CALCIUM 8.2* 8.0* 8.1* 8.2* 8.4*  --  8.3* 8.4* 8.4* 8.2*  MG 2.0 1.7 1.8 1.9  --   --   --   --  2.0  --   AST  --   --   --   --  17  --   --   --   --   --   ALT  --   --   --   --  26  --   --   --   --   --   ALKPHOS  --   --   --   --  162*  --   --   --   --   --   BILITOT  --   --   --   --  0.5  --   --   --   --   --    ------------------------------------------------------------------------------------------------------------------ estimated creatinine clearance is 98.8 mL/min (A) (by C-G formula based on SCr of 1.29 mg/dL (H)). ------------------------------------------------------------------------------------------------------------------ Recent Labs    02/17/18 0521  HGBA1C 7.5*   ------------------------------------------------------------------------------------------------------------------ No results for input(s): CHOL, HDL, LDLCALC, TRIG, CHOLHDL, LDLDIRECT in the last 72 hours. ------------------------------------------------------------------------------------------------------------------ No results for input(s): TSH, T4TOTAL, T3FREE, THYROIDAB in the last 72 hours.  Invalid input(s): FREET3 ------------------------------------------------------------------------------------------------------------------ No results for input(s): VITAMINB12, FOLATE, FERRITIN, TIBC, IRON, RETICCTPCT in the last 72 hours.  Coagulation profile No results for input(s): INR, PROTIME in the last 168 hours.  No results for input(s): DDIMER in the last 72 hours.  Cardiac Enzymes No results  for input(s): CKMB, TROPONINI, MYOGLOBIN in the last 168 hours.  Invalid input(s): CK ------------------------------------------------------------------------------------------------------------------ Invalid input(s): POCBNP    Assessment & Plan  Patient is 47 year old noncompliant with his CHF regimen  presenting with worsening shortness of breath and swelling   1.  Acute hypoxic respiratory failure severe fluid overload Continue continue therapy with IV Lasix drip  2.Acute on chronic diastolic CHF (congestive heart failure) (HCC) - Continue IV Lasix drip and has put out more than 45 L of fluid Strongly recommended patient adhere to his diet and fluid intake Echocardiogram shows no significant changes  3.    Accelerated hypertension pressure stable Continue lisinopril add IV hydralazine as needed blood pressure stable  4  COPD with possible exacerbation continue nebulizer therapy. predinosone taper  5  Diabetes (HCC) -sliding scale insulin with corresponding glucose checks  Patient's blood glucose labile  6  Leg wounds and diabetic foot wound -blistering leg wounds due to his edema, treatment for edema as above, wound consult for treatment of the blistering wounds as well as his healing diabetic foot wound  7.  GERD continue Protonix      Code Status Orders  (From admission, onward)         Start     Ordered   02/12/18 0339  Full code  Continuous     02/12/18 0338        Code Status History    This patient has a current code status but no historical code status.           Consults cardiology  DVT Prophylaxis  Lovenox  Lab Results  Component Value Date   PLT 332 02/12/2018     Time Spent in minutes   35 minutes spent Auburn BilberryShreyang Ascencion Stegner M.D on 02/19/2018 at 2:26 PM  Between 7am to 6pm - Pager - [phone]  After 6pm go to [email or web address] - Social research officer, governmentpassword [email or web address]  [employer]   Office  [phone]

## 2018-02-19 NOTE — Progress Notes (Signed)
[facility] [place], [place] 02/19/18  Subjective:   Doing fair Appetite is good Good amount of diuresis with lasix infusion. Another 5.6 Liters Creatinine improved, potassium normal  Objective:  Vital signs in last 24 hours:  Temp:  [98.3 F (36.8 C)] 98.3 F (36.8 C) (08/15 1603) Pulse Rate:  [87-91] 91 (08/16 0901) Resp:  [18-20] 20 (08/16 0901) BP: (138-140)/(65) 140/65 (08/16 0901) SpO2:  [89 %-96 %] 95 % (08/16 0902) Weight:  [128.7 kg] 128.7 kg (08/16 0500)  Weight change: -1.12 kg Filed Weights   02/17/18 0505 02/18/18 0319 02/19/18 0500  Weight: 133.8 kg 129.8 kg 128.7 kg    Intake/Output:    Intake/Output Summary (Last 24 hours) at 02/19/2018 1349 Last data filed at 02/19/2018 1343 Gross per 24 hour  Intake 576 ml  Output 7225 ml  Net -6649 ml    Physical Exam: General:  No acute distress, laying in the bed  HEENT  anicteric, moist oral mucous membranes  Neck:  Supple  Lungs:  Mild bibasilar crackles  Heart::  No rub, regular rhythm  Abdomen:  Soft, nontender  Extremities:  3+ severe pitting edema, left big toe amputation, right toe amputation  Neurologic:   Resting quietly small muscle wasting in hands b/l  Skin:  Congestive hyperemia over leg  Foley:  In place      Basic Metabolic Panel:  Recent Labs  Lab 02/13/18 0334 02/13/18 1613 02/14/18 0024 02/14/18 0802 02/15/18 [id number] 02/15/18 0704 02/16/18 0506 02/17/18 0521 02/18/18 0450 02/19/18 0445  NA 143 141 140 139 136  --  136 139 138 139  K 5.3* 5.3* 5.4* 5.2* 5.5* 5.3* 5.6* 4.8 4.7 4.3  CL 113* 111 109 107 105  --  104 102 99 99  CO2 21* 25 24 25 24   --  26 28 31  34*  GLUCOSE 227* 233* 229* 367* 504*  --  479* 410* 376* 304*  BUN 42* 45* 45* 46* 51*  --  53* 55* 55* 54*  CREATININE 1.41* 1.45* 1.37* 1.41* 1.47*  --  1.59* 1.54* 1.44* 1.29*  CALCIUM 8.2* 8.0* 8.1* 8.2* 8.4*  --  8.3* 8.4* 8.4* 8.2*  MG 2.0 1.7 1.8 1.9  --   --   --   --  2.0  --   PHOS  --  4.5  4.0 4.3  --   --   --   --   --   --      CBC: Recent Labs  Lab 02/13/18 0334  HGB 9.7*     No results found for: HEPBSAG, HEPBSAB, HEPBIGM    Microbiology:  Recent Results (from the past 240 hour(s))  MRSA PCR Screening     Status: None   Collection Time: 02/13/18 12:23 PM  Result Value Ref Range Status   MRSA by PCR NEGATIVE NEGATIVE Final    Comment:        The GeneXpert MRSA Assay (FDA approved for NASAL specimens only), is one component of a comprehensive MRSA colonization surveillance program. It is not intended to diagnose MRSA infection nor to guide or monitor treatment for MRSA infections. Performed at [facility], [address] [place], [place] [id number]     Coagulation Studies: No results for input(s): LABPROT, INR in the last 72 hours.  Urinalysis: No results for input(s): COLORURINE, LABSPEC, PHURINE, GLUCOSEU, HGBUR, BILIRUBINUR, KETONESUR, PROTEINUR, UROBILINOGEN, NITRITE, LEUKOCYTESUR in the last 72 hours.  Invalid input(s): APPERANCEUR    Imaging: No results found.  Medications:   . sodium chloride 500 mL (02/15/18 0047)  . furosemide (LASIX) infusion 8 mg/hr (02/19/18 0017)   . budesonide (PULMICORT) nebulizer solution  0.5 mg Nebulization BID  . cefdinir  300 mg Oral Q12H  . chlorhexidine  15 mL Mouth Rinse BID  . collagenase   Topical Daily  . enoxaparin (LOVENOX) injection  40 mg Subcutaneous Q24H  . gabapentin  800 mg Oral TID  . insulin aspart  0-20 Units Subcutaneous TID WC  . insulin aspart  0-5 Units Subcutaneous QHS  . insulin aspart  10 Units Subcutaneous TID WC  . insulin glargine  35 Units Subcutaneous QHS  . ipratropium-albuterol  3 mL Nebulization Q6H  . lidocaine  2 patch Transdermal Q24H  . lisinopril  10 mg Oral Daily  . mouth rinse  15 mL Mouth Rinse q12n4p  . mouth rinse  15 mL Mouth Rinse BID  . Melatonin  5 mg Oral QHS  . pantoprazole  40 mg Oral BID  . [START ON 02/20/2018] predniSONE  30 mg  Oral Q breakfast   Followed by  . [START ON 02/21/2018] predniSONE  20 mg Oral Q breakfast   Followed by  . [START ON 02/22/2018] predniSONE  10 mg Oral Q breakfast  . zolpidem  10 mg Oral QHS   sodium chloride, acetaminophen **OR** acetaminophen, albuterol, hydrALAZINE, ondansetron **OR** ondansetron (ZOFRAN) IV, oxyCODONE  Assessment/ Plan:  47 y.o. male  with HTN, dCHF, Diabetes with complications (neuropathy), history of positive ANA April 2019,  was admitted on 02/12/2018 with massive volume overload  1.  Acute kidney injury.  Baseline creatinine 1.13 from May/27/2019/GFR > 60 2.  Massive volume overload 3.  Hypoalbuminemia 4.  Diabetes with neuropathy, nephropathy, proteinuria 5.  Hyperkalemia  Plan: Agree with continuing IV Lasix infusion, at present he has good response 2 D Echo = EF 50-55% Potassium is corrected but could go low with diuresis. Monitor closely with Mg Standing weights when possible. Currently 130 kg. Goal 105 kg Will follow   LOS: 6 John Lawson Thedore MinsSingh 8/16/20191:[facility]  [facility] [place], [place] [phone]  Note: This note was prepared with Dragon dictation. Any transcription errors are unintentional

## 2018-02-20 LAB — BASIC METABOLIC PANEL
ANION GAP: 7 (ref 5–15)
BUN: 48 mg/dL — ABNORMAL HIGH (ref 6–20)
CALCIUM: 8.1 mg/dL — AB (ref 8.9–10.3)
CO2: 34 mmol/L — ABNORMAL HIGH (ref 22–32)
CREATININE: 1.42 mg/dL — AB (ref 0.61–1.24)
Chloride: 97 mmol/L — ABNORMAL LOW (ref 98–111)
GFR calc non Af Amer: 57 mL/min — ABNORMAL LOW (ref >60)
Glucose, Bld: 430 mg/dL — ABNORMAL HIGH (ref 70–99)
Potassium: 4.1 mmol/L (ref 3.5–5.1)
Sodium: 138 mmol/L (ref 135–145)

## 2018-02-20 LAB — GLUCOSE, CAPILLARY
GLUCOSE-CAPILLARY: 259 mg/dL — AB (ref 70–99)
Glucose-Capillary: 301 mg/dL — ABNORMAL HIGH (ref 70–99)
Glucose-Capillary: 133 mg/dL — ABNORMAL HIGH (ref 70–99)

## 2018-02-20 MED ORDER — INSULIN GLARGINE 100 UNIT/ML ~~LOC~~ SOLN
38.0000 [IU] | Freq: Every day | SUBCUTANEOUS | Status: DC
  Administered 2018-02-20: 38 [IU] via SUBCUTANEOUS
  Filled 2018-02-20 (×2): qty 0.38

## 2018-02-20 NOTE — Progress Notes (Signed)
[employer] - [place] [facility]                                                                                                                                                                                  Patient Demographics   John RiggsMichael Lawson, is a 47 y.o. male, DOB - 03/11/1971, WUJ:[id number]  Admit date - 02/12/2018   Admitting Physician Oralia Manisavid Willis, MD  Outpatient Primary MD for the patient is [facility]   LOS - 7  Subjective: And is doing well, denies shortness of breath, patient is resting comfortably.  Patient is on Lasix drip.  Today weight is around 125 kg, baseline is around 105 kg.   Review of Systems:   CONSTITUTIONAL: No documented fever. No fatigue, weakness. No weight gain, no weight loss.  EYES: No blurry or double vision.  ENT: No tinnitus. No postnasal drip. No redness of the oropharynx.  RESPIRATORY: No cough, no wheeze, no hemoptysis.  Positive dyspnea.  CARDIOVASCULAR: No chest pain. No orthopnea. No palpitations. No syncope.  Positive edema GASTROINTESTINAL: No nausea, no vomiting or diarrhea. No abdominal pain. No melena or hematochezia.  GENITOURINARY: No dysuria or hematuria.  ENDOCRINE: No polyuria or nocturia. No heat or cold intolerance.  HEMATOLOGY: No anemia. No bruising. No bleeding.  INTEGUMENTARY: No rashes. No lesions.  MUSCULOSKELETAL: No arthritis. No swelling. No gout.  NEUROLOGIC: No numbness, tingling, or ataxia. No seizure-type activity.  PSYCHIATRIC: No anxiety. No insomnia. No ADD.    Vitals:   Vitals:   02/20/18 0215 02/20/18 0435 02/20/18 0856 02/20/18 0858  BP:  (!) 157/74  (!) 158/80  Pulse:  80 83 81  Resp:  18  20  Temp:  97.8 F (36.6 C)  98 F (36.7 C)  TempSrc:  Oral  Oral  SpO2: 96% 96% 96% 95%  Weight:  124.8 kg    Height:        Wt Readings from Last 3 Encounters:  02/20/18 124.8 kg     Intake/Output Summary (Last 24 hours) at 02/20/2018 1041 Last data filed at  02/20/2018 1016 Gross per 24 hour  Intake 480 ml  Output 7150 ml  Net -6670 ml    Physical Exam:   GENERAL: Accessory muscle usage HEAD, EYES, EARS, NOSE AND THROAT: Atraumatic, normocephalic. Extraocular muscles are intact. Pupils equal and reactive to light. Sclerae anicteric. No conjunctival injection. No oro-pharyngeal erythema.  NECK: Supple. There is no jugular venous distention. No bruits, no lymphadenopathy, no thyromegaly.  HEART: Regular rate and rhythm,. No murmurs, no rubs, no clicks.  LUNGS: Crackles at both bases r, nondistended. Has good bowel sounds. No hepatosplenomegaly appreciated.  EXTREMITIES:  Positive edema in both lower extremity, better than before. NEUROLOGIC: Drowsy SKIN: Moist and warm with no rashes appreciated.  Scrotal swelling significant Psych: Not anxious, depressed LN: No inguinal LN enlargement    Antibiotics   Anti-infectives (From admission, onward)   Start     Dose/Rate Route Frequency Ordered Stop   02/17/18 1000  cefdinir (OMNICEF) capsule 300 mg     300 mg Oral Every 12 hours 02/16/18 1259     02/13/18 1200  cefTRIAXone (ROCEPHIN) 1 g in sodium chloride 0.9 % 100 mL IVPB  Status:  Discontinued     1 g 200 mL/hr over 30 Minutes Intravenous Daily 02/13/18 1125 02/16/18 1259   02/13/18 1200  azithromycin (ZITHROMAX) tablet 500 mg  Status:  Discontinued     500 mg Oral Daily 02/13/18 1125 02/16/18 1259      Medications   Scheduled Meds: . budesonide (PULMICORT) nebulizer solution  0.5 mg Nebulization BID  . cefdinir  300 mg Oral Q12H  . chlorhexidine  15 mL Mouth Rinse BID  . collagenase   Topical Daily  . enoxaparin (LOVENOX) injection  40 mg Subcutaneous Q24H  . gabapentin  800 mg Oral TID  . insulin aspart  0-20 Units Subcutaneous TID WC  . insulin aspart  0-5 Units Subcutaneous QHS  . insulin aspart  10 Units Subcutaneous TID WC  . insulin glargine  35 Units Subcutaneous QHS  . ipratropium-albuterol  3 mL Nebulization Q6H  .  lidocaine  2 patch Transdermal Q24H  . lisinopril  10 mg Oral Daily  . mouth rinse  15 mL Mouth Rinse q12n4p  . mouth rinse  15 mL Mouth Rinse BID  . Melatonin  5 mg Oral QHS  . pantoprazole  40 mg Oral BID  . [START ON 02/21/2018] predniSONE  20 mg Oral Q breakfast   Followed by  . [START ON 02/22/2018] predniSONE  10 mg Oral Q breakfast  . zolpidem  10 mg Oral QHS   Continuous Infusions: . sodium chloride 500 mL (02/15/18 0047)  . furosemide (LASIX) infusion 8 mg/hr (02/19/18 0017)   PRN Meds:.sodium chloride, acetaminophen **OR** acetaminophen, albuterol, hydrALAZINE, ondansetron **OR** ondansetron (ZOFRAN) IV, oxyCODONE   Data Review:   Micro Results Recent Results (from the past 240 hour(s))  MRSA PCR Screening     Status: None   Collection Time: 02/13/18 12:23 PM  Result Value Ref Range Status   MRSA by PCR NEGATIVE NEGATIVE Final    Comment:        The GeneXpert MRSA Assay (FDA approved for NASAL specimens only), is one component of a comprehensive MRSA colonization surveillance program. It is not intended to diagnose MRSA infection nor to guide or monitor treatment for MRSA infections. Performed at [facility], [address], [place], [place] [address]     Radiology Reports US Renal  Result Date: 02/13/2018 CLINICAL DATA:  Acute kidney insufficiency EXAM: RENAL / URINARY TRACT ULTRASOUND COMPLETE COMPARISON:  None. FINDINGS: Right Kidney: Length: 12.9 cm. There is diffuse increased echotexture of the right kidney. No mass or hydronephrosis visualized. Left Kidney: Length: 14.5 cm. Echogenicity within normal limits. No mass or hydronephrosis visualized. Bladder: Evaluation of bladder is limited as patient has Foley catheter in place. IMPRESSION: Diffuse increased echotexture of the right kidney. This is nonspecific but can be seen in medical renal disease. No hydronephrosis identified bilaterally. Electronically Signed   By: Sherian Rein M.D.   On:  02/13/2018 16:36   Dg Chest Port 1  View  Result Date: 02/14/2018 CLINICAL DATA:  Atelectasis EXAM: PORTABLE CHEST 1 VIEW COMPARISON:  02/12/2018 FINDINGS: Increased interstitial markings with a perihilar distribution, increased, favoring mild interstitial edema. Small right pleural effusion. Associated right basilar opacity, likely atelectasis, unchanged. No pneumothorax. Cardiomegaly. IMPRESSION: Cardiomegaly with mild interstitial edema, increased. Small right pleural effusion. Associated right lower lobe opacity, likely atelectasis, unchanged. Electronically Signed   By: Charline Bills M.D.   On: 02/14/2018 07:16   Dg Chest Port 1 View  Result Date: 02/12/2018 CLINICAL DATA:  Acute onset of shortness of breath. Expiratory wheezing. EXAM: PORTABLE CHEST 1 VIEW COMPARISON:  Chest radiograph performed 10/23/2014, and CTA of the chest performed 10/24/2014 FINDINGS: A small right pleural effusion is noted, with associated opacity. Increased interstitial markings may reflect mild interstitial edema. Alternatively, right basilar pneumonia could have a similar appearance. No pneumothorax is seen. The cardiomediastinal silhouette is borderline normal in size. No acute osseous abnormalities are identified. IMPRESSION: Small right pleural effusion noted. Increased interstitial markings may reflect mild interstitial edema. Alternatively, right basilar pneumonia could have a similar appearance. Electronically Signed   By: Roanna Raider M.D.   On: 02/12/2018 02:36     CBC No results for input(s): WBC, HGB, HCT, PLT, MCV, MCH, MCHC, RDW, LYMPHSABS, MONOABS, EOSABS, BASOSABS, BANDABS in the last 168 hours.  Invalid input(s): NEUTRABS, BANDSABD  Chemistries  Recent Labs  Lab 02/13/18 1613 02/14/18 0024 02/14/18 0802 02/15/18 0336  02/16/18 0506 02/17/18 0521 02/18/18 0450 02/19/18 0445 02/20/18 0453  NA 141 140 139 136  --  136 139 138 139 138  K 5.3* 5.4* 5.2* 5.5*   < > 5.6* 4.8 4.7 4.3 4.1  CL  111 109 107 105  --  104 102 99 99 97*  CO2 25 24 25 24   --  26 28 31  34* 34*  GLUCOSE 233* 229* 367* 504*  --  479* 410* 376* 304* 430*  BUN 45* 45* 46* 51*  --  53* 55* 55* 54* 48*  CREATININE 1.45* 1.37* 1.41* 1.47*  --  1.59* 1.54* 1.44* 1.29* 1.42*  CALCIUM 8.0* 8.1* 8.2* 8.4*  --  8.3* 8.4* 8.4* 8.2* 8.1*  MG 1.7 1.8 1.9  --   --   --   --  2.0  --   --   AST  --   --   --  17  --   --   --   --   --   --   ALT  --   --   --  26  --   --   --   --   --   --   ALKPHOS  --   --   --  162*  --   --   --   --   --   --   BILITOT  --   --   --  0.5  --   --   --   --   --   --    < > = values in this interval not displayed.   ------------------------------------------------------------------------------------------------------------------ estimated creatinine clearance is 88.3 mL/min (A) (by C-G formula based on SCr of 1.42 mg/dL (H)). ------------------------------------------------------------------------------------------------------------------ No results for input(s): HGBA1C in the last 72 hours. ------------------------------------------------------------------------------------------------------------------ No results for input(s): CHOL, HDL, LDLCALC, TRIG, CHOLHDL, LDLDIRECT in the last 72 hours. ------------------------------------------------------------------------------------------------------------------ No results for input(s): TSH, T4TOTAL, T3FREE, THYROIDAB in the last 72 hours.  Invalid input(s): FREET3 ------------------------------------------------------------------------------------------------------------------ No results for input(s): VITAMINB12, FOLATE, FERRITIN,  TIBC, IRON, RETICCTPCT in the last 72 hours.  Coagulation profile No results for input(s): INR, PROTIME in the last 168 hours.  No results for input(s): DDIMER in the last 72 hours.  Cardiac Enzymes No results for input(s): CKMB, TROPONINI, MYOGLOBIN in the last 168 hours.  Invalid input(s):  CK ------------------------------------------------------------------------------------------------------------------ Invalid input(s): POCBNP    Assessment & Plan  Patient is 47 year old noncompliant with his CHF regimen presenting with worsening shortness of breath and swelling   1.  Acute hypoxic respiratory failure severe fluid overload Continue continue therapy with IV Lasix drip Hypoxia improved, patient saturation 95% on room air.  Spoke with Dr. Mosetta PigeonHarmeet Singh, recommended to continue Lasix drip for another 24 hours, likely discharge on Monday with p.o. Lasix.  2.Acute on chronic diastolic CHF (congestive heart failure) (HCC) - Continue IV Lasix drip Strongly recommended patient adhere to his diet and fluid intake, discharge weight goal is around 105 kg. Echocardiogram shows no significant changes  3.    Accelerated hypertension pressure stable Continue lisinopril add IV hydralazine as needed blood pressure stable  4  COPD with possible exacerbation continue nebulizer therapy. predinosone taper, no wheezing now.  On empiric antibiotics Omnicef..  5  Diabetes (HCC) -sliding scale insulin with corresponding glucose checks  Uncontrolled now, blood glucose more than 200s.  Annual Lantus, sliding scale insulin with coverage.  Had history of labile blood glucose.  6  Leg wounds and diabetic foot wound -blistering leg wounds due to his edema, treatment for edema as above, wound consult for treatment of the blistering wounds as well as his healing diabetic foot wound  7.  GERD continue Protonix      Code Status Orders  (From admission, onward)         Start     Ordered   02/12/18 0339  Full code  Continuous     02/12/18 0338        Code Status History    This patient has a current code status but no historical code status.           Consults cardiology  DVT Prophylaxis  Lovenox  Lab Results  Component Value Date   PLT 332 02/12/2018     Time Spent in  minutes   35 minutes spent Katha HammingSnehalatha Telia Amundson M.D on 02/20/2018 at 10:41 AM  Between 7am to 6pm - Pager - [phone]  After 6pm go to [email or web address] - Social research officer, governmentpassword [email or web address]  [employer]   Office  [phone]

## 2018-02-20 NOTE — Plan of Care (Signed)
  Problem: Health Behavior/Discharge Planning: Goal: Ability to manage health-related needs will improve Outcome: Progressing   Problem: Activity: Goal: Risk for activity intolerance will decrease Outcome: Progressing   

## 2018-02-20 NOTE — Progress Notes (Signed)
PATIENT PRESENTLY LYING IN THE BED, DENIES ANY PAIN AT THIS TIME, IV LASIX INFUSING AT 8ML/HR, FOLEY CATHETER IN PLACE AND PATENT , PATIENT REFUSED CATHETER CARE AND ASSISTANCE WITH ADL CARE

## 2018-02-20 NOTE — Progress Notes (Signed)
Physical Therapy Treatment Patient Details Name: John RiggsMichael Mccoll MRN: [id number] DOB: 05/26/1971 Today's Date: 02/20/2018    History of Present Illness John Lawson is a 47 y.o. male brought to the ED from home via EMS with a chief complaint of shortness of breath.  Patient has a history of hypertension, diabetes, CHF who ran out of his Lasix 1 week ago.  Presents with a 50 pound weight gain over the past week, diffuse swelling and shortness of breath with wheezing.  Also complains of chest tightness. PMH of L toe amputation. R foot open ulcers.     PT Comments    Pt initially refusing and argumentative regarding session.  Voiced being upset that session was limited last visit.  Explained to pt that PT limited session due to O2 sats and he stated that he was not going to do it unless he could walk further.  Attempted to educate pt that we would monitor sats and make appropriate judgement calls during session.  He refused mobility and further intervention was stopped as he was becoming more argumentative.  Upon attempting leaving room, pt became angry that we were leaving "I never said I wouldn't do it, don't just walk away from me."  Reviewed interaction with pt and explained we were happy to stay and work with him but it needed to be in a safe manner.  Pt transitioned out of bed with rails.  Self-initiated raising bed so feet were about 2 feet off floor then slid off bed to stand.  Initially unsteady without UE support and needed to lean back on bed for balance.  Pt was able to walk around unit x 1 with walker and min assist +1 and +1 for IV pole and general safety of pt and staff.  He did voice being tired upon return to room but had no decrease in gait quality or speed.  Pt on room air - 95% at rest, 91% after gait.  Overall pt did well this session but general safety and agitation require +2 assist for mobility.  Catheter bag was emptied prior to gait as it was very full.  Measured and written on white  board in room.  Relayed to primary nurse to document.   Follow Up Recommendations  Home health PT     Equipment Recommendations  Rolling walker with 5" wheels    Recommendations for Other Services       Precautions / Restrictions Precautions Precautions: Fall Precaution Comments: watch O2 Restrictions Weight Bearing Restrictions: No Other Position/Activity Restrictions: L toe amputation. BLE     Mobility  Bed Mobility Overal bed mobility: Modified Independent                Transfers Overall transfer level: Modified independent   Transfers: Sit to/from Stand Sit to Stand: Modified independent (Device/Increase time)         General transfer comment: Self elevates bed so feet are about 2 feet off floor and slides off bed to stand.  attemtped to intervene but he resists.  Ambulation/Gait Ambulation/Gait assistance: Min assist Gait Distance (Feet): 180 Feet Assistive device: Rolling walker (2 wheeled) Gait Pattern/deviations: Step-through pattern Gait velocity: decreased Gait velocity interpretation: <1.8 ft/sec, indicate of risk for recurrent falls General Gait Details: B ortho shoes, generally unsteady but overall able to tolerate one lap around unit.   Stairs             Wheelchair Mobility    Modified Rankin (Stroke Patients Only)  Balance Overall balance assessment: Needs assistance Sitting-balance support: Bilateral upper extremity supported;Feet supported Sitting balance-Leahy Scale: Good     Standing balance support: Bilateral upper extremity supported Standing balance-Leahy Scale: Fair Standing balance comment: Pt can remove BUE from walker but is unsteady when he does so.                             Cognition Arousal/Alertness: Awake/alert Behavior During Therapy: Agitated;Impulsive                                   General Comments: pt was difficult to understand      Exercises      General  Comments        Pertinent Vitals/Pain Pain Assessment: 0-10 Pain Score: 6  Pain Location: Scrotum Pain Descriptors / Indicators: Constant;Sore Pain Intervention(s): Limited activity within patient's tolerance    Home Living                      Prior Function            PT Goals (current goals can now be found in the care plan section) Progress towards PT goals: Progressing toward goals    Frequency    Min 2X/week      PT Plan Current plan remains appropriate    Co-evaluation              AM-PAC PT "6 Clicks" Daily Activity  Outcome Measure  Difficulty turning over in bed (including adjusting bedclothes, sheets and blankets)?: None Difficulty moving from lying on back to sitting on the side of the bed? : None Difficulty sitting down on and standing up from a chair with arms (e.g., wheelchair, bedside commode, etc,.)?: Unable Help needed moving to and from a bed to chair (including a wheelchair)?: A Little Help needed walking in hospital room?: A Little Help needed climbing 3-5 steps with a railing? : A Lot 6 Click Score: 17    End of Session Equipment Utilized During Treatment: Gait belt Activity Tolerance: Patient tolerated treatment well;Patient limited by pain;Patient limited by fatigue Patient left: in bed;with bed alarm set;with call bell/phone within reach Nurse Communication: Mobility status;Other (comment)       Time: [id number] PT Time Calculation (min) (ACUTE ONLY): 23 min  Charges:  $Gait Training: 23-37 mins                     Danielle DessSarah Cheral Cappucci, PTA 02/20/18, 12:06 PM

## 2018-02-20 NOTE — Progress Notes (Signed)
[facility] [place], [place] 02/20/18  Subjective:   Doing fair Appetite is good Good amount of diuresis with lasix infusion. Another 7 Liters urine output yesterday Creatinine in the usual range, potassium normal Overall diuresis of 50 L so far   Objective:  Vital signs in last 24 hours:  Temp:  [97.8 F (36.6 C)-98.9 F (37.2 C)] 98 F (36.7 C) (08/17 0858) Pulse Rate:  [80-91] 81 (08/17 0858) Resp:  [18-20] 20 (08/17 0858) BP: (127-158)/(60-80) 158/80 (08/17 0858) SpO2:  [94 %-98 %] 95 % (08/17 0858) Weight:  [124.8 kg] 124.8 kg (08/17 0435)  Weight change: -3.87 kg Filed Weights   02/18/18 0319 02/19/18 0500 02/20/18 0435  Weight: 129.8 kg 128.7 kg 124.8 kg    Intake/Output:    Intake/Output Summary (Last 24 hours) at 02/20/2018 1223 Last data filed at 02/20/2018 1148 Gross per 24 hour  Intake 480 ml  Output 9100 ml  Net -8620 ml    Physical Exam: General:  No acute distress, laying in the bed  HEENT  anicteric, moist oral mucous membranes  Neck:  Supple  Lungs:  Mild bibasilar crackles  Heart::  No rub, regular rhythm  Abdomen:  Soft, nontender  Extremities: 2- 3+ severe pitting edema,   Neurologic:   Resting quietly small muscle wasting in hands b/l  Skin:  Congestive hyperemia over leg  Foley:  In place, scrotal swelling      Basic Metabolic Panel:  Recent Labs  Lab 02/13/18 1613 02/14/18 0024 02/14/18 0802  02/16/18 0506 02/17/18 0521 02/18/18 0450 02/19/18 0445 02/20/18 0453  NA 141 140 139   < > 136 139 138 139 138  K 5.3* 5.4* 5.2*   < > 5.6* 4.8 4.7 4.3 4.1  CL 111 109 107   < > 104 102 99 99 97*  CO2 25 24 25    < > 26 28 31  34* 34*  GLUCOSE 233* 229* 367*   < > 479* 410* 376* 304* 430*  BUN 45* 45* 46*   < > 53* 55* 55* 54* 48*  CREATININE 1.45* 1.37* 1.41*   < > 1.59* 1.54* 1.44* 1.29* 1.42*  CALCIUM 8.0* 8.1* 8.2*   < > 8.3* 8.4* 8.4* 8.2* 8.1*  MG 1.7 1.8 1.9  --   --   --  2.0  --   --   PHOS 4.5 4.0 4.3   --   --   --   --   --   --    < > = values in this interval not displayed.     CBC: No results for input(s): WBC, NEUTROABS, HGB, HCT, MCV, PLT in the last 168 hours.   No results found for: HEPBSAG, HEPBSAB, HEPBIGM    Microbiology:  Recent Results (from the past 240 hour(s))  MRSA PCR Screening     Status: None   Collection Time: 02/13/18 12:23 PM  Result Value Ref Range Status   MRSA by PCR NEGATIVE NEGATIVE Final    Comment:        The GeneXpert MRSA Assay (FDA approved for NASAL specimens only), is one component of a comprehensive MRSA colonization surveillance program. It is not intended to diagnose MRSA infection nor to guide or monitor treatment for MRSA infections. Performed at [facility], [address] [place], [place] [address]     Coagulation Studies: No results for input(s): LABPROT, INR in the last 72 hours.  Urinalysis: No results for input(s): COLORURINE, LABSPEC, PHURINE, GLUCOSEU, HGBUR,  BILIRUBINUR, KETONESUR, PROTEINUR, UROBILINOGEN, NITRITE, LEUKOCYTESUR in the last 72 hours.  Invalid input(s): APPERANCEUR    Imaging: No results found.   Medications:   . sodium chloride 500 mL (02/15/18 0047)  . furosemide (LASIX) infusion 8 mg/hr (02/20/18 1151)   . budesonide (PULMICORT) nebulizer solution  0.5 mg Nebulization BID  . cefdinir  300 mg Oral Q12H  . chlorhexidine  15 mL Mouth Rinse BID  . collagenase   Topical Daily  . enoxaparin (LOVENOX) injection  40 mg Subcutaneous Q24H  . gabapentin  800 mg Oral TID  . insulin aspart  0-20 Units Subcutaneous TID WC  . insulin aspart  0-5 Units Subcutaneous QHS  . insulin aspart  10 Units Subcutaneous TID WC  . insulin glargine  38 Units Subcutaneous QHS  . ipratropium-albuterol  3 mL Nebulization Q6H  . lidocaine  2 patch Transdermal Q24H  . lisinopril  10 mg Oral Daily  . mouth rinse  15 mL Mouth Rinse q12n4p  . mouth rinse  15 mL Mouth Rinse BID  . Melatonin  5 mg Oral QHS  .  pantoprazole  40 mg Oral BID  . [START ON 02/21/2018] predniSONE  20 mg Oral Q breakfast   Followed by  . [START ON 02/22/2018] predniSONE  10 mg Oral Q breakfast  . zolpidem  10 mg Oral QHS   sodium chloride, acetaminophen **OR** acetaminophen, albuterol, hydrALAZINE, ondansetron **OR** ondansetron (ZOFRAN) IV, oxyCODONE  Assessment/ Plan:  47 y.o. male  with HTN, dCHF, Diabetes with complications (neuropathy), history of positive ANA April 2019,  was admitted on 02/12/2018 with massive volume overload  1.  Acute kidney injury.  Baseline creatinine 1.13 from May/27/2019/GFR > 60 2.  Massive volume overload 3.  Hypoalbuminemia 4.  Diabetes with neuropathy, nephropathy, proteinuria 5.  Hyperkalemia  Plan: Agree with continuing IV Lasix infusion, at present he has good response 2 D Echo = EF 50-55% Monitor electrolytes closely including potassium and magnesium Standing weights when possible. Currently 125kg. Goal 110 kg Consider possibly switching to oral torsemide on Monday   LOS: 7 Leanah Kolander 8/17/201912:23 PM  [facility] [place], [place] [phone]  Note: This note was prepared with Dragon dictation. Any transcription errors are unintentional

## 2018-02-21 LAB — BASIC METABOLIC PANEL
Anion gap: 6 (ref 5–15)
BUN: 56 mg/dL — AB (ref 6–20)
CALCIUM: 8.1 mg/dL — AB (ref 8.9–10.3)
CO2: 35 mmol/L — AB (ref 22–32)
Chloride: 99 mmol/L (ref 98–111)
Creatinine, Ser: 1.38 mg/dL — ABNORMAL HIGH (ref 0.61–1.24)
GFR calc Af Amer: >60 mL/min (ref >60)
GFR, EST NON AFRICAN AMERICAN: 59 mL/min — AB (ref >60)
GLUCOSE: 233 mg/dL — AB (ref 70–99)
Potassium: 4.2 mmol/L (ref 3.5–5.1)
Sodium: 140 mmol/L (ref 135–145)

## 2018-02-21 LAB — GLUCOSE, CAPILLARY
GLUCOSE-CAPILLARY: 179 mg/dL — AB (ref 70–99)
GLUCOSE-CAPILLARY: 134 mg/dL — AB (ref 70–99)
GLUCOSE-CAPILLARY: 87 mg/dL (ref 70–99)
Glucose-Capillary: 291 mg/dL — ABNORMAL HIGH (ref 70–99)
Glucose-Capillary: 305 mg/dL — ABNORMAL HIGH (ref 70–99)

## 2018-02-21 MED ORDER — INSULIN GLARGINE 100 UNIT/ML ~~LOC~~ SOLN
40.0000 [IU] | Freq: Every day | SUBCUTANEOUS | Status: DC
  Administered 2018-02-22: 40 [IU] via SUBCUTANEOUS
  Filled 2018-02-21 (×2): qty 0.4

## 2018-02-21 MED ORDER — INSULIN ASPART 100 UNIT/ML ~~LOC~~ SOLN
13.0000 [IU] | Freq: Three times a day (TID) | SUBCUTANEOUS | Status: DC
  Administered 2018-02-21 – 2018-02-22 (×2): 13 [IU] via SUBCUTANEOUS
  Filled 2018-02-21 (×2): qty 1

## 2018-02-21 NOTE — Progress Notes (Signed)
Patient proceeds to ask me at this time of day if I am his doctor or his nurse. John FavreSteven M [facility]

## 2018-02-21 NOTE — Progress Notes (Signed)
Wound care to R 2nd toe completed at this time. Patient tolerated well. Patient refused reapplication of ACE wraps after I removed the old set. Left 2 new rolls with patient upon his request. Also gave patient a new package of XXL blue slippers to use at home at his request as well. Will continue to monitor wound dressing status. Jari FavreSteven M [facility]

## 2018-02-21 NOTE — Progress Notes (Addendum)
[employer] - [place] [facility]                                                                                                                                                                                  Patient Demographics   John RiggsMichael Lawson, is a 47 y.o. male, DOB - 03/20/1971, ZOX:[id number]  Admit date - 02/12/2018   Admitting Physician Oralia Manisavid Willis, MD  Outpatient Primary MD for the patient is [facility]   LOS - 8  Subjective: Says he is doing better, decreased shortness of breath, decreased leg edema.  Hemodynamically stable, weight is further down.  Foley catheter is draining clear yellow urine.  Eating well. Review of Systems:   CONSTITUTIONAL: No documented fever. No fatigue, weakness. No weight gain, no weight loss.  EYES: No blurry or double vision.  ENT: No tinnitus. No postnasal drip. No redness of the oropharynx.  RESPIRATORY: No cough, no wheeze, no hemoptysis.  Positive dyspnea.  CARDIOVASCULAR: No chest pain. No orthopnea. No palpitations. No syncope.  Positive edema GASTROINTESTINAL: No nausea, no vomiting or diarrhea. No abdominal pain. No melena or hematochezia.  GENITOURINARY: No dysuria or hematuria.  ENDOCRINE: No polyuria or nocturia. No heat or cold intolerance.  HEMATOLOGY: No anemia. No bruising. No bleeding.  INTEGUMENTARY: No rashes. No lesions.  MUSCULOSKELETAL: No arthritis. No swelling. No gout.  NEUROLOGIC: No numbness, tingling, or ataxia. No seizure-type activity.  PSYCHIATRIC: No anxiety. No insomnia. No ADD.    Vitals:   Vitals:   02/20/18 1941 02/20/18 1959 02/21/18 0423 02/21/18 0730  BP: (!) 118/97  137/70 128/62  Pulse: 86  73 70  Resp: 18  16 18   Temp: 98 F (36.7 C)  98.4 F (36.9 C) 97.6 F (36.4 C)  TempSrc: Oral  Oral Oral  SpO2: 94% 96% 94% 99%  Weight:   121.7 kg   Height:        Wt Readings from Last 3 Encounters:  02/21/18 121.7 kg     Intake/Output Summary (Last 24  hours) at 02/21/2018 1127 Last data filed at 02/21/2018 1018 Gross per 24 hour  Intake 480 ml  Output 7450 ml  Net -6970 ml    Physical Exam:   GENERAL: Accessory muscle usage HEAD, EYES, EARS, NOSE AND THROAT: Atraumatic, normocephalic. Extraocular muscles are intact. Pupils equal and reactive to light. Sclerae anicteric. No conjunctival injection. No oro-pharyngeal erythema.  NECK: Supple. There is no jugular venous distention. No bruits, no lymphadenopathy, no thyromegaly.  HEART: Regular rate and rhythm,. No murmurs, no rubs, no clicks.  LUNGS: Crackles at both bases r, nondistended. Has good bowel sounds. No hepatosplenomegaly appreciated.  EXTREMITIES:  Positive edema in both lower extremity, better than before. NEUROLOGIC: Drowsy SKIN: Moist and warm with no rashes appreciated.  Scrotal swelling significant Psych: Not anxious, depressed LN: No inguinal LN enlargement    Antibiotics   Anti-infectives (From admission, onward)   Start     Dose/Rate Route Frequency Ordered Stop   02/17/18 1000  cefdinir (OMNICEF) capsule 300 mg     300 mg Oral Every 12 hours 02/16/18 1259     02/13/18 1200  cefTRIAXone (ROCEPHIN) 1 g in sodium chloride 0.9 % 100 mL IVPB  Status:  Discontinued     1 g 200 mL/hr over 30 Minutes Intravenous Daily 02/13/18 1125 02/16/18 1259   02/13/18 1200  azithromycin (ZITHROMAX) tablet 500 mg  Status:  Discontinued     500 mg Oral Daily 02/13/18 1125 02/16/18 1259      Medications   Scheduled Meds: . budesonide (PULMICORT) nebulizer solution  0.5 mg Nebulization BID  . cefdinir  300 mg Oral Q12H  . chlorhexidine  15 mL Mouth Rinse BID  . collagenase   Topical Daily  . enoxaparin (LOVENOX) injection  40 mg Subcutaneous Q24H  . gabapentin  800 mg Oral TID  . insulin aspart  0-20 Units Subcutaneous TID WC  . insulin aspart  0-5 Units Subcutaneous QHS  . insulin aspart  10 Units Subcutaneous TID WC  . insulin glargine  38 Units Subcutaneous QHS  .  ipratropium-albuterol  3 mL Nebulization Q6H  . lidocaine  2 patch Transdermal Q24H  . lisinopril  10 mg Oral Daily  . mouth rinse  15 mL Mouth Rinse q12n4p  . mouth rinse  15 mL Mouth Rinse BID  . Melatonin  5 mg Oral QHS  . pantoprazole  40 mg Oral BID  . [START ON 02/22/2018] predniSONE  10 mg Oral Q breakfast  . zolpidem  10 mg Oral QHS   Continuous Infusions: . sodium chloride 500 mL (02/15/18 0047)  . furosemide (LASIX) infusion 8 mg/hr (02/20/18 2214)   PRN Meds:.sodium chloride, acetaminophen **OR** acetaminophen, albuterol, hydrALAZINE, ondansetron **OR** ondansetron (ZOFRAN) IV, oxyCODONE   Data Review:   Micro Results Recent Results (from the past 240 hour(s))  MRSA PCR Screening     Status: None   Collection Time: 02/13/18 12:23 PM  Result Value Ref Range Status   MRSA by PCR NEGATIVE NEGATIVE Final    Comment:        The GeneXpert MRSA Assay (FDA approved for NASAL specimens only), is one component of a comprehensive MRSA colonization surveillance program. It is not intended to diagnose MRSA infection nor to guide or monitor treatment for MRSA infections. Performed at [facility], [address] [place], [place] [address]     Radiology Reports US Renal  Result Date: 02/13/2018 CLINICAL DATA:  Acute kidney insufficiency EXAM: RENAL / URINARY TRACT ULTRASOUND COMPLETE COMPARISON:  None. FINDINGS: Right Kidney: Length: 12.9 cm. There is diffuse increased echotexture of the right kidney. No mass or hydronephrosis visualized. Left Kidney: Length: 14.5 cm. Echogenicity within normal limits. No mass or hydronephrosis visualized. Bladder: Evaluation of bladder is limited as patient has Foley catheter in place. IMPRESSION: Diffuse increased echotexture of the right kidney. This is nonspecific but can be seen in medical renal disease. No hydronephrosis identified bilaterally. Electronically Signed   By: Sherian Rein M.D.   On: 02/13/2018 16:36   Dg Chest  Port 1 View  Result Date: 02/14/2018 CLINICAL DATA:  Atelectasis EXAM: PORTABLE CHEST 1 VIEW COMPARISON:  02/12/2018 FINDINGS: Increased interstitial markings with a perihilar distribution, increased, favoring mild interstitial edema. Small right pleural effusion. Associated right basilar opacity, likely atelectasis, unchanged. No pneumothorax. Cardiomegaly. IMPRESSION: Cardiomegaly with mild interstitial edema, increased. Small right pleural effusion. Associated right lower lobe opacity, likely atelectasis, unchanged. Electronically Signed   By: Charline BillsSriyesh  Krishnan M.D.   On: 02/14/2018 07:16   Dg Chest Port 1 View  Result Date: 02/12/2018 CLINICAL DATA:  Acute onset of shortness of breath. Expiratory wheezing. EXAM: PORTABLE CHEST 1 VIEW COMPARISON:  Chest radiograph performed 10/23/2014, and CTA of the chest performed 10/24/2014 FINDINGS: A small right pleural effusion is noted, with associated opacity. Increased interstitial markings may reflect mild interstitial edema. Alternatively, right basilar pneumonia could have a similar appearance. No pneumothorax is seen. The cardiomediastinal silhouette is borderline normal in size. No acute osseous abnormalities are identified. IMPRESSION: Small right pleural effusion noted. Increased interstitial markings may reflect mild interstitial edema. Alternatively, right basilar pneumonia could have a similar appearance. Electronically Signed   By: Roanna RaiderJeffery  Chang M.D.   On: 02/12/2018 02:36     CBC No results for input(s): WBC, HGB, HCT, PLT, MCV, MCH, MCHC, RDW, LYMPHSABS, MONOABS, EOSABS, BASOSABS, BANDABS in the last 168 hours.  Invalid input(s): NEUTRABS, BANDSABD  Chemistries  Recent Labs  Lab 02/15/18 0336  02/17/18 0521 02/18/18 0450 02/19/18 0445 02/20/18 0453 02/21/18 0447  NA 136   < > 139 138 139 138 140  K 5.5*   < > 4.8 4.7 4.3 4.1 4.2  CL 105   < > 102 99 99 97* 99  CO2 24   < > 28 31 34* 34* 35*  GLUCOSE 504*   < > 410* 376* 304* 430*  233*  BUN 51*   < > 55* 55* 54* 48* 56*  CREATININE 1.47*   < > 1.54* 1.44* 1.29* 1.42* 1.38*  CALCIUM 8.4*   < > 8.4* 8.4* 8.2* 8.1* 8.1*  MG  --   --   --  2.0  --   --   --   AST 17  --   --   --   --   --   --   ALT 26  --   --   --   --   --   --   ALKPHOS 162*  --   --   --   --   --   --   BILITOT 0.5  --   --   --   --   --   --    < > = values in this interval not displayed.   ------------------------------------------------------------------------------------------------------------------ estimated creatinine clearance is 89.8 mL/min (A) (by C-G formula based on SCr of 1.38 mg/dL (H)). ------------------------------------------------------------------------------------------------------------------ No results for input(s): HGBA1C in the last 72 hours. ------------------------------------------------------------------------------------------------------------------ No results for input(s): CHOL, HDL, LDLCALC, TRIG, CHOLHDL, LDLDIRECT in the last 72 hours. ------------------------------------------------------------------------------------------------------------------ No results for input(s): TSH, T4TOTAL, T3FREE, THYROIDAB in the last 72 hours.  Invalid input(s): FREET3 ------------------------------------------------------------------------------------------------------------------ No results for input(s): VITAMINB12, FOLATE, FERRITIN, TIBC, IRON, RETICCTPCT in the last 72 hours.  Coagulation profile No results for input(s): INR, PROTIME in the last 168 hours.  No results for input(s): DDIMER in the last 72 hours.  Cardiac Enzymes No results for input(s): CKMB, TROPONINI, MYOGLOBIN in the last 168 hours.  Invalid input(s): CK ------------------------------------------------------------------------------------------------------------------ Invalid input(s): POCBNP    Assessment & Plan  Patient is 47 year old noncompliant with his CHF regimen presenting with worsening  shortness of breath  and swelling   1.  Acute hypoxic respiratory failure severe fluid overload Continue continue therapy with IV Lasix drip Hypoxia improved, patient saturation 95% on room air.   likely discharge tomorrow with p.o. Lasix.  Physical therapy recommended home health physical therapy.  2.Acute on chronic diastolic CHF (congestive heart failure) (HCC) - Continue IV Lasix drip   recommended patient adhere to his diet and fluid intake, discharge weight goal is around 105 kg. Echocardiogram shows no significant changes Watch for hypokalemia, hypomagnesemia while on diuretics. 3.    Accelerated hypertension pressure stable Continue lisinopril add IV hydralazine as needed blood pressure stable  4  COPD with possible exacerbation no wheezing now.  Continue nebulizer therapy. predinosone taper, Omnicef.  Discontinue after today dose to patient received 5 days of Omnicef.  5  Diabetes (HCC) -sliding scale insulin with corresponding glucose checks  Uncontrolled now, blood glucose more than 200s.  Annual Lantus, sliding scale insulin with coverage.  Had history of labile blood glucose.  6  Leg wounds and diabetic foot wound -blistering leg wounds due to his edema, treatment for edema as above,    7.  GERD continue Protonix  moreThan 50% time spent in counseling, coordination of care    Code Status Orders  (From admission, onward)         Start     Ordered   02/12/18 0339  Full code  Continuous     02/12/18 0338        Code Status History    This patient has a current code status but no historical code status.           Consults cardiology  DVT Prophylaxis  Lovenox  Lab Results  Component Value Date   PLT 332 02/12/2018     Time Spent in minutes   35 minutes spent Katha Hamming M.D on 02/21/2018 at 11:27 AM  Between 7am to 6pm - Pager - [phone]  After 6pm go to [email or web address] - Social research officer, government  [employer]   Office   [phone]

## 2018-02-21 NOTE — Progress Notes (Signed)
[facility] [place], [place] 02/21/18  Subjective:   Doing fair Appetite is good Good amount of diuresis with lasix infusion. Another 7.4 Liters Creatinine stable, potassium normal  Objective:  Vital signs in last 24 hours:  Temp:  [97.6 F (36.4 C)-98.4 F (36.9 C)] 97.6 F (36.4 C) (08/18 0730) Pulse Rate:  [70-86] 70 (08/18 0730) Resp:  [16-20] 18 (08/18 0730) BP: (118-137)/(62-97) 128/62 (08/18 0730) SpO2:  [94 %-99 %] 99 % (08/18 0730) Weight:  [121.7 kg] 121.7 kg (08/18 0423)  Weight change: -3.13 kg Filed Weights   02/19/18 0500 02/20/18 0435 02/21/18 0423  Weight: 128.7 kg 124.8 kg 121.7 kg    Intake/Output:    Intake/Output Summary (Last 24 hours) at 02/21/2018 1127 Last data filed at 02/21/2018 1018 Gross per 24 hour  Intake 480 ml  Output 7450 ml  Net -6970 ml    Physical Exam: General:  No acute distress, laying in the bed  HEENT  anicteric, moist oral mucous membranes  Neck:  Supple  Lungs:  Mild bibasilar crackles  Heart::  No rub, regular rhythm  Abdomen:  Soft, nontender  Extremities:  2+ severe pitting edema, left big toe amputation, right toe amputation  Neurologic:   Resting quietly small muscle wasting in hands b/l  Skin:  Congestive hyperemia over leg  Foley:  In place      Basic Metabolic Panel:  Recent Labs  Lab 02/17/18 0521 02/18/18 0450 02/19/18 0445 02/20/18 0453 02/21/18 0447  NA 139 138 139 138 140  K 4.8 4.7 4.3 4.1 4.2  CL 102 99 99 97* 99  CO2 28 31 34* 34* 35*  GLUCOSE 410* 376* 304* 430* 233*  BUN 55* 55* 54* 48* 56*  CREATININE 1.54* 1.44* 1.29* 1.42* 1.38*  CALCIUM 8.4* 8.4* 8.2* 8.1* 8.1*  MG  --  2.0  --   --   --      CBC: No results for input(s): WBC, NEUTROABS, HGB, HCT, MCV, PLT in the last 168 hours.   No results found for: HEPBSAG, HEPBSAB, HEPBIGM    Microbiology:  Recent Results (from the past 240 hour(s))  MRSA PCR Screening     Status: None   Collection Time:  02/13/18 12:23 PM  Result Value Ref Range Status   MRSA by PCR NEGATIVE NEGATIVE Final    Comment:        The GeneXpert MRSA Assay (FDA approved for NASAL specimens only), is one component of a comprehensive MRSA colonization surveillance program. It is not intended to diagnose MRSA infection nor to guide or monitor treatment for MRSA infections. Performed at [facility], [address] [place], [place] [address]     Coagulation Studies: No results for input(s): LABPROT, INR in the last 72 hours.  Urinalysis: No results for input(s): COLORURINE, LABSPEC, PHURINE, GLUCOSEU, HGBUR, BILIRUBINUR, KETONESUR, PROTEINUR, UROBILINOGEN, NITRITE, LEUKOCYTESUR in the last 72 hours.  Invalid input(s): APPERANCEUR    Imaging: No results found.   Medications:   . sodium chloride 500 mL (02/15/18 0047)  . furosemide (LASIX) infusion 8 mg/hr (02/20/18 2214)   . budesonide (PULMICORT) nebulizer solution  0.5 mg Nebulization BID  . cefdinir  300 mg Oral Q12H  . chlorhexidine  15 mL Mouth Rinse BID  . collagenase   Topical Daily  . enoxaparin (LOVENOX) injection  40 mg Subcutaneous Q24H  . gabapentin  800 mg Oral TID  . insulin aspart  0-20 Units Subcutaneous TID WC  . insulin aspart  0-5  Units Subcutaneous QHS  . insulin aspart  10 Units Subcutaneous TID WC  . insulin glargine  38 Units Subcutaneous QHS  . ipratropium-albuterol  3 mL Nebulization Q6H  . lidocaine  2 patch Transdermal Q24H  . lisinopril  10 mg Oral Daily  . mouth rinse  15 mL Mouth Rinse q12n4p  . mouth rinse  15 mL Mouth Rinse BID  . Melatonin  5 mg Oral QHS  . pantoprazole  40 mg Oral BID  . [START ON 02/22/2018] predniSONE  10 mg Oral Q breakfast  . zolpidem  10 mg Oral QHS   sodium chloride, acetaminophen **OR** acetaminophen, albuterol, hydrALAZINE, ondansetron **OR** ondansetron (ZOFRAN) IV, oxyCODONE  Assessment/ Plan:  47 y.o. male  with HTN, dCHF, Diabetes with complications (neuropathy),  history of positive ANA April 2019,  was admitted on 02/12/2018 with massive volume overload  1.  Acute kidney injury.  Baseline creatinine 1.13 from May/27/2019/GFR > 60 2.  Massive volume overload 3.  Hypoalbuminemia 4.  Diabetes with neuropathy, nephropathy, proteinuria 5.  Hyperkalemia  Plan: Agree with continuing IV Lasix infusion, at present he has good response 2 D Echo = EF 50-55% Potassium is corrected but could go low with diuresis. Monitor closely with Mg Standing weights when possible. Currently 121 kg. Goal 110 kg Consider switching to oral torsemide 40 mg twice a day on Monday with eventual goal of maintenance dose of 40 mg daily.  May need voiding trial after removal of Foley prior to discharge.   LOS: 8 John Lawson 8/18/201911:27 AM  [facility] [place], [place] [phone]  Note: This note was prepared with Dragon dictation. Any transcription errors are unintentional

## 2018-02-21 NOTE — Plan of Care (Signed)
  Problem: Health Behavior/Discharge Planning: Goal: Ability to manage health-related needs will improve Outcome: Progressing Note:  Patient agreeable to R foot wound dressing change this afternoon to promote timely healing. However, did refuse the re-application of ACE wraps to the area. Will continue to monitor wound dressing status. Jari FavreSteven M [facility]

## 2018-02-22 ENCOUNTER — Ambulatory Visit: Payer: Disability Insurance | Admitting: Family

## 2018-02-22 LAB — BASIC METABOLIC PANEL
ANION GAP: 7 (ref 5–15)
BUN: 44 mg/dL — ABNORMAL HIGH (ref 6–20)
CALCIUM: 8.3 mg/dL — AB (ref 8.9–10.3)
CO2: 37 mmol/L — ABNORMAL HIGH (ref 22–32)
Chloride: 97 mmol/L — ABNORMAL LOW (ref 98–111)
Creatinine, Ser: 1.3 mg/dL — ABNORMAL HIGH (ref 0.61–1.24)
GFR calc non Af Amer: >60 mL/min (ref >60)
GLUCOSE: 266 mg/dL — AB (ref 70–99)
POTASSIUM: 4.1 mmol/L (ref 3.5–5.1)
Sodium: 141 mmol/L (ref 135–145)

## 2018-02-22 LAB — C DIFFICILE QUICK SCREEN W PCR REFLEX
C DIFFICILE (CDIFF) INTERP: NOT DETECTED
C DIFFICILE (CDIFF) TOXIN: NEGATIVE
C Diff antigen: NEGATIVE

## 2018-02-22 LAB — GLUCOSE, CAPILLARY
GLUCOSE-CAPILLARY: 194 mg/dL — AB (ref 70–99)
GLUCOSE-CAPILLARY: 68 mg/dL — AB (ref 70–99)
GLUCOSE-CAPILLARY: 101 mg/dL — AB (ref 70–99)
GLUCOSE-CAPILLARY: 178 mg/dL — AB (ref 70–99)
Glucose-Capillary: 173 mg/dL — ABNORMAL HIGH (ref 70–99)

## 2018-02-22 MED ORDER — INSULIN GLARGINE 100 UNIT/ML ~~LOC~~ SOLN
35.0000 [IU] | Freq: Every day | SUBCUTANEOUS | Status: DC
  Administered 2018-02-22: 35 [IU] via SUBCUTANEOUS
  Filled 2018-02-22 (×3): qty 0.35

## 2018-02-22 MED ORDER — INSULIN ASPART 100 UNIT/ML ~~LOC~~ SOLN
10.0000 [IU] | Freq: Three times a day (TID) | SUBCUTANEOUS | Status: DC
  Administered 2018-02-22 – 2018-02-24 (×4): 10 [IU] via SUBCUTANEOUS
  Filled 2018-02-22 (×4): qty 1

## 2018-02-22 MED ORDER — TORSEMIDE 20 MG PO TABS
40.0000 mg | ORAL_TABLET | Freq: Two times a day (BID) | ORAL | Status: DC
  Administered 2018-02-22 – 2018-02-23 (×2): 40 mg via ORAL
  Filled 2018-02-22 (×2): qty 2

## 2018-02-22 MED ORDER — IPRATROPIUM-ALBUTEROL 0.5-2.5 (3) MG/3ML IN SOLN
3.0000 mL | Freq: Two times a day (BID) | RESPIRATORY_TRACT | Status: DC
  Administered 2018-02-22 – 2018-02-27 (×10): 3 mL via RESPIRATORY_TRACT
  Filled 2018-02-22 (×10): qty 3

## 2018-02-22 NOTE — Care Management (Deleted)
Patient suffers from heart failure and shortness of breath which impairs their ability to perform daily activities like toileting, feeding, dressing, grooming, bathing) in the home.  A cane, walker, crutch will not resolve issue with performing activities of daily living.  A wheelchair will allow patient to safely perform daily activities.      Patient can safely propel the wheelchair in the home or has a caregiver who can provide assistance.

## 2018-02-22 NOTE — Progress Notes (Signed)
Nutrition Brief Note  Patient identified for LOS  47 y.o. male with HTN, dCHF, Diabetes with complications (neuropathy),history of positive ANA April 2019,was admitted on 8/9/2019with massive volume overload  Wt Readings from Last 15 Encounters:  02/22/18 120.6 kg    Body mass index is 34.14 kg/m. Patient meets criteria for obesity based on current BMI.   Current diet order is HH/CHO modified, patient is consuming approximately 100% of meals at this time. Labs and medications reviewed. Pt previously received diabetes and CHF education on 8/12. Pt continues to eat 100% of meals. Pt has lost 49lbs since admit and is ~6-7lbs up from his UBW.   No nutrition interventions warranted at this time. If nutrition issues arise, please consult RD.   John Holidayasey Cydnie Deason MS, RD, LDN Pager #- [phone] Office#- [phone] After Hours Pager: [phone]

## 2018-02-22 NOTE — Progress Notes (Signed)
Removed foley catheter had 300 cc out. RN will continue to monitor.

## 2018-02-22 NOTE — Progress Notes (Signed)
[employer] - [place] [facility]                                                                                                                                                                                  Patient Demographics   John RiggsMichael Lawson, is a 47 y.o. male, DOB - 03/11/1971, ZOX:[id number]  Admit date - 02/12/2018   Admitting Physician Oralia Manisavid Willis, MD  Outpatient Primary [facility] the [facility]   LOS - 9  Subjective:  patient denies any shortness of breath and wants to go home but still on Lasix drip and nephrologist has not stopped that yet.  He denies any shortness of breath or chest pain. Review of Systems:   CONSTITUTIONAL: No documented fever. No fatigue, weakness. No weight gain, no weight loss.  EYES: No blurry or double vision.  ENT: No tinnitus. No postnasal drip. No redness of the oropharynx.  RESPIRATORY: No cough, no wheeze, no hemoptysis.  Positive dyspnea.  CARDIOVASCULAR: No chest pain. No orthopnea. No palpitations. No syncope.  Positive edema GASTROINTESTINAL: No nausea, no vomiting or diarrhea. No abdominal pain. No melena or hematochezia.  GENITOURINARY: No dysuria or hematuria.  ENDOCRINE: No polyuria or nocturia. No heat or cold intolerance.  HEMATOLOGY: No anemia. No bruising. No bleeding.  INTEGUMENTARY: No rashes. No lesions.  MUSCULOSKELETAL: No arthritis. No swelling. No gout.  NEUROLOGIC: No numbness, tingling, or ataxia. No seizure-type activity.  PSYCHIATRIC: No anxiety. No insomnia. No ADD.    Vitals:   Vitals:   02/21/18 2050 02/22/18 0406 02/22/18 0748 02/22/18 0758  BP: (!) 157/74 (!) 157/71  (!) 142/63  Pulse: 61 84  78  Resp: 18 18  18   Temp: 98 F (36.7 C) 97.8 F (36.6 C)    TempSrc: Oral Oral    SpO2: 95% 98% 98% 100%  Weight:  120.6 kg    Height:        Wt Readings from Last 3 Encounters:  02/22/18 120.6 kg     Intake/Output Summary (Last 24 hours) at 02/22/2018  1234 Last data filed at 02/22/2018 0846 Gross per 24 hour  Intake 240 ml  Output 5000 ml  Net -4760 ml    Physical Exam:   GENERAL: Accessory muscle usage HEAD, EYES, EARS, NOSE AND THROAT: Atraumatic, normocephalic. Extraocular muscles are intact. Pupils equal and reactive to light. Sclerae anicteric. No conjunctival injection. No oro-pharyngeal erythema.  NECK: Supple. There is no jugular venous distention. No bruits, no lymphadenopathy, no thyromegaly.  HEART: Regular rate and rhythm,. No murmurs, no rubs, no clicks.  LUNGS: Clear to auscultation. Has good bowel sounds. No hepatosplenomegaly appreciated.  EXTREMITIES:  Positive edema in both lower extremity, better than before. NEUROLOGIC: Drowsy SKIN: Moist and warm with no rashes appreciated.  Scrotal swelling significant Psych: Not anxious, depressed LN: No inguinal LN enlargement    Antibiotics   Anti-infectives (From admission, onward)   Start     Dose/Rate Route Frequency Ordered Stop   02/17/18 1000  cefdinir (OMNICEF) capsule 300 mg     300 mg Oral Every 12 hours 02/16/18 1259     02/13/18 1200  cefTRIAXone (ROCEPHIN) 1 g in sodium chloride 0.9 % 100 mL IVPB  Status:  Discontinued     1 g 200 mL/hr over 30 Minutes Intravenous Daily 02/13/18 1125 02/16/18 1259   02/13/18 1200  azithromycin (ZITHROMAX) tablet 500 mg  Status:  Discontinued     500 mg Oral Daily 02/13/18 1125 02/16/18 1259      Medications   Scheduled Meds: . budesonide (PULMICORT) nebulizer solution  0.5 mg Nebulization BID  . cefdinir  300 mg Oral Q12H  . chlorhexidine  15 mL Mouth Rinse BID  . collagenase   Topical Daily  . enoxaparin (LOVENOX) injection  40 mg Subcutaneous Q24H  . gabapentin  800 mg Oral TID  . insulin aspart  0-20 Units Subcutaneous TID WC  . insulin aspart  0-5 Units Subcutaneous QHS  . insulin aspart  13 Units Subcutaneous TID WC  . insulin glargine  40 Units Subcutaneous QHS  . ipratropium-albuterol  3 mL Nebulization Q6H   . lidocaine  2 patch Transdermal Q24H  . lisinopril  10 mg Oral Daily  . mouth rinse  15 mL Mouth Rinse q12n4p  . mouth rinse  15 mL Mouth Rinse BID  . Melatonin  5 mg Oral QHS  . pantoprazole  40 mg Oral BID  . torsemide  40 mg Oral BID  . zolpidem  10 mg Oral QHS   Continuous Infusions: . sodium chloride 500 mL (02/15/18 0047)   PRN Meds:.sodium chloride, acetaminophen **OR** acetaminophen, albuterol, hydrALAZINE, ondansetron **OR** ondansetron (ZOFRAN) IV, oxyCODONE   Data Review:   Micro Results Recent Results (from the past 240 hour(s))  MRSA PCR Screening     Status: None   Collection Time: 02/13/18 12:23 PM  Result Value Ref Range Status   MRSA by PCR NEGATIVE NEGATIVE Final    Comment:        The GeneXpert MRSA Assay (FDA approved for NASAL specimens only), is one component of a comprehensive MRSA colonization surveillance program. It is not intended to diagnose MRSA infection nor to guide or monitor treatment for MRSA infections. Performed at [facility], [address] [place], [place] [address]   C difficile quick scan w PCR reflex     Status: None   Collection Time: 02/22/18  3:33 AM  Result Value Ref Range Status   C Diff antigen NEGATIVE NEGATIVE Final   C Diff toxin NEGATIVE NEGATIVE Final   C Diff interpretation No C. difficile detected.  Final    Comment: Performed at [facility], [address], [place], [place] [address]    Radiology Reports US Renal  Result Date: 02/13/2018 CLINICAL DATA:  Acute kidney insufficiency EXAM: RENAL / URINARY TRACT ULTRASOUND COMPLETE COMPARISON:  None. FINDINGS: Right Kidney: Length: 12.9 cm. There is diffuse increased echotexture of the right kidney. No mass or hydronephrosis visualized. Left Kidney: Length: 14.5 cm. Echogenicity within normal limits. No mass or hydronephrosis visualized. Bladder: Evaluation of bladder is limited as patient has Foley catheter in place. IMPRESSION: Diffuse  increased echotexture of the right kidney. This is nonspecific but can be seen in medical renal disease. No hydronephrosis identified bilaterally. Electronically Signed   By: Sherian Rein M.D.   On: 02/13/2018 16:36   Dg Chest Port 1 View  Result Date: 02/14/2018 CLINICAL DATA:  Atelectasis EXAM: PORTABLE CHEST 1 VIEW COMPARISON:  02/12/2018 FINDINGS: Increased interstitial markings with a perihilar distribution, increased, favoring mild interstitial edema. Small right pleural effusion. Associated right basilar opacity, likely atelectasis, unchanged. No pneumothorax. Cardiomegaly. IMPRESSION: Cardiomegaly with mild interstitial edema, increased. Small right pleural effusion. Associated right lower lobe opacity, likely atelectasis, unchanged. Electronically Signed   By: Charline Bills M.D.   On: 02/14/2018 07:16   Dg Chest Port 1 View  Result Date: 02/12/2018 CLINICAL DATA:  Acute onset of shortness of breath. Expiratory wheezing. EXAM: PORTABLE CHEST 1 VIEW COMPARISON:  Chest radiograph performed 10/23/2014, and CTA of the chest performed 10/24/2014 FINDINGS: A small right pleural effusion is noted, with associated opacity. Increased interstitial markings may reflect mild interstitial edema. Alternatively, right basilar pneumonia could have a similar appearance. No pneumothorax is seen. The cardiomediastinal silhouette is borderline normal in size. No acute osseous abnormalities are identified. IMPRESSION: Small right pleural effusion noted. Increased interstitial markings may reflect mild interstitial edema. Alternatively, right basilar pneumonia could have a similar appearance. Electronically Signed   By: Roanna Raider M.D.   On: 02/12/2018 02:36     CBC No results for input(s): WBC, HGB, HCT, PLT, MCV, MCH, MCHC, RDW, LYMPHSABS, MONOABS, EOSABS, BASOSABS, BANDABS in the last 168 hours.  Invalid input(s): NEUTRABS, BANDSABD  Chemistries  Recent Labs  Lab 02/18/18 0450 02/19/18 0445  02/20/18 0453 02/21/18 0447 02/22/18 0446  NA 138 139 138 140 141  K 4.7 4.3 4.1 4.2 4.1  CL 99 99 97* 99 97*  CO2 31 34* 34* 35* 37*  GLUCOSE 376* 304* 430* 233* 266*  BUN 55* 54* 48* 56* 44*  CREATININE 1.44* 1.29* 1.42* 1.38* 1.30*  CALCIUM 8.4* 8.2* 8.1* 8.1* 8.3*  MG 2.0  --   --   --   --    ------------------------------------------------------------------------------------------------------------------ estimated creatinine clearance is 94.9 mL/min (A) (by C-G formula based on SCr of 1.3 mg/dL (H)). ------------------------------------------------------------------------------------------------------------------ No results for input(s): HGBA1C in the last 72 hours. ------------------------------------------------------------------------------------------------------------------ No results for input(s): CHOL, HDL, LDLCALC, TRIG, CHOLHDL, LDLDIRECT in the last 72 hours. ------------------------------------------------------------------------------------------------------------------ No results for input(s): TSH, T4TOTAL, T3FREE, THYROIDAB in the last 72 hours.  Invalid input(s): FREET3 ------------------------------------------------------------------------------------------------------------------ No results for input(s): VITAMINB12, FOLATE, FERRITIN, TIBC, IRON, RETICCTPCT in the last 72 hours.  Coagulation profile No results for input(s): INR, PROTIME in the last 168 hours.  No results for input(s): DDIMER in the last 72 hours.  Cardiac Enzymes No results for input(s): CKMB, TROPONINI, MYOGLOBIN in the last 168 hours.  Invalid input(s): CK ------------------------------------------------------------------------------------------------------------------ Invalid input(s): POCBNP    Assessment & Plan  Patient is 47 year old noncompliant with his CHF regimen presenting with worsening shortness of breath and swelling   1.  Acute hypoxic respiratory failure severe fluid  overload, improved.  Patient is on room air now.  2.Acute on chronic diastolic CHF (congestive heart failure) (HCC) - Improving,  re nephrologist recommended to start torsemide 40 mg p.o. twice daily and discontinue IV Lasix drip.  Discontinue Foley catheter and start voiding trial, likely discharge home tomorrow. Echocardiogram shows no significant changes Watch for hypokalemia, hypomagnesemia while on diuretics. 3.    Accelerated hypertension pressure stable Continue lisinopril add IV hydralazine  as needed blood pressure stable  4  COPD with possible exacerbation no wheezing now.  Continue nebulizer therapy. predinosone taper, Omnicef.  Discontinue after today dose to patient received 5 days of Omnicef.  5  Diabetes (HCC) -continue sliding scale insulin with coverage, Lantus.   6  Leg wounds and diabetic foot wound -blistering leg wounds due to his edema, treatment for edema as above,    7.  GERD continue Protonix  moreThan 50% time spent in counseling, coordination of care    Code Status Orders  (From admission, onward)         Start     Ordered   02/12/18 0339  Full code  Continuous     02/12/18 0338        Code Status History    This patient has a current code status but no historical code status.           Consults cardiology  DVT Prophylaxis  Lovenox  Lab Results  Component Value Date   PLT 332 02/12/2018     Time Spent in minutes   35 minutes spent Katha HammingSnehalatha Jarone Ostergaard M.D on 02/22/2018 at 12:34 PM  Between 7am to 6pm - Pager - [phone]  After 6pm go to [email or web address] - Social research officer, governmentpassword [email or web address]  [employer]   Office  [phone]

## 2018-02-22 NOTE — Progress Notes (Signed)
Recheck cbg 141 mg/dl

## 2018-02-22 NOTE — Care Management Note (Addendum)
Case Management Note  Patient Details  Name: John Lawson MRN: [id number] Date of Birth: [birth year]  Subjective/Objective:                  RNCM met with patient to discuss transition of care. He currently lives with sister John Lawson on [address] but plans to go to ex-wife's address (not available) John Lawson in [place]. He has used [facility] home health in the past and would agree. RNCM offered [employer] vest opportunity to monitor and treat heart failure based on [facility] at home protocol.  He is considering. He has front wheeled walker for ambulation. He is awaiting driver's license renewal via PCP/[place] DMV.He states that his [name] will provide transportation to home and appointments. He may benefit from Wheelchair due to shortness of breath. However he ambulated over 100 feet and does not qualify for wheelchair. PCP with [facility]. Medications without problem via mail order. Patient is not currently requiring supplemental O2.    Action/Plan:  Home health agency list provided.   Expected Discharge Date:                  Expected Discharge Plan:  [place]  In-House Referral:     Discharge planning Services  HF Clinic, CM Consult  Post Acute Care Choice:  Home Health Choice offered to:  Patient  DME Arranged:    DME Agency:     HH Arranged:    [place] Agency:     Status of Service:  In process, will continue to follow  If discussed at Long Length of Stay Meetings, dates discussed:    Additional Comments:  Marshell Garfinkel, RN 02/22/2018, 4:31 PM

## 2018-02-22 NOTE — Progress Notes (Signed)
[facility] [place], [place] 02/22/18  Subjective:  Patient seen at bedside. Continues to do well with Lasix drip. Complains of back pain today.   Objective:  Vital signs in last 24 hours:  Temp:  [97.8 F (36.6 C)-98 F (36.7 C)] 97.8 F (36.6 C) (08/19 0406) Pulse Rate:  [61-84] 78 (08/19 0758) Resp:  [18] 18 (08/19 0758) BP: (142-157)/(63-74) 142/63 (08/19 0758) SpO2:  [95 %-100 %] 100 % (08/19 0758) Weight:  [120.6 kg] 120.6 kg (08/19 0406)  Weight change: -1.1 kg Filed Weights   02/20/18 0435 02/21/18 0423 02/22/18 0406  Weight: 124.8 kg 121.7 kg 120.6 kg    Intake/Output:    Intake/Output Summary (Last 24 hours) at 02/22/2018 1521 Last data filed at 02/22/2018 0846 Gross per 24 hour  Intake -  Output 5000 ml  Net -5000 ml    Physical Exam: General:  No acute distress, laying in the bed  HEENT  anicteric, moist oral mucous membranes  Neck:  Supple  Lungs:  Mild bibasilar crackles  Heart::  No rub, regular rhythm  Abdomen:  Soft, nontender  Extremities:  2+ severe pitting edema, left big toe amputation, right toe amputation  Neurologic:  Resting quietly small muscle wasting in hands b/l  Skin:  Congestive hyperemia over leg  Foley:  In place      Basic Metabolic Panel:  Recent Labs  Lab 02/18/18 0450 02/19/18 0445 02/20/18 0453 02/21/18 0447 02/22/18 0446  NA 138 139 138 140 141  K 4.7 4.3 4.1 4.2 4.1  CL 99 99 97* 99 97*  CO2 31 34* 34* 35* 37*  GLUCOSE 376* 304* 430* 233* 266*  BUN 55* 54* 48* 56* 44*  CREATININE 1.44* 1.29* 1.42* 1.38* 1.30*  CALCIUM 8.4* 8.2* 8.1* 8.1* 8.3*  MG 2.0  --   --   --   --      CBC: No results for input(s): WBC, NEUTROABS, HGB, HCT, MCV, PLT in the last 168 hours.   No results found for: HEPBSAG, HEPBSAB, HEPBIGM    Microbiology:  Recent Results (from the past 240 hour(s))  MRSA PCR Screening     Status: None   Collection Time: 02/13/18 12:23 PM  Result Value Ref Range Status   MRSA by PCR NEGATIVE NEGATIVE Final    Comment:        The GeneXpert MRSA Assay (FDA approved for NASAL specimens only), is one component of a comprehensive MRSA colonization surveillance program. It is not intended to diagnose MRSA infection nor to guide or monitor treatment for MRSA infections. Performed at [facility], [address] [place], [place] [address]   C difficile quick scan w PCR reflex     Status: None   Collection Time: 02/22/18  3:33 AM  Result Value Ref Range Status   C Diff antigen NEGATIVE NEGATIVE Final   C Diff toxin NEGATIVE NEGATIVE Final   C Diff interpretation No C. difficile detected.  Final    Comment: Performed at [facility], [address] [place], [place] [address]    Coagulation Studies: No results for input(s): LABPROT, INR in the last 72 hours.  Urinalysis: No results for input(s): COLORURINE, LABSPEC, PHURINE, GLUCOSEU, HGBUR, BILIRUBINUR, KETONESUR, PROTEINUR, UROBILINOGEN, NITRITE, LEUKOCYTESUR in the last 72 hours.  Invalid input(s): APPERANCEUR    Imaging: No results found.   Medications:   . sodium chloride 500 mL (02/15/18 0047)   . budesonide (PULMICORT) nebulizer solution  0.5 mg Nebulization BID  .  chlorhexidine  15 mL Mouth Rinse BID  . collagenase   Topical Daily  . enoxaparin (LOVENOX) injection  40 mg Subcutaneous Q24H  . gabapentin  800 mg Oral TID  . insulin aspart  0-20 Units Subcutaneous TID WC  . insulin aspart  0-5 Units Subcutaneous QHS  . insulin aspart  10 Units Subcutaneous TID WC  . insulin glargine  35 Units Subcutaneous QHS  . ipratropium-albuterol  3 mL Nebulization BID  . lidocaine  2 patch Transdermal Q24H  . lisinopril  10 mg Oral Daily  . mouth rinse  15 mL Mouth Rinse q12n4p  . mouth rinse  15 mL Mouth Rinse BID  . Melatonin  5 mg Oral QHS  . pantoprazole  40 mg Oral BID  . torsemide  40 mg Oral BID  . zolpidem  10 mg Oral QHS   sodium chloride, acetaminophen **OR**  acetaminophen, albuterol, hydrALAZINE, ondansetron **OR** ondansetron (ZOFRAN) IV, oxyCODONE  Assessment/ Plan:  47 y.o. male  with HTN, dCHF, Diabetes with complications (neuropathy), history of positive ANA April 2019,  was admitted on 02/12/2018 with massive volume overload  1.  Acute kidney injury.  Baseline creatinine 1.13 from May/27/2019/GFR > 60 2.  Generalized edmea 3.  Hypoalbuminemia 4.  Diabetes mellitus type with CKD.  5.  Hyperkalemia  Plan: She had good urine output of 5.1 L yesterday.  Still has considerable volume overload however.  Was treated with Lasix drip.  Now transition to torsemide 40 mg p.o. twice daily.  Continue to monitor overall edema status.   LOS: 9 John Lawson 8/19/20193:21 PM  [facility] [facility] [place], [place] [phone]  Note: This note was prepared with Dragon dictation. Any transcription errors are unintentional

## 2018-02-22 NOTE — Progress Notes (Deleted)
   Patient ID: John Lawson, male    DOB: 01/05/1971, 47 y.o.   MRN: [id number]  HPI  John Lawson is a 47 y/o male with a history of  Echo report from 02/12/18 reviewed and showed an EF of 50-55%.  Review of Systems    Physical Exam

## 2018-02-23 LAB — GLUCOSE, CAPILLARY
GLUCOSE-CAPILLARY: 271 mg/dL — AB (ref 70–99)
GLUCOSE-CAPILLARY: 86 mg/dL (ref 70–99)
Glucose-Capillary: 312 mg/dL — ABNORMAL HIGH (ref 70–99)
Glucose-Capillary: 84 mg/dL (ref 70–99)
Glucose-Capillary: 131 mg/dL — ABNORMAL HIGH (ref 70–99)

## 2018-02-23 LAB — BASIC METABOLIC PANEL
Anion gap: 7 (ref 5–15)
BUN: 46 mg/dL — AB (ref 6–20)
CO2: 33 mmol/L — ABNORMAL HIGH (ref 22–32)
CREATININE: 1.64 mg/dL — AB (ref 0.61–1.24)
Calcium: 7.7 mg/dL — ABNORMAL LOW (ref 8.9–10.3)
Chloride: 100 mmol/L (ref 98–111)
GFR calc non Af Amer: 48 mL/min — ABNORMAL LOW (ref >60)
GFR, EST AFRICAN AMERICAN: 55 mL/min — AB (ref >60)
Glucose, Bld: 262 mg/dL — ABNORMAL HIGH (ref 70–99)
POTASSIUM: 4.7 mmol/L (ref 3.5–5.1)
SODIUM: 140 mmol/L (ref 135–145)

## 2018-02-23 MED ORDER — FUROSEMIDE 10 MG/ML IJ SOLN
6.0000 mg/h | INTRAVENOUS | Status: DC
  Administered 2018-02-23 – 2018-02-25 (×2): 6 mg/h via INTRAVENOUS
  Filled 2018-02-23 (×2): qty 25

## 2018-02-23 MED ORDER — INSULIN ASPART 100 UNIT/ML ~~LOC~~ SOLN: 0.0000 [IU] | Freq: Three times a day (TID) | SUBCUTANEOUS | 11 refills | Status: DC

## 2018-02-23 MED ORDER — INSULIN DETEMIR 100 UNIT/ML ~~LOC~~ SOLN: 33.0000 [IU] | Freq: Every day | SUBCUTANEOUS | 11 refills | Status: DC

## 2018-02-23 MED ORDER — METOPROLOL TARTRATE 25 MG PO TABS: 25.0000 mg | ORAL_TABLET | Freq: Two times a day (BID) | ORAL | 0 refills | Status: DC

## 2018-02-23 MED ORDER — LISINOPRIL 10 MG PO TABS: 10.0000 mg | ORAL_TABLET | Freq: Every day | ORAL | 0 refills | Status: DC

## 2018-02-23 MED ORDER — TORSEMIDE 20 MG PO TABS: 40.0000 mg | ORAL_TABLET | Freq: Two times a day (BID) | ORAL | 0 refills | Status: DC

## 2018-02-23 NOTE — Discharge Summary (Addendum)
John Lawson, is a 47 y.o. male  DOB July 09, 1970  MRN [id number].  Admission date:  02/12/2018  Admitting Physician  Oralia Manis, MD  Discharge Date:  02/23/2018   Primary MD  [facility]  Recommendations for primary care physician for things to follow:   With PCP in 1 week Follow-up with Dr. Mosetta Pigeon in 1 week   Admission Diagnosis  SOB (shortness of breath) [R06.02] Essential hypertension [I10] Acute on chronic congestive heart failure, unspecified heart failure type (HCC) [I50.9]   Discharge Diagnosis  SOB (shortness of breath) [R06.02] Essential hypertension [I10] Acute on chronic congestive heart failure, unspecified heart failure type (HCC) [I50.9]   Principal Problem:   Acute on chronic diastolic CHF (congestive heart failure) (HCC) Active Problems:   Uncontrolled hypertension   Diabetes (HCC)   CHF (congestive heart failure) (HCC)      Past Medical History:  Diagnosis Date  . Chronic diastolic CHF (congestive heart failure) (HCC)   . Diabetes mellitus without complication (HCC)   . Hypertension     Past Surgical History:  Procedure Laterality Date  . SKIN DEBRIDEMENT    . SPHINCTEROTOMY    . TOE AMPUTATION         History of present illness and  Hospital Course:     Kindly see H&P for history of present illness and admission details, please review complete Labs, Consult reports and Test reports for all details in brief  HPI  from the history and physical done on the day of admission 47 year old male patient with history of chronic diastolic heart failure, diabetes mellitus type 2, essential hypertension, diabetic neuropathy came in with massive edema with shortness of breath, patient also ran out of his diuretics for 5 to 6 days when he came.  Patient is admitted  for acute on chronic diastolic heart failure, uncontrolled hypertension.   Hospital Course   #1 .acute on chronic diastolic heart failure plan patient initially admitted to telemetry but his respiratory status got worse patient had massive edema of both legs and also gained about 30 pounds, initially received IV Lasix but because of his worsening respiratory status received IV Lasix drip, transferred to ICU for BiPAP support.  Patient received Lasix drip for almost 10 days, initially received Lasix drip 10 mg/h but decreased to 8 mg/h, changed to torsemide 40 mg p.o. twice daily since yesterday.  Patient had massive volume overload on admission, gradually his respiratory status also improved, patient lost about 20 kg since admission.  Weight today is 121 kg.  On admission his weight was 140 KG.Marland Kitchen  His baseline weight is 101 kg.  Patient still has edema but he wants to go home so discussed with nephrology Dr. Cherylann Ratel recommended torsemide 40 mg p.o. twice daily, continue spironolactone but discontinue lisinopril because of his renal failure.  Echocardiogram done showed EF of more than 55%.  Discharge medications include metoprolol 20 mg p.o. twice daily, torsemide 40 mg p.o. twice daily, 85 mg p.o. twice daily.  Unable to give lisinopril because of worsening creatinine with renal failure #2. acute respiratory failure with severe fluid overload initially received BiPAP, Lasix drip, now on room air and O2 sat 94% on room air.  Patient also received Foley catheter to monitor urine output while on Lasix drip.  We discontinued the Foley catheter yesterday, patient is voiding well. 3 accelerated hypertension: Improved, continue torsemide now and spironolactone. 4.  COPD exacerbation, patient received antibiotics, steroids, finished course of Omnicef, prednisone  Dosepak. 5.  Leg edema, nonhealing right foot wound, seen by wound care nurse recommended:Cleanse surgical wound to right foot with NS. Apply Santyl to wound  bed.  Cover with NS moist 2x2.  Cover with gauze and kerlix.  Change daily  Continues wound care at home so we will arrange home health nurse.  6 .diabetes mellitus type 2, uncontrolled blood sugar because of steroid that he received for COPD.  Seen by diabetic nurse, stopped metformin, due to renal failure,patient received Lantus, NovoLog with meal coverage will go home with Lantus, mealtime insulin #7 acute kidney injury, baseline creatinine 1.13 from previous records, patient had generalized edema, hypoalbuminemia, patient received IV Lasix drip for diuretic effect, still has considerable volume overload however improved edema, hypoxia.  So transition to torsemide 40 mg p.o. twice daily,Cr still  fluctuating between 1.5-1.6.  Spoke with nephrology, patient is to follow-up with Dr. Mosetta Pigeon for further titration of his diuretics and monitor kidney function closely... discharge Condition: Stable   Follow UP  Follow-up Information    [facility] Follow up on 03/02/2018.   Specialty:  Cardiology Why:  at 10:00am Contact information: [address] [place] [address] [phone]       [facility]. Schedule an appointment as soon as possible for a visit in 1 week(s).   Specialty:  General Practice Contact information: [address] [place] [address] [phone]             Discharge Instructions  and  Discharge Medications      Allergies as of 02/23/2018      Reactions   Tylenol [acetaminophen] Nausea And Vomiting   Takes Percocet at home      Medication List    STOP taking these medications   furosemide 80 MG tablet Commonly known as:  LASIX   lisinopril 10 MG tablet Commonly known as:  PRINIVIL,ZESTRIL   naproxen 500 MG tablet Commonly known as:  NAPROSYN     TAKE these medications   albuterol 108 (90 Base) MCG/ACT inhaler Commonly known  as:  PROVENTIL HFA;VENTOLIN HFA Inhale 2 puffs into the lungs every 6 (six) hours as needed for wheezing.   gabapentin 800 MG tablet Commonly known as:  NEURONTIN Take 800 mg by mouth 3 (three) times daily.   insulin aspart 100 UNIT/ML injection Commonly known as:  novoLOG Inject 0-20 Units into the skin 3 (three) times daily with meals.   insulin detemir 100 UNIT/ML injection Commonly known as:  LEVEMIR Inject 28 Units into the skin daily.   ipratropium-albuterol 0.5-2.5 (3) MG/3ML Soln Commonly known as:  DUONEB Take 3 mLs by nebulization every 4 (four) hours as needed.   metFORMIN 500 MG 24 hr tablet Commonly known as:  GLUCOPHAGE-XR Take 1,000 mg by mouth 2 (two) times daily.   multivitamin with minerals Tabs tablet Take 1 tablet by mouth daily.   omeprazole 20 MG capsule Commonly known as:  PRILOSEC Take 40 mg by mouth daily.   oxyCODONE 15 MG immediate release tablet Commonly known as:  ROXICODONE Take 7.5 mg by mouth every 8 (eight) hours as needed for pain.   spironolactone 25 MG tablet Commonly known as:  ALDACTONE Take 25 mg by mouth daily.   torsemide 20 MG tablet Commonly known as:  DEMADEX Take 2 tablets (40 mg total) by mouth 2 (two) times daily.   traZODone 50 MG tablet Commonly known as:  DESYREL  Take 50 mg by mouth at bedtime as needed for sleep.         Diet and Activity recommendation: See Discharge Instructions above   Consults obtained -, nephrology, diabetic nurse, wound care nurse   Major procedures and Radiology Reports - PLEASE review detailed and final reports for all details, in brief -     Us Renal  Result Date: 02/13/2018 CLINICAL DATA:  Acute kidney insufficiency EXAM: RENAL / URINARY TRACT ULTRASOUND COMPLETE COMPARISON:  None. FINDINGS: Right Kidney: Length: 12.9 cm. There is[place] diffuse increased echotexture of the right kidney. No mass or hydronephrosis visualized. Left Kidney: Length: 14.5 cm. Echogenicity within normal  limits. No mass or hydronephrosis visualized. Bladder: Evaluation of bladder is limited as patient has Foley catheter in place. IMPRESSION: Diffuse increased echotexture of the right kidney. This is nonspecific but can be seen in medical renal disease. No hydronephrosis identified bilaterally. Electronically Signed   By: Sherian ReinWei-Chen  Lin M.D.   On: 02/13/2018 16:36   Dg Chest Port 1 View  Result Date: 02/14/2018 CLINICAL DATA:  Atelectasis EXAM: PORTABLE CHEST 1 VIEW COMPARISON:  02/12/2018 FINDINGS: Increased interstitial markings with a perihilar distribution, increased, favoring mild interstitial edema. Small right pleural effusion. Associated right basilar opacity, likely atelectasis, unchanged. No pneumothorax. Cardiomegaly. IMPRESSION: Cardiomegaly with mild interstitial edema, increased. Small right pleural effusion. Associated right lower lobe opacity, likely atelectasis, unchanged. Electronically Signed   By: Charline BillsSriyesh  Krishnan M.D.   On: 02/14/2018 07:16   Dg Chest Port 1 View  Result Date: 02/12/2018 CLINICAL DATA:  Acute onset of shortness of breath. Expiratory wheezing. EXAM: PORTABLE CHEST 1 VIEW COMPARISON:  Chest radiograph performed 10/23/2014, and CTA of the chest performed 10/24/2014 FINDINGS: A small right pleural effusion is noted, with associated opacity. Increased interstitial markings may reflect mild interstitial edema. Alternatively, right basilar pneumonia could have a similar appearance. No pneumothorax is seen. The cardiomediastinal silhouette is borderline normal in size. No acute osseous abnormalities are identified. IMPRESSION: Small right pleural effusion noted. Increased interstitial markings may reflect mild interstitial edema. Alternatively, right basilar pneumonia could have a similar appearance. Electronically Signed   By: Roanna RaiderJeffery  Chang M.D.   On: 02/12/2018 02:36    Micro Results    Recent Results (from the past 240 hour(s))  MRSA PCR Screening     Status: None    Collection Time: 02/13/18 12:23 PM  Result Value Ref Range Status   MRSA by PCR NEGATIVE NEGATIVE Final    Comment:        The GeneXpert MRSA Assay (FDA approved for NASAL specimens only), is one component of a comprehensive MRSA colonization surveillance program. It is not intended to diagnose MRSA infection nor to guide or monitor treatment for MRSA infections. Performed at [facility], [address] [place], [place] [address]   C difficile quick scan w PCR reflex     Status: None   Collection Time: 02/22/18  3:33 AM  Result Value Ref Range Status   C Diff antigen NEGATIVE NEGATIVE Final   C Diff toxin NEGATIVE NEGATIVE Final   C Diff interpretation No C. difficile detected.  Final    Comment: Performed at [facility], [address] [place], [place] [address]       Today   Subjective:   John Lawson today h is no shortness of breath wants to go home.  Objective:   Blood pressure (!) 127/56, pulse 88, temperature 98.4 F (36.9 C), temperature source Oral, resp. rate 18,  height 6\' 2"  (1.88 m), weight 121.9 kg, SpO2 94 %.   Intake/Output Summary (Last 24 hours) at 02/23/2018 0750 Last data filed at 02/22/2018 1651 Gross per 24 hour  Intake -  Output 2900 ml  Net -2900 ml    Exam Awake Alert, Oriented x 3, No new F.N deficits, Normal affect [place].AT,PERRAL Supple Neck,No JVD, No cervical lymphadenopathy appriciated.  Symmetrical Chest wall movement, Good air movement bilaterally, CTAB RRR,No Gallops,Rubs or new Murmurs, No Parasternal Heave +ve B.Sounds, Abd Soft, Non tender, No organomegaly appriciated, No rebound -guarding or rigidity. No Cyanosis, Clubbing or edema, No new Rash or bruise  Data Review   CBC w Diff:  Lab Results  Component Value Date   WBC 7.8 02/12/2018   HGB 9.7 (L) 02/13/2018   HGB 13.5 10/23/2014   HCT 27.9 (L) 02/12/2018   HCT 41.4 10/23/2014   PLT 332 02/12/2018   PLT 351 10/23/2014    CMP:  Lab Results   Component Value Date   NA 140 02/23/2018   NA 132 (L) 10/23/2014   K 4.7 02/23/2018   K 3.8 10/23/2014   CL 100 02/23/2018   CL 96 (L) 10/23/2014   CO2 33 (H) 02/23/2018   CO2 28 10/23/2014   BUN 46 (H) 02/23/2018   BUN 14 10/23/2014   CREATININE 1.64 (H) 02/23/2018   CREATININE 0.78 10/23/2014   PROT 6.5 02/15/2018   ALBUMIN 2.2 (L) 02/15/2018   BILITOT 0.5 02/15/2018   ALKPHOS 162 (H) 02/15/2018   AST 17 02/15/2018   ALT 26 02/15/2018  .   Total Time in preparing paper work, data evaluation and todays exam - 35 minutes  Katha HammingSnehalatha Jewel Mcafee M.D on 02/23/2018 at 7:50 AM    Note: This dictation was prepared with Dragon dictation along with smaller phrase technology. Any transcriptional errors that result from this process are unintentional.

## 2018-02-23 NOTE — Progress Notes (Signed)
Patient noted lethargic and drowsy while sitting on the side of the bed at about 3am.Patient was nodding and drifting off to sleep in midconversation with this RN.    The bed alarm was placed on for patient's safety.  About 6:30am patient was noted unsteady while walking to the restroom  and lost his balance twice while standing attempting to void in the restroom. Patient expressed frustrations with having a bed alarm on and having staff accompany him to the restroom as he attempted to deny almost falling. Patient then became verbally and physically violent as he began using profanity and throwing the urinal at the wall near the toilet.  RN called in additional staff to the room including security, charge nurse, and Armed forces technical officernursing supervisor. MD is in speaking to patient now. Endorsed to oncoming RN

## 2018-02-23 NOTE — Progress Notes (Signed)
dischargecanceled because Dr. Cherylann RatelLateef told me that patient still edematous urineoutput dropped   since started on torsemide so recommended to restart the Lasix drip.  So restart Lasix drip, hold her discharge.

## 2018-02-23 NOTE — Progress Notes (Signed)
[facility] [place], [place] 02/23/18  Subjective:  Early this morning the patient was a bit upset that he had to be supervisedto move around. Therefore he was requesting discharge. However we counseled the patient that he was high risk for readmission he feels less now. Thereafter patient decided to stay. We talked about discontinuing torsemide and transitioning back to Lasix drip.   Objective:  Vital signs in last 24 hours:  Temp:  [97.9 F (36.6 C)-98.4 F (36.9 C)] 98.4 F (36.9 C) (08/20 0424) Pulse Rate:  [80-88] 88 (08/20 0424) Resp:  [18] 18 (08/20 0424) BP: (127-141)/(56-71) 127/56 (08/20 0424) SpO2:  [94 %-97 %] 94 % (08/20 0800) Weight:  [121.9 kg] 121.9 kg (08/20 0426)  Weight change: 1.281 kg Filed Weights   02/21/18 0423 02/22/18 0406 02/23/18 0426  Weight: 121.7 kg 120.6 kg 121.9 kg    Intake/Output:    Intake/Output Summary (Last 24 hours) at 02/23/2018 1420 Last data filed at 02/23/2018 1016 Gross per 24 hour  Intake 360 ml  Output 300 ml  Net 60 ml    Physical Exam: General:  No acute distress, laying in the bed  HEENT  anicteric, moist oral mucous membranes  Neck:  Supple  Lungs:  Mild bibasilar crackles, normal effort  Heart::  No rub, regular rhythm  Abdomen:  Soft, nontender  Extremities:  2+ severe pitting edema, left big toe amputation, right toe amputation  Neurologic:  Awake, alert, follows ocmmands  Skin:  Congestive hyperemia in LE's         Basic Metabolic Panel:  Recent Labs  Lab 02/18/18 0450 02/19/18 0445 02/20/18 0453 02/21/18 0447 02/22/18 0446 02/23/18 0337  NA 138 139 138 140 141 140  K 4.7 4.3 4.1 4.2 4.1 4.7  CL 99 99 97* 99 97* 100  CO2 31 34* 34* 35* 37* 33*  GLUCOSE 376* 304* 430* 233* 266* 262*  BUN 55* 54* 48* 56* 44* 46*  CREATININE 1.44* 1.29* 1.42* 1.38* 1.30* 1.64*  CALCIUM 8.4* 8.2* 8.1* 8.1* 8.3* 7.7*  MG 2.0  --   --   --   --   --      CBC: No results for input(s): WBC,  NEUTROABS, HGB, HCT, MCV, PLT in the last 168 hours.   No results found for: HEPBSAG, HEPBSAB, HEPBIGM    Microbiology:  Recent Results (from the past 240 hour(s))  C difficile quick scan w PCR reflex     Status: None   Collection Time: 02/22/18  3:33 AM  Result Value Ref Range Status   C Diff antigen NEGATIVE NEGATIVE Final   C Diff toxin NEGATIVE NEGATIVE Final   C Diff interpretation No C. difficile detected.  Final    Comment: Performed at [facility], [address] [place], [place] [address]    Coagulation Studies: No results for input(s): LABPROT, INR in the last 72 hours.  Urinalysis: No results for input(s): COLORURINE, LABSPEC, PHURINE, GLUCOSEU, HGBUR, BILIRUBINUR, KETONESUR, PROTEINUR, UROBILINOGEN, NITRITE, LEUKOCYTESUR in the last 72 hours.  Invalid input(s): APPERANCEUR    Imaging: No results found.   Medications:   . sodium chloride 500 mL (02/15/18 0047)   . budesonide (PULMICORT) nebulizer solution  0.5 mg Nebulization BID  . chlorhexidine  15 mL Mouth Rinse BID  . collagenase   Topical Daily  . enoxaparin (LOVENOX) injection  40 mg Subcutaneous Q24H  . gabapentin  800 mg Oral TID  . insulin aspart  0-20 Units Subcutaneous TID WC  .  insulin aspart  0-5 Units Subcutaneous QHS  . insulin aspart  10 Units Subcutaneous TID WC  . insulin glargine  35 Units Subcutaneous QHS  . ipratropium-albuterol  3 mL Nebulization BID  . lidocaine  2 patch Transdermal Q24H  . lisinopril  10 mg Oral Daily  . mouth rinse  15 mL Mouth Rinse q12n4p  . mouth rinse  15 mL Mouth Rinse BID  . Melatonin  5 mg Oral QHS  . pantoprazole  40 mg Oral BID  . torsemide  40 mg Oral BID  . zolpidem  10 mg Oral QHS   sodium chloride, acetaminophen **OR** acetaminophen, albuterol, hydrALAZINE, ondansetron **OR** ondansetron (ZOFRAN) IV, oxyCODONE  Assessment/ Plan:  47 y.o. male  with HTN, dCHF, Diabetes with complications (neuropathy), history of positive ANA April  2019,  was admitted on 02/12/2018 with massive volume overload  1.  Acute kidney injury.  Baseline creatinine 1.13 from May/27/2019/GFR > 60 2.  Generalized edmea 3.  Hypoalbuminemia 4.  Diabetes mellitus type with CKD.  5.  Hyperkalemia  Plan: Earlier in the day the patient was requesting discharge however after counseling the patient he has decided to stay.  Had a left he would be very high risk for readmission.  We will discontinue torsemide in transition the patient back to Lasix drip at 6 mg per hour.  We'll need to continue to monitor creatinine closely as it is up to 1.64.  However he continues to have significant generalized edema.  We will continue to follow along with you closely.   LOS: 10 Tanazia Achee 8/20/20192:20 PM  [facility] [address] [place], [place] [phone]  Note: This note was prepared with Dragon dictation. Any transcription errors are unintentional

## 2018-02-23 NOTE — Care Management (Addendum)
It was thought patient was to discharge home but nephrology  states patient still has too much fluid.  Patient has access to scales and says usually weighed everyday but "there for a week I didn't and gained 30 pounds." Obtained order for home nebulizer from attending and this will be provided by attending.  Referral called to [facility].  No agency preference for home health RN PT .  Placed [facility] HF Protocol on chart. Requested specific wound care orders for patient's chronic foot wound. Patient will discharge to the home of his ex wife but does not know the address.  Informed patient needed the specific address as home health agency will need it.

## 2018-02-23 NOTE — Progress Notes (Signed)
Physical Therapy Treatment Patient Details Name: John RiggsMichael Lawson MRN: [id number] DOB: 01/30/1971 Today's Date: 02/23/2018    History of Present Illness John RiggsMichael Lawson is a 47 y.o. male brought to the ED from home via EMS with a chief complaint of shortness of breath.  Patient has a history of hypertension, diabetes, CHF who ran out of his Lasix 1 week ago.  Presents with a 50 pound weight gain over the past week, diffuse swelling and shortness of breath with wheezing.  Also complains of chest tightness. PMH of L toe amputation. R foot open ulcers.     PT Comments    Pt is progressing towards goals today, and has very little need for physical assist. Pt is mainly limited by impulsivity and agitation that lead to unsafe movement, and unaware of safety limitations. Pt was able to preform bed mobility with supervision, transfer with supervision, and ambulate 200' with CGA/RW for safety. PT did not don orthopedic shoes prior to ambulation 2/2 pt agitation/impulsivity. See below for mobility specifics. Current d/c recommendation remains appropriate at this time, and PT will continue to assess pt during hospital stay.  Follow Up Recommendations  Home health PT     Equipment Recommendations  Rolling walker with 5" wheels    Recommendations for Other Services       Precautions / Restrictions Precautions Precautions: Fall Restrictions Weight Bearing Restrictions: No Other Position/Activity Restrictions:  B toe amputation    Mobility  Bed Mobility Overal bed mobility: Modified Independent Bed Mobility: Supine to Sit;Sit to Supine     Supine to sit: Supervision;HOB elevated Sit to supine: Supervision;HOB elevated   General bed mobility comments: Pt required supervision 2/2 to impulsivity  Transfers Overall transfer level: Needs assistance Equipment used: Rolling walker (2 wheeled) Transfers: Sit to/from Stand Sit to Stand: Supervision         General transfer comment: Pt did not  elevate bed to stand, but still requires supervision 2/2 to unsteadiness.  Ambulation/Gait Ambulation/Gait assistance: Min guard Gait Distance (Feet): 200 Feet Assistive device: Rolling walker (2 wheeled) Gait Pattern/deviations: Step-through pattern Gait velocity: 10 feet in 6 seconds Gait velocity interpretation: <1.31 ft/sec, indicative of household ambulator General Gait Details: Pt able to ambulate 200' around nurses station with CGA and RW for saftey 2/2 unsteadiness and impulsivity. Pt attempted verbal/tactile cues to prevent hip/trunk flexion and stay closer to RW.   Stairs             Wheelchair Mobility    Modified Rankin (Stroke Patients Only)       Balance Overall balance assessment: Needs assistance Sitting-balance support: No upper extremity supported;Feet supported Sitting balance-Leahy Scale: Good     Standing balance support: Bilateral upper extremity supported Standing balance-Leahy Scale: Fair Standing balance comment: Pt can remove BUE from walker but is unsteady when he does so.                             Cognition Arousal/Alertness: Awake/alert Behavior During Therapy: Agitated;Impulsive Overall Cognitive Status: Within Functional Limits for tasks assessed                                 General Comments: pt was difficult to understand 2/2 to slurred and quiet speech.       Exercises      General Comments        Pertinent Vitals/Pain Pain Assessment:  Faces Faces Pain Scale: Hurts a little bit Pain Location: unclear Pain Descriptors / Indicators: Grimacing Pain Intervention(s): Limited activity within patient's tolerance;Monitored during session;Repositioned    Home Living                      Prior Function            PT Goals (current goals can now be found in the care plan section) Acute Rehab PT Goals Patient Stated Goal: To go home PT Goal Formulation: With patient Time For Goal  Achievement: 02/24/18 Potential to Achieve Goals: Good Progress towards PT goals: Progressing toward goals    Frequency    Min 2X/week      PT Plan Current plan remains appropriate    Co-evaluation              AM-PAC PT "6 Clicks" Daily Activity  Outcome Measure  Difficulty turning over in bed (including adjusting bedclothes, sheets and blankets)?: None Difficulty moving from lying on back to sitting on the side of the bed? : None Difficulty sitting down on and standing up from a chair with arms (e.g., wheelchair, bedside commode, etc,.)?: Unable Help needed moving to and from a bed to chair (including a wheelchair)?: A Little Help needed walking in hospital room?: A Little Help needed climbing 3-5 steps with a railing? : A Little 6 Click Score: 18    End of Session Equipment Utilized During Treatment: Gait belt Activity Tolerance: Patient tolerated treatment well;Treatment limited secondary to agitation Patient left: in bed;with bed alarm set;with call bell/phone within reach(pt aggitated by bed alarm) Nurse Communication: Mobility status;Other (comment)(pt aggitation) PT Visit Diagnosis: Unsteadiness on feet (R26.81);Pain;Difficulty in walking, not elsewhere classified (R26.2);Other symptoms and signs involving the nervous system (R29.898) Pain - Right/Left: (unclear)     Time: [id number] PT Time Calculation (min) (ACUTE ONLY): 19 min  Charges:                        Renaldo HarrisonStephen Aydan Levitz, SPT    Renaldo HarrisonStephen Juanelle Trueheart 02/23/2018, 12:50 PM

## 2018-02-24 LAB — GLUCOSE, CAPILLARY
GLUCOSE-CAPILLARY: 65 mg/dL — AB (ref 70–99)
GLUCOSE-CAPILLARY: 67 mg/dL — AB (ref 70–99)
Glucose-Capillary: 286 mg/dL — ABNORMAL HIGH (ref 70–99)
Glucose-Capillary: 104 mg/dL — ABNORMAL HIGH (ref 70–99)
Glucose-Capillary: 201 mg/dL — ABNORMAL HIGH (ref 70–99)
Glucose-Capillary: 222 mg/dL — ABNORMAL HIGH (ref 70–99)
Glucose-Capillary: 296 mg/dL — ABNORMAL HIGH (ref 70–99)

## 2018-02-24 MED ORDER — INSULIN ASPART 100 UNIT/ML ~~LOC~~ SOLN: 4.0000 [IU] | Freq: Three times a day (TID) | SUBCUTANEOUS | Status: DC

## 2018-02-24 MED ORDER — INSULIN GLARGINE 100 UNIT/ML ~~LOC~~ SOLN
30.0000 [IU] | Freq: Every day | SUBCUTANEOUS | Status: DC
  Administered 2018-02-24 – 2018-02-25 (×2): 30 [IU] via SUBCUTANEOUS
  Filled 2018-02-24 (×3): qty 0.3

## 2018-02-24 NOTE — Progress Notes (Signed)
Hypoglycemic Event  CBG: 65  Treatment: 15 GM carbohydrate snack  Symptoms: Sweaty  Follow-up CBG: Time:1208 CBG Result:104  Possible Reasons for Event: Medication regimen: patient concerned he is getting too much insulin compared to what he takes at home.  Comments/MD notified: Colon BranchKonidena     Kazden Largo B

## 2018-02-24 NOTE — Progress Notes (Signed)
[facility] [place], [place] 02/24/18  Subjective:  Patient with excellent urine output thus far today. Urine output has been 3.6 L. Responding well to Lasix drip. States that his edema is less tight.   Objective:  Vital signs in last 24 hours:  Temp:  [97.4 F (36.3 C)-98.4 F (36.9 C)] 97.4 F (36.3 C) (08/21 0732) Pulse Rate:  [74-82] 77 (08/21 0732) Resp:  [14-18] 18 (08/21 0732) BP: (112-129)/(61-76) 123/76 (08/21 0732) SpO2:  [95 %-99 %] 97 % (08/21 0751) Weight:  [124.8 kg] 124.8 kg (08/21 0436)  Weight change: 2.903 kg Filed Weights   02/22/18 0406 02/23/18 0426 02/24/18 0436  Weight: 120.6 kg 121.9 kg 124.8 kg    Intake/Output:    Intake/Output Summary (Last 24 hours) at 02/24/2018 1140 Last data filed at 02/24/2018 1014 Gross per 24 hour  Intake 360 ml  Output 6700 ml  Net -6340 ml    Physical Exam: General:  No acute distress, laying in the bed  HEENT  anicteric, moist oral mucous membranes  Neck:  Supple  Lungs:  Mild bibasilar crackles, normal effort  Heart::  No rub, regular rhythm  Abdomen:  Soft, nontender  Extremities:  2+ pitting edema, left big toe amputation, right toe amputation  Neurologic:  Awake, alert, follows ocmmands  Skin:  Congestive hyperemia in LE's         Basic Metabolic Panel:  Recent Labs  Lab 02/18/18 0450 02/19/18 0445 02/20/18 0453 02/21/18 0447 02/22/18 0446 02/23/18 0337  NA 138 139 138 140 141 140  K 4.7 4.3 4.1 4.2 4.1 4.7  CL 99 99 97* 99 97* 100  CO2 31 34* 34* 35* 37* 33*  GLUCOSE 376* 304* 430* 233* 266* 262*  BUN 55* 54* 48* 56* 44* 46*  CREATININE 1.44* 1.29* 1.42* 1.38* 1.30* 1.64*  CALCIUM 8.4* 8.2* 8.1* 8.1* 8.3* 7.7*  MG 2.0  --   --   --   --   --      CBC: No results for input(s): WBC, NEUTROABS, HGB, HCT, MCV, PLT in the last 168 hours.   No results found for: HEPBSAG, HEPBSAB, HEPBIGM    Microbiology:  Recent Results (from the past 240 hour(s))  C difficile  quick scan w PCR reflex     Status: None   Collection Time: 02/22/18  3:33 AM  Result Value Ref Range Status   C Diff antigen NEGATIVE NEGATIVE Final   C Diff toxin NEGATIVE NEGATIVE Final   C Diff interpretation No C. difficile detected.  Final    Comment: Performed at [facility], [address] [place], [place] [address]    Coagulation Studies: No results for input(s): LABPROT, INR in the last 72 hours.  Urinalysis: No results for input(s): COLORURINE, LABSPEC, PHURINE, GLUCOSEU, HGBUR, BILIRUBINUR, KETONESUR, PROTEINUR, UROBILINOGEN, NITRITE, LEUKOCYTESUR in the last 72 hours.  Invalid input(s): APPERANCEUR    Imaging: No results found.   Medications:   . sodium chloride 500 mL (02/15/18 0047)  . furosemide (LASIX) infusion 6 mg/hr (02/23/18 1522)   . budesonide (PULMICORT) nebulizer solution  0.5 mg Nebulization BID  . chlorhexidine  15 mL Mouth Rinse BID  . collagenase   Topical Daily  . enoxaparin (LOVENOX) injection  40 mg Subcutaneous Q24H  . gabapentin  800 mg Oral TID  . insulin aspart  0-20 Units Subcutaneous TID WC  . insulin aspart  0-5 Units Subcutaneous QHS  . insulin aspart  10 Units Subcutaneous TID WC  .  insulin glargine  35 Units Subcutaneous QHS  . ipratropium-albuterol  3 mL Nebulization BID  . lidocaine  2 patch Transdermal Q24H  . lisinopril  10 mg Oral Daily  . mouth rinse  15 mL Mouth Rinse BID  . Melatonin  5 mg Oral QHS  . pantoprazole  40 mg Oral BID  . zolpidem  10 mg Oral QHS   sodium chloride, acetaminophen **OR** acetaminophen, albuterol, hydrALAZINE, ondansetron **OR** ondansetron (ZOFRAN) IV, oxyCODONE  Assessment/ Plan:  47 y.o. male  with HTN, dCHF, Diabetes with complications (neuropathy), history of positive ANA April 2019,  was admitted on 02/12/2018 with massive volume overload  1.  Acute kidney injury.  Baseline creatinine 1.13 from May/27/2019/GFR > 60 2.  Generalized edema 3.  Hypoalbuminemia 4.  Diabetes  mellitus type with CKD.  5.  Hyperkalemia  Plan: Patient was placed back on Lasix drip yesterday.  He is having good urine output and thus far today urine output has been 3.6 L.  Continue Lasix drip for another 24 to 48 hours.  Continue to monitor renal parameters periodically.  Otherwise plan per hospitalist.   LOS: 11 Chasty Randal 8/21/201911:40 AM  [facility] [place], [place] [phone]  Note: This note was prepared with Dragon dictation. Any transcription errors are unintentional

## 2018-02-24 NOTE — Progress Notes (Signed)
[employer] -  at [facility]                                                                                                                                                                                  Patient Demographics   John RiggsMichael Lawson, is a 47 y.o. male, DOB - 08/17/1970, WUJ:[id number]  Admit date - 02/12/2018   Admitting Physician Oralia Manisavid Willis, MD  Outpatient Primary [facility] the [facility]   LOS - 11  dischargecanceled because patient started back on Lasix drip.  Persistent edema, decreased urine output on torsemide started back on Lasix drip by nephrology. Review of Systems:   CONSTITUTIONAL: No documented fever. No fatigue, weakness. No weight gain, no weight loss.  EYES: No blurry or double vision.  ENT: No tinnitus. No postnasal drip. No redness of the oropharynx.  RESPIRATORY: No cough, no wheeze, no hemoptysis.  Positive dyspnea.  CARDIOVASCULAR: No chest pain. No orthopnea. No palpitations. No syncope.  Positive edema GASTROINTESTINAL: No nausea, no vomiting or diarrhea. No abdominal pain. No melena or hematochezia.  GENITOURINARY: No dysuria or hematuria.  ENDOCRINE: No polyuria or nocturia. No heat or cold intolerance.  HEMATOLOGY: No anemia. No bruising. No bleeding.  INTEGUMENTARY: No rashes. No lesions.  MUSCULOSKELETAL: No arthritis. No swelling. No gout.  NEUROLOGIC: No numbness, tingling, or ataxia. No seizure-type activity.  PSYCHIATRIC: No anxiety. No insomnia. No ADD.    Vitals:   Vitals:   02/24/18 0404 02/24/18 0436 02/24/18 0732 02/24/18 0751  BP: 129/63  123/76   Pulse: 82  77   Resp: 18  18   Temp: 98.4 F (36.9 C)  (!) 97.4 F (36.3 C)   TempSrc: Oral  Oral   SpO2: 96%  96% 97%  Weight:  124.8 kg    Height:        Wt Readings from Last 3 Encounters:  02/24/18 124.8 kg     Intake/Output Summary (Last 24 hours) at 02/24/2018 1420 Last data filed at 02/24/2018 1014 Gross per 24  hour  Intake -  Output 5700 ml  Net -5700 ml    Physical Exam:   GENERAL: Accessory muscle usage HEAD, EYES, EARS, NOSE AND THROAT: Atraumatic, normocephalic. Extraocular muscles are intact. Pupils equal and reactive to light. Sclerae anicteric. No conjunctival injection. No oro-pharyngeal erythema.  NECK: Supple. There is no jugular venous distention. No bruits, no lymphadenopathy, no thyromegaly.  HEART: Regular rate and rhythm,. No murmurs, no rubs, no clicks.  LUNGS: Clear to auscultation. Has good bowel sounds. No hepatosplenomegaly appreciated.  EXTREMITIES: Positive edema in both lower extremity, better than before. NEUROLOGIC: Drowsy SKIN: Moist and warm  with no rashes appreciated.  Scrotal swelling significant Psych: Not anxious, depressed LN: No inguinal LN enlargement    Antibiotics   Anti-infectives (From admission, onward)   Start     Dose/Rate Route Frequency Ordered Stop   02/17/18 1000  cefdinir (OMNICEF) capsule 300 mg  Status:  Discontinued     300 mg Oral Every 12 hours 02/16/18 1259 02/22/18 1403   02/13/18 1200  cefTRIAXone (ROCEPHIN) 1 g in sodium chloride 0.9 % 100 mL IVPB  Status:  Discontinued     1 g 200 mL/hr over 30 Minutes Intravenous Daily 02/13/18 1125 02/16/18 1259   02/13/18 1200  azithromycin (ZITHROMAX) tablet 500 mg  Status:  Discontinued     500 mg Oral Daily 02/13/18 1125 02/16/18 1259      Medications   Scheduled Meds: . budesonide (PULMICORT) nebulizer solution  0.5 mg Nebulization BID  . chlorhexidine  15 mL Mouth Rinse BID  . collagenase   Topical Daily  . enoxaparin (LOVENOX) injection  40 mg Subcutaneous Q24H  . gabapentin  800 mg Oral TID  . insulin aspart  0-20 Units Subcutaneous TID WC  . insulin aspart  0-5 Units Subcutaneous QHS  . insulin glargine  30 Units Subcutaneous QHS  . ipratropium-albuterol  3 mL Nebulization BID  . lidocaine  2 patch Transdermal Q24H  . lisinopril  10 mg Oral Daily  . mouth rinse  15 mL Mouth  Rinse BID  . Melatonin  5 mg Oral QHS  . pantoprazole  40 mg Oral BID  . zolpidem  10 mg Oral QHS   Continuous Infusions: . sodium chloride 500 mL (02/15/18 0047)  . furosemide (LASIX) infusion 6 mg/hr (02/23/18 1522)   PRN Meds:.sodium chloride, acetaminophen **OR** acetaminophen, albuterol, hydrALAZINE, ondansetron **OR** ondansetron (ZOFRAN) IV, oxyCODONE   Data Review:   Micro Results Recent Results (from the past 240 hour(s))  C difficile quick scan w PCR reflex     Status: None   Collection Time: 02/22/18  3:33 AM  Result Value Ref Range Status   C Diff antigen NEGATIVE NEGATIVE Final   C Diff toxin NEGATIVE NEGATIVE Final   C Diff interpretation No C. difficile detected.  Final    Comment: Performed at [facility], [address] [place], [place] [address]    Radiology Reports US Renal  Result Date: 02/13/2018 CLINICAL DATA:  Acute kidney insufficiency EXAM: RENAL / URINARY TRACT ULTRASOUND COMPLETE COMPARISON:  None. FINDINGS: Right Kidney: Length: 12.9 cm. There is diffuse increased echotexture of the right kidney. No mass or hydronephrosis visualized. Left Kidney: Length: 14.5 cm. Echogenicity within normal limits. No mass or hydronephrosis visualized. Bladder: Evaluation of bladder is limited as patient has Foley catheter in place. IMPRESSION: Diffuse increased echotexture of the right kidney. This is nonspecific but can be seen in medical renal disease. No hydronephrosis identified bilaterally. Electronically Signed   By: Sherian Rein M.D.   On: 02/13/2018 16:36   Dg Chest Port 1 View  Result Date: 02/14/2018 CLINICAL DATA:  Atelectasis EXAM: PORTABLE CHEST 1 VIEW COMPARISON:  02/12/2018 FINDINGS: Increased interstitial markings with a perihilar distribution, increased, favoring mild interstitial edema. Small right pleural effusion. Associated right basilar opacity, likely atelectasis, unchanged. No pneumothorax. Cardiomegaly. IMPRESSION: Cardiomegaly with  mild interstitial edema, increased. Small right pleural effusion. Associated right lower lobe opacity, likely atelectasis, unchanged. Electronically Signed   By: Charline Bills M.D.   On: 02/14/2018 07:16   Dg Chest Port 1 View  Result Date: 02/12/2018  CLINICAL DATA:  Acute onset of shortness of breath. Expiratory wheezing. EXAM: PORTABLE CHEST 1 VIEW COMPARISON:  Chest radiograph performed 10/23/2014, and CTA of the chest performed 10/24/2014 FINDINGS: A small right pleural effusion is noted, with associated opacity. Increased interstitial markings may reflect mild interstitial edema. Alternatively, right basilar pneumonia could have a similar appearance. No pneumothorax is seen. The cardiomediastinal silhouette is borderline normal in size. No acute osseous abnormalities are identified. IMPRESSION: Small right pleural effusion noted. Increased interstitial markings may reflect mild interstitial edema. Alternatively, right basilar pneumonia could have a similar appearance. Electronically Signed   By: Roanna RaiderJeffery  Chang M.D.   On: 02/12/2018 02:36     CBC No results for input(s): WBC, HGB, HCT, PLT, MCV, MCH, MCHC, RDW, LYMPHSABS, MONOABS, EOSABS, BASOSABS, BANDABS in the last 168 hours.  Invalid input(s): NEUTRABS, BANDSABD  Chemistries  Recent Labs  Lab 02/18/18 0450 02/19/18 0445 02/20/18 0453 02/21/18 0447 02/22/18 0446 02/23/18 0337  NA 138 139 138 140 141 140  K 4.7 4.3 4.1 4.2 4.1 4.7  CL 99 99 97* 99 97* 100  CO2 31 34* 34* 35* 37* 33*  GLUCOSE 376* 304* 430* 233* 266* 262*  BUN 55* 54* 48* 56* 44* 46*  CREATININE 1.44* 1.29* 1.42* 1.38* 1.30* 1.64*  CALCIUM 8.4* 8.2* 8.1* 8.1* 8.3* 7.7*  MG 2.0  --   --   --   --   --    ------------------------------------------------------------------------------------------------------------------ estimated creatinine clearance is 76.4 mL/min (A) (by C-G formula based on SCr of 1.64 mg/dL  (H)). ------------------------------------------------------------------------------------------------------------------ No results for input(s): HGBA1C in the last 72 hours. ------------------------------------------------------------------------------------------------------------------ No results for input(s): CHOL, HDL, LDLCALC, TRIG, CHOLHDL, LDLDIRECT in the last 72 hours. ------------------------------------------------------------------------------------------------------------------ No results for input(s): TSH, T4TOTAL, T3FREE, THYROIDAB in the last 72 hours.  Invalid input(s): FREET3 ------------------------------------------------------------------------------------------------------------------ No results for input(s): VITAMINB12, FOLATE, FERRITIN, TIBC, IRON, RETICCTPCT in the last 72 hours.  Coagulation profile No results for input(s): INR, PROTIME in the last 168 hours.  No results for input(s): DDIMER in the last 72 hours.  Cardiac Enzymes No results for input(s): CKMB, TROPONINI, MYOGLOBIN in the last 168 hours.  Invalid input(s): CK ------------------------------------------------------------------------------------------------------------------ Invalid input(s): POCBNP    Assessment & Plan  Patient is 47 year old noncompliant with his CHF regimen presenting with worsening shortness of breath and swelling   1.  Acute hypoxic respiratory failure severe fluid overload, improved.  Patient is on room air now.  2.Acute on chronic diastolic CHF (congestive heart failure) (HCC) - Improving, Started back Lasix drip, monitor closely.  Watch for hypokalemia, hypomagnesemia while on diuretics. 3.    Accelerated hypertension pressure stable Continue lisinopril add IV hydralazine as needed blood pressure stable  4  COPD continue p.o. steroids, Omnicef today.  5  Diabetes (HCC) -continue sliding scale insulin with coverage, Lantus.   6  Leg wounds and diabetic foot wound  -blistering leg wounds due to his edema, treatment for edema as above,    7.  GERD continue Protonix  moreThan 50% time spent in counseling, coordination of care    Code Status Orders  (From admission, onward)         Start     Ordered   02/12/18 0339  Full code  Continuous     02/12/18 0338        Code Status History    This patient has a current code status but no historical code status.           Consults cardiology  DVT Prophylaxis  Lovenox  Lab Results  Component Value Date   PLT 332 02/12/2018     Time Spent in minutes   35 minutes spent Katha Hamming M.D on 02/24/2018 at 2:20 PM  Between 7am to 6pm - Pager - [phone]  After 6pm go to [email or web address] - Social research officer, government  [employer]   Office  [phone]

## 2018-02-24 NOTE — Progress Notes (Signed)
Inpatient Diabetes Program Recommendations  AACE/ADA: New Consensus Statement on Inpatient Glycemic Control (2019)  Target Ranges:  Prepandial:   less than 140 mg/dL      Peak postprandial:   less than 180 mg/dL (1-2 hours)      Critically ill patients:  140 - 180 mg/dL  Results for John RiggsVANS, John Lawson (MRN [id number]) as of 02/24/2018 11:32  Ref. Range 02/23/2018 00:35 02/23/2018 09:18 02/23/2018 11:37 02/23/2018 16:46 02/23/2018 21:07 02/24/2018 07:30 02/24/2018 11:18  Glucose-Capillary Latest Ref Range: 70 - 99 mg/dL 409271 (H) 811312 (H)  Novolog 25 units 84 131 (H)  Novolog 10 units 86 286 (H)  Novolog 21 units 65 (L)    Review of Glycemic Control  Diabetes history: DM2 Outpatient Diabetes medications: Lantus 28 units QHS, Metformin 2000 mg daily Current orders for Inpatient glycemic control: Lantus 35 units QHS, Novolog 10 units TID with meals, Novolog 0-20 units TID with meals, Novolog 0-5 units QHS  Inpatient Diabetes Program Recommendations:  Insulin-Basal: Noted patient did NOT receive Lantus last night (patient refused). As a result fasting glucose up to 286 mg/dl this morning.  May want to consider decreasing Lantus to 30 units QHS. Insulin - Meal Coverage: Noted glucose of 65 mg/dl today at 91:4711:18 am. Please consider decreasing meal coverage to Novolog 7 units TID with meals if patient eats at least 50% of meals.  Thanks, Orlando PennerMarie Quanita Barona, RN, MSN, CDE Diabetes Coordinator Inpatient Diabetes Program [phone] (Team Pager from 8am to 5pm)

## 2018-02-24 NOTE — Care Management (Signed)
Patient's home nebulizer ha been delivered to his room.

## 2018-02-24 NOTE — Progress Notes (Signed)
[employer] - [place] at [facility]                                                                                                                                                                                  Patient Demographics   John Lawson, is a 47 y.o. male, DOB - 1970-10-06, GMW:[id number]  Admit date - 02/12/2018   Admitting Physician Oralia Manis, MD  Outpatient Primary MD for the patient is [facility]   LOS - 11   on IV Lasix drip.  Output is good, no shortness of breath. Review of Systems:   CONSTITUTIONAL: No documented fever. No fatigue, weakness. No weight gain, no weight loss.  EYES: No blurry or double vision.  ENT: No tinnitus. No postnasal drip. No redness of the oropharynx.  RESPIRATORY: No cough, no wheeze, no hemoptysis.  Positive dyspnea.  CARDIOVASCULAR: No chest pain. No orthopnea. No palpitations. No syncope.  Positive edema GASTROINTESTINAL: No nausea, no vomiting or diarrhea. No abdominal pain. No melena or hematochezia.  GENITOURINARY: No dysuria or hematuria.  ENDOCRINE: No polyuria or nocturia. No heat or cold intolerance.  HEMATOLOGY: No anemia. No bruising. No bleeding.  INTEGUMENTARY: No rashes. No lesions.  MUSCULOSKELETAL: No arthritis. No swelling. No gout.  NEUROLOGIC: No numbness, tingling, or ataxia. No seizure-type activity.  PSYCHIATRIC: No anxiety. No insomnia. No ADD.    Vitals:   Vitals:   02/24/18 0404 02/24/18 0436 02/24/18 0732 02/24/18 0751  BP: 129/63  123/76   Pulse: 82  77   Resp: 18  18   Temp: 98.4 F (36.9 C)  (!) 97.4 F (36.3 C)   TempSrc: Oral  Oral   SpO2: 96%  96% 97%  Weight:  124.8 kg    Height:        Wt Readings from Last 3 Encounters:  02/24/18 124.8 kg     Intake/Output Summary (Last 24 hours) at 02/24/2018 1418 Last data filed at 02/24/2018 1014 Gross per 24 hour  Intake -  Output 5700 ml  Net -5700 ml    Physical Exam:   GENERAL: Accessory muscle  usage HEAD, EYES, EARS, NOSE AND THROAT: Atraumatic, normocephalic. Extraocular muscles are intact. Pupils equal and reactive to light. Sclerae anicteric. No conjunctival injection. No oro-pharyngeal erythema.  NECK: Supple. There is no jugular venous distention. No bruits, no lymphadenopathy, no thyromegaly.  HEART: Regular rate and rhythm,. No murmurs, no rubs, no clicks.  LUNGS: Clear to auscultation. Has good bowel sounds. No hepatosplenomegaly appreciated.  EXTREMITIES: Positive edema in both lower extremity, better than before. NEUROLOGIC: Drowsy SKIN: Moist and warm with no rashes appreciated.  Scrotal swelling significant Psych: Not  anxious, depressed LN: No inguinal LN enlargement    Antibiotics   Anti-infectives (From admission, onward)   Start     Dose/Rate Route Frequency Ordered Stop   02/17/18 1000  cefdinir (OMNICEF) capsule 300 mg  Status:  Discontinued     300 mg Oral Every 12 hours 02/16/18 1259 02/22/18 1403   02/13/18 1200  cefTRIAXone (ROCEPHIN) 1 g in sodium chloride 0.9 % 100 mL IVPB  Status:  Discontinued     1 g 200 mL/hr over 30 Minutes Intravenous Daily 02/13/18 1125 02/16/18 1259   02/13/18 1200  azithromycin (ZITHROMAX) tablet 500 mg  Status:  Discontinued     500 mg Oral Daily 02/13/18 1125 02/16/18 1259      Medications   Scheduled Meds: . budesonide (PULMICORT) nebulizer solution  0.5 mg Nebulization BID  . chlorhexidine  15 mL Mouth Rinse BID  . collagenase   Topical Daily  . enoxaparin (LOVENOX) injection  40 mg Subcutaneous Q24H  . gabapentin  800 mg Oral TID  . insulin aspart  0-20 Units Subcutaneous TID WC  . insulin aspart  0-5 Units Subcutaneous QHS  . insulin glargine  30 Units Subcutaneous QHS  . ipratropium-albuterol  3 mL Nebulization BID  . lidocaine  2 patch Transdermal Q24H  . lisinopril  10 mg Oral Daily  . mouth rinse  15 mL Mouth Rinse BID  . Melatonin  5 mg Oral QHS  . pantoprazole  40 mg Oral BID  . zolpidem  10 mg Oral QHS    Continuous Infusions: . sodium chloride 500 mL (02/15/18 0047)  . furosemide (LASIX) infusion 6 mg/hr (02/23/18 1522)   PRN Meds:.sodium chloride, acetaminophen **OR** acetaminophen, albuterol, hydrALAZINE, ondansetron **OR** ondansetron (ZOFRAN) IV, oxyCODONE   Data Review:   Micro Results Recent Results (from the past 240 hour(s))  C difficile quick scan w PCR reflex     Status: None   Collection Time: 02/22/18  3:33 AM  Result Value Ref Range Status   C Diff antigen NEGATIVE NEGATIVE Final   C Diff toxin NEGATIVE NEGATIVE Final   C Diff interpretation No C. difficile detected.  Final    Comment: Performed at [facility], [address] [place], [place] [address]    Radiology Reports US Renal  Result Date: 02/13/2018 CLINICAL DATA:  Acute kidney insufficiency EXAM: RENAL / URINARY TRACT ULTRASOUND COMPLETE COMPARISON:  None. FINDINGS: Right Kidney: Length: 12.9 cm. There is diffuse increased echotexture of the right kidney. No mass or hydronephrosis visualized. Left Kidney: Length: 14.5 cm. Echogenicity within normal limits. No mass or hydronephrosis visualized. Bladder: Evaluation of bladder is limited as patient has Foley catheter in place. IMPRESSION: Diffuse increased echotexture of the right kidney. This is nonspecific but can be seen in medical renal disease. No hydronephrosis identified bilaterally. Electronically Signed   By: Sherian Rein M.D.   On: 02/13/2018 16:36   Dg Chest Port 1 View  Result Date: 02/14/2018 CLINICAL DATA:  Atelectasis EXAM: PORTABLE CHEST 1 VIEW COMPARISON:  02/12/2018 FINDINGS: Increased interstitial markings with a perihilar distribution, increased, favoring mild interstitial edema. Small right pleural effusion. Associated right basilar opacity, likely atelectasis, unchanged. No pneumothorax. Cardiomegaly. IMPRESSION: Cardiomegaly with mild interstitial edema, increased. Small right pleural effusion. Associated right lower lobe opacity,  likely atelectasis, unchanged. Electronically Signed   By: Charline Bills M.D.   On: 02/14/2018 07:16   Dg Chest Port 1 View  Result Date: 02/12/2018 CLINICAL DATA:  Acute onset of shortness of breath. Expiratory  wheezing. EXAM: PORTABLE CHEST 1 VIEW COMPARISON:  Chest radiograph performed 10/23/2014, and CTA of the chest performed 10/24/2014 FINDINGS: A small right pleural effusion is noted, with associated opacity. Increased interstitial markings may reflect mild interstitial edema. Alternatively, right basilar pneumonia could have a similar appearance. No pneumothorax is seen. The cardiomediastinal silhouette is borderline normal in size. No acute osseous abnormalities are identified. IMPRESSION: Small right pleural effusion noted. Increased interstitial markings may reflect mild interstitial edema. Alternatively, right basilar pneumonia could have a similar appearance. Electronically Signed   By: Roanna RaiderJeffery  Chang M.D.   On: 02/12/2018 02:36     CBC No results for input(s): WBC, HGB, HCT, PLT, MCV, MCH, MCHC, RDW, LYMPHSABS, MONOABS, EOSABS, BASOSABS, BANDABS in the last 168 hours.  Invalid input(s): NEUTRABS, BANDSABD  Chemistries  Recent Labs  Lab 02/18/18 0450 02/19/18 0445 02/20/18 0453 02/21/18 0447 02/22/18 0446 02/23/18 0337  NA 138 139 138 140 141 140  K 4.7 4.3 4.1 4.2 4.1 4.7  CL 99 99 97* 99 97* 100  CO2 31 34* 34* 35* 37* 33*  GLUCOSE 376* 304* 430* 233* 266* 262*  BUN 55* 54* 48* 56* 44* 46*  CREATININE 1.44* 1.29* 1.42* 1.38* 1.30* 1.64*  CALCIUM 8.4* 8.2* 8.1* 8.1* 8.3* 7.7*  MG 2.0  --   --   --   --   --    ------------------------------------------------------------------------------------------------------------------ estimated creatinine clearance is 76.4 mL/min (A) (by C-G formula based on SCr of 1.64 mg/dL (H)). ------------------------------------------------------------------------------------------------------------------ No results for input(s): HGBA1C  in the last 72 hours. ------------------------------------------------------------------------------------------------------------------ No results for input(s): CHOL, HDL, LDLCALC, TRIG, CHOLHDL, LDLDIRECT in the last 72 hours. ------------------------------------------------------------------------------------------------------------------ No results for input(s): TSH, T4TOTAL, T3FREE, THYROIDAB in the last 72 hours.  Invalid input(s): FREET3 ------------------------------------------------------------------------------------------------------------------ No results for input(s): VITAMINB12, FOLATE, FERRITIN, TIBC, IRON, RETICCTPCT in the last 72 hours.  Coagulation profile No results for input(s): INR, PROTIME in the last 168 hours.  No results for input(s): DDIMER in the last 72 hours.  Cardiac Enzymes No results for input(s): CKMB, TROPONINI, MYOGLOBIN in the last 168 hours.  Invalid input(s): CK ------------------------------------------------------------------------------------------------------------------ Invalid input(s): POCBNP    Assessment & Plan  Patient is 47 year old noncompliant with his CHF regimen presenting with worsening shortness of breath and swelling   1.  Acute hypoxic respiratory failure severe fluid overload, improved.  Patient is on room air now.  2.Acute on chronic diastolic CHF (congestive heart failure) (HCC) - Improving, reStarted IV Lasix drip per nephrology recommends to continue another 24 to 48 hours.  Watch for hypokalemia, hypomagnesemia while on diuretics. 3.    Accelerated hypertension pressure stable Continue lisinopril add IV hydralazine as needed blood pressure stable  4  COPD , history of recent exacerbation, received steroids, Omnicef.  Patient blood sugar was elevated when he was on steroids and now steroids are off and blood sugars are on the lower side so I decreased Lantus dose, stop mealtime coverage because of hypoglycemia  concern. 5  Diabetes (HCC) -continue sliding scale insulin with coverage, Lantus.   6  Leg wounds and diabetic foot wound -blistering leg wounds due to his edema, treatment for edema as above,    7.  GERD continue Protonix  moreThan 50% time spent in counseling, coordination of care    Code Status Orders  (From admission, onward)         Start     Ordered   02/12/18 0339  Full code  Continuous     02/12/18 0338  Code Status History    This patient has a current code status but no historical code status.           Consults cardiology  DVT Prophylaxis  Lovenox  Lab Results  Component Value Date   PLT 332 02/12/2018     Time Spent in minutes   35 minutes spent Katha HammingSnehalatha Serita Degroote M.D on 02/24/2018 at 2:18 PM  Between 7am to 6pm - Pager - [phone]  After 6pm go to [email or web address] - Social research officer, governmentpassword [email or web address]  [employer]   Office  [phone]

## 2018-02-25 LAB — GLUCOSE, CAPILLARY
GLUCOSE-CAPILLARY: 97 mg/dL (ref 70–99)
GLUCOSE-CAPILLARY: 242 mg/dL — AB (ref 70–99)
Glucose-Capillary: 170 mg/dL — ABNORMAL HIGH (ref 70–99)
Glucose-Capillary: 143 mg/dL — ABNORMAL HIGH (ref 70–99)
Glucose-Capillary: 200 mg/dL — ABNORMAL HIGH (ref 70–99)

## 2018-02-25 LAB — BASIC METABOLIC PANEL
Anion gap: 7 (ref 5–15)
BUN: 63 mg/dL — AB (ref 6–20)
CALCIUM: 8 mg/dL — AB (ref 8.9–10.3)
CO2: 32 mmol/L (ref 22–32)
Chloride: 100 mmol/L (ref 98–111)
Creatinine, Ser: 1.78 mg/dL — ABNORMAL HIGH (ref 0.61–1.24)
GFR calc Af Amer: 50 mL/min — ABNORMAL LOW (ref >60)
GFR, EST NON AFRICAN AMERICAN: 43 mL/min — AB (ref >60)
GLUCOSE: 226 mg/dL — AB (ref 70–99)
Potassium: 4.8 mmol/L (ref 3.5–5.1)
Sodium: 139 mmol/L (ref 135–145)

## 2018-02-25 MED ORDER — ALBUMIN HUMAN 25 % IV SOLN
25.0000 g | Freq: Three times a day (TID) | INTRAVENOUS | Status: DC
  Administered 2018-02-25 – 2018-02-26 (×4): 25 g via INTRAVENOUS
  Filled 2018-02-25 (×10): qty 100

## 2018-02-25 MED ORDER — INSULIN ASPART 100 UNIT/ML ~~LOC~~ SOLN
5.0000 [IU] | Freq: Three times a day (TID) | SUBCUTANEOUS | Status: DC
  Administered 2018-02-25 – 2018-02-26 (×3): 5 [IU] via SUBCUTANEOUS
  Filled 2018-02-25 (×3): qty 1

## 2018-02-25 NOTE — Progress Notes (Signed)
[employer] - [place] at [facility]                                                                                                                                                                                  Patient Demographics   John Lawson, is a 47 y.o. male, DOB - 1970/12/23, ZOX:[id number]  Admit date - 02/12/2018   Admitting Physician Oralia Manis, MD  Outpatient Primary MD for the patient is [facility]   LOS - 12  Patient is seen at bedside, weight is down around 3 kg less.  On Lasix drip.  Discussed with the nephrology, continue the Lasix drip for another day or 2.  Patient denies any complaints, hypoglycemia resolved, decreased Lantus insulin yesterday. Review of Systems:   CONSTITUTIONAL: No documented fever. No fatigue, weakness. No weight gain, no weight loss.  EYES: No blurry or double vision.  ENT: No tinnitus. No postnasal drip. No redness of the oropharynx.  RESPIRATORY: No cough, no wheeze, no hemoptysis.  Positive dyspnea.  CARDIOVASCULAR: No chest pain. No orthopnea. No palpitations. No syncope.  Positive edema GASTROINTESTINAL: No nausea, no vomiting or diarrhea. No abdominal pain. No melena or hematochezia.  GENITOURINARY: No dysuria or hematuria.  ENDOCRINE: No polyuria or nocturia. No heat or cold intolerance.  HEMATOLOGY: No anemia. No bruising. No bleeding.  INTEGUMENTARY: No rashes. No lesions.  MUSCULOSKELETAL: No arthritis. No swelling. No gout.  NEUROLOGIC: No numbness, tingling, or ataxia. No seizure-type activity.  PSYCHIATRIC: No anxiety. No insomnia. No ADD.    Vitals:   Vitals:   02/24/18 1913 02/24/18 2005 02/25/18 0406 02/25/18 0818  BP: (!) 146/75  (!) 96/49 119/64  Pulse: 87  80 93  Resp: 18  18 14   Temp: 98 F (36.7 C)  98.7 F (37.1 C) (!) 97.4 F (36.3 C)  TempSrc: Oral     SpO2: 91% 92% 97% 96%  Weight:   121.2 kg   Height:        Wt Readings from Last 3 Encounters:  02/25/18 121.2  kg     Intake/Output Summary (Last 24 hours) at 02/25/2018 1625 Last data filed at 02/25/2018 1406 Gross per 24 hour  Intake 1167.38 ml  Output 4800 ml  Net -3632.62 ml    Physical Exam:   GENERAL: Accessory muscle usage HEAD, EYES, EARS, NOSE AND THROAT: Atraumatic, normocephalic. Extraocular muscles are intact. Pupils equal and reactive to light. Sclerae anicteric. No conjunctival injection. No oro-pharyngeal erythema.  NECK: Supple. There is no jugular venous distention. No bruits, no lymphadenopathy, no thyromegaly.  HEART: Regular rate and rhythm,. No murmurs, no rubs, no clicks.  LUNGS: Clear to auscultation.  Has good bowel sounds. No hepatosplenomegaly appreciated.  EXTREMITIES: Positive edema in both lower extremity, better than before. NEUROLOGIC: Drowsy SKIN: Moist and warm with no rashes appreciated.  Scrotal swelling significant Psych: Not anxious, depressed LN: No inguinal LN enlargement    Antibiotics   Anti-infectives (From admission, onward)   Start     Dose/Rate Route Frequency Ordered Stop   02/17/18 1000  cefdinir (OMNICEF) capsule 300 mg  Status:  Discontinued     300 mg Oral Every 12 hours 02/16/18 1259 02/22/18 1403   02/13/18 1200  cefTRIAXone (ROCEPHIN) 1 g in sodium chloride 0.9 % 100 mL IVPB  Status:  Discontinued     1 g 200 mL/hr over 30 Minutes Intravenous Daily 02/13/18 1125 02/16/18 1259   02/13/18 1200  azithromycin (ZITHROMAX) tablet 500 mg  Status:  Discontinued     500 mg Oral Daily 02/13/18 1125 02/16/18 1259      Medications   Scheduled Meds: . budesonide (PULMICORT) nebulizer solution  0.5 mg Nebulization BID  . chlorhexidine  15 mL Mouth Rinse BID  . collagenase   Topical Daily  . enoxaparin (LOVENOX) injection  40 mg Subcutaneous Q24H  . gabapentin  800 mg Oral TID  . insulin aspart  0-20 Units Subcutaneous TID WC  . insulin aspart  0-5 Units Subcutaneous QHS  . insulin glargine  30 Units Subcutaneous QHS  .  ipratropium-albuterol  3 mL Nebulization BID  . lidocaine  2 patch Transdermal Q24H  . lisinopril  10 mg Oral Daily  . mouth rinse  15 mL Mouth Rinse BID  . Melatonin  5 mg Oral QHS  . pantoprazole  40 mg Oral BID  . zolpidem  10 mg Oral QHS   Continuous Infusions: . sodium chloride 500 mL (02/15/18 0047)  . albumin human 25 g (02/25/18 1306)  . furosemide (LASIX) infusion 6 mg/hr (02/25/18 1252)   PRN Meds:.sodium chloride, acetaminophen **OR** acetaminophen, albuterol, hydrALAZINE, ondansetron **OR** ondansetron (ZOFRAN) IV, oxyCODONE   Data Review:   Micro Results Recent Results (from the past 240 hour(s))  C difficile quick scan w PCR reflex     Status: None   Collection Time: 02/22/18  3:33 AM  Result Value Ref Range Status   C Diff antigen NEGATIVE NEGATIVE Final   C Diff toxin NEGATIVE NEGATIVE Final   C Diff interpretation No C. difficile detected.  Final    Comment: Performed at [facility], [address], [place], [place] [address]    Radiology Reports US Renal  Result Date: 02/13/2018 CLINICAL DATA:  Acute kidney insufficiency EXAM: RENAL / URINARY TRACT ULTRASOUND COMPLETE COMPARISON:  None. FINDINGS: Right Kidney: Length: 12.9 cm. There is diffuse increased echotexture of the right kidney. No mass or hydronephrosis visualized. Left Kidney: Length: 14.5 cm. Echogenicity within normal limits. No mass or hydronephrosis visualized. Bladder: Evaluation of bladder is limited as patient has Foley catheter in place. IMPRESSION: Diffuse increased echotexture of the right kidney. This is nonspecific but can be seen in medical renal disease. No hydronephrosis identified bilaterally. Electronically Signed   By: Sherian Rein M.D.   On: 02/13/2018 16:36   Dg Chest Port 1 View  Result Date: 02/14/2018 CLINICAL DATA:  Atelectasis EXAM: PORTABLE CHEST 1 VIEW COMPARISON:  02/12/2018 FINDINGS: Increased interstitial markings with a perihilar distribution, increased,  favoring mild interstitial edema. Small right pleural effusion. Associated right basilar opacity, likely atelectasis, unchanged. No pneumothorax. Cardiomegaly. IMPRESSION: Cardiomegaly with mild interstitial edema, increased. Small right pleural effusion. Associated  right lower lobe opacity, likely atelectasis, unchanged. Electronically Signed   By: Charline Bills M.D.   On: 02/14/2018 07:16   Dg Chest Port 1 View  Result Date: 02/12/2018 CLINICAL DATA:  Acute onset of shortness of breath. Expiratory wheezing. EXAM: PORTABLE CHEST 1 VIEW COMPARISON:  Chest radiograph performed 10/23/2014, and CTA of the chest performed 10/24/2014 FINDINGS: A small right pleural effusion is noted, with associated opacity. Increased interstitial markings may reflect mild interstitial edema. Alternatively, right basilar pneumonia could have a similar appearance. No pneumothorax is seen. The cardiomediastinal silhouette is borderline normal in size. No acute osseous abnormalities are identified. IMPRESSION: Small right pleural effusion noted. Increased interstitial markings may reflect mild interstitial edema. Alternatively, right basilar pneumonia could have a similar appearance. Electronically Signed   By: Roanna Raider M.D.   On: 02/12/2018 02:36     CBC No results for input(s): WBC, HGB, HCT, PLT, MCV, MCH, MCHC, RDW, LYMPHSABS, MONOABS, EOSABS, BASOSABS, BANDABS in the last 168 hours.  Invalid input(s): NEUTRABS, BANDSABD  Chemistries  Recent Labs  Lab 02/20/18 0453 02/21/18 0447 02/22/18 0446 02/23/18 0337 02/25/18 0917  NA 138 140 141 140 139  K 4.1 4.2 4.1 4.7 4.8  CL 97* 99 97* 100 100  CO2 34* 35* 37* 33* 32  GLUCOSE 430* 233* 266* 262* 226*  BUN 48* 56* 44* 46* 63*  CREATININE 1.42* 1.38* 1.30* 1.64* 1.78*  CALCIUM 8.1* 8.1* 8.3* 7.7* 8.0*   ------------------------------------------------------------------------------------------------------------------ estimated creatinine clearance is 69.4  mL/min (A) (by C-G formula based on SCr of 1.78 mg/dL (H)). ------------------------------------------------------------------------------------------------------------------ No results for input(s): HGBA1C in the last 72 hours. ------------------------------------------------------------------------------------------------------------------ No results for input(s): CHOL, HDL, LDLCALC, TRIG, CHOLHDL, LDLDIRECT in the last 72 hours. ------------------------------------------------------------------------------------------------------------------ No results for input(s): TSH, T4TOTAL, T3FREE, THYROIDAB in the last 72 hours.  Invalid input(s): FREET3 ------------------------------------------------------------------------------------------------------------------ No results for input(s): VITAMINB12, FOLATE, FERRITIN, TIBC, IRON, RETICCTPCT in the last 72 hours.  Coagulation profile No results for input(s): INR, PROTIME in the last 168 hours.  No results for input(s): DDIMER in the last 72 hours.  Cardiac Enzymes No results for input(s): CKMB, TROPONINI, MYOGLOBIN in the last 168 hours.  Invalid input(s): CK ------------------------------------------------------------------------------------------------------------------ Invalid input(s): POCBNP    Assessment & Plan  Patient is 47 year old noncompliant with his CHF regimen presenting with worsening shortness of breath and swelling   1.  Acute hypoxic respiratory failure severe fluid overload, improved.  Patient is on room air now.  2.Acute on chronic diastolic CHF (congestive heart failure) (HCC) - Improving, reStarted IV Lasix drip per nephrology recommends to continue another 24 to 48 hours.  Watch for hypokalemia, hypomagnesemia while on diuretics .  Slightly worsened renal function today, continue Lasix drip, receiving albumin infusion also. 3.    Accelerated hypertension pressure stable Continue lisinopril add IV hydralazine as  needed blood pressure stable  4  COPD , history of recent exacerbation, received steroids, Omnicef.  Patient blood sugar was elevated when he was on steroids and now steroids are off and blood sugars are on the lower side so I decreased Lantus dose, stop mealtime coverage because of hypoglycemia concern. 5  Diabetes (HCC) -continue sliding scale insulin with coverage, Lantus.  Restart mealtime insulin also at a lower dose.   6  Leg wounds and diabetic foot wound -blistering leg wounds due to his edema, treatment for edema as above,    7.  GERD continue Protonix  moreThan 50% time spent in counseling, coordination of care    Code  Status Orders  (From admission, onward)         Start     Ordered   02/12/18 0339  Full code  Continuous     02/12/18 0338        Code Status History    This patient has a current code status but no historical code status.           Consults cardiology  DVT Prophylaxis  Lovenox  Lab Results  Component Value Date   PLT 332 02/12/2018     Time Spent in minutes   35 minutes spent Katha HammingSnehalatha [place] M.D on 02/25/2018 at 4:25 PM  Between 7am to 6pm - Pager - [phone]  After 6pm go to [email or web address] - Social research officer, governmentpassword [email or web address]  [employer]   Office  [phone]

## 2018-02-25 NOTE — Progress Notes (Signed)
[facility] [place], [place] 02/25/18  Subjective:  Patient seen at bedside. Continues to have excellent urine output at the moment. The patient's weight is down a little over 3 kg.   Objective:  Vital signs in last 24 hours:  Temp:  [97.4 F (36.3 C)-98.7 F (37.1 C)] 97.4 F (36.3 C) (08/22 0818) Pulse Rate:  [80-93] 93 (08/22 0818) Resp:  [14-18] 14 (08/22 0818) BP: (96-146)/(49-75) 119/64 (08/22 0818) SpO2:  [91 %-97 %] 96 % (08/22 0818) Weight:  [121.2 kg] 121.2 kg (08/22 0406)  Weight change: -3.583 kg Filed Weights   02/23/18 0426 02/24/18 0436 02/25/18 0406  Weight: 121.9 kg 124.8 kg 121.2 kg    Intake/Output:    Intake/Output Summary (Last 24 hours) at 02/25/2018 1020 Last data filed at 02/25/2018 0600 Gross per 24 hour  Intake 927.38 ml  Output 4800 ml  Net -3872.62 ml    Physical Exam: General:  No acute distress, laying in the bed  HEENT  anicteric, moist oral mucous membranes  Neck:  Supple  Lungs:  bibasilar crackles, normal effort  Heart::  No rub, regular rhythm  Abdomen:  Soft, nontender  Extremities:  2+ LE edema with erythema  Neurologic:  Awake, alert, follows ocmmands  Skin:  Congestive hyperemia in LE's         Basic Metabolic Panel:  Recent Labs  Lab 02/20/18 0453 02/21/18 0447 02/22/18 0446 02/23/18 0337 02/25/18 0917  NA 138 140 141 140 139  K 4.1 4.2 4.1 4.7 4.8  CL 97* 99 97* 100 100  CO2 34* 35* 37* 33* 32  GLUCOSE 430* 233* 266* 262* 226*  BUN 48* 56* 44* 46* 63*  CREATININE 1.42* 1.38* 1.30* 1.64* 1.78*  CALCIUM 8.1* 8.1* 8.3* 7.7* 8.0*     CBC: No results for input(s): WBC, NEUTROABS, HGB, HCT, MCV, PLT in the last 168 hours.   No results found for: HEPBSAG, HEPBSAB, HEPBIGM    Microbiology:  Recent Results (from the past 240 hour(s))  C difficile quick scan w PCR reflex     Status: None   Collection Time: 02/22/18  3:33 AM  Result Value Ref Range Status   C Diff antigen NEGATIVE  NEGATIVE Final   C Diff toxin NEGATIVE NEGATIVE Final   C Diff interpretation No C. difficile detected.  Final    Comment: Performed at [facility], [address] [place], [place] [id number]    Coagulation Studies: No results for input(s): LABPROT, INR in the last 72 hours.  Urinalysis: No results for input(s): COLORURINE, LABSPEC, PHURINE, GLUCOSEU, HGBUR, BILIRUBINUR, KETONESUR, PROTEINUR, UROBILINOGEN, NITRITE, LEUKOCYTESUR in the last 72 hours.  Invalid input(s): APPERANCEUR    Imaging: No results found.   Medications:   . sodium chloride 500 mL (02/15/18 0047)  . furosemide (LASIX) infusion 6 mg/hr (02/23/18 1522)   . budesonide (PULMICORT) nebulizer solution  0.5 mg Nebulization BID  . chlorhexidine  15 mL Mouth Rinse BID  . collagenase   Topical Daily  . enoxaparin (LOVENOX) injection  40 mg Subcutaneous Q24H  . gabapentin  800 mg Oral TID  . insulin aspart  0-20 Units Subcutaneous TID WC  . insulin aspart  0-5 Units Subcutaneous QHS  . insulin glargine  30 Units Subcutaneous QHS  . ipratropium-albuterol  3 mL Nebulization BID  . lidocaine  2 patch Transdermal Q24H  . lisinopril  10 mg Oral Daily  . mouth rinse  15 mL Mouth Rinse BID  . Melatonin  5 mg  Oral QHS  . pantoprazole  40 mg Oral BID  . zolpidem  10 mg Oral QHS   sodium chloride, acetaminophen **OR** acetaminophen, albuterol, hydrALAZINE, ondansetron **OR** ondansetron (ZOFRAN) IV, oxyCODONE  Assessment/ Plan:  47 y.o. male  with HTN, dCHF, Diabetes with complications (neuropathy), history of positive ANA April 2019,  was admitted on 02/12/2018 with massive volume overload  1.  Acute kidney injury.  Baseline creatinine 1.13 from May/27/2019/GFR > 60 2.  Generalized edmea 3.  Hypoalbuminemia 4.  Diabetes mellitus type with CKD.  5.  Hyperkalemia  Plan: Creatinine is a bit higher this a.m. at 1.7 but this is likely as a result of her Lasix use.  Patient still has significant volume  overload.  Continue Lasix drip for now given ongoing volume overload.  Insulin management as per hospitalist.  We will also add albumin 25 g IV every 8 hours for enhanced diuresis.   LOS: 12 John Lawson 8/22/201910:20 AM  [facility] [address] [place], [place] [phone]  Note: This note was prepared with Dragon dictation. Any transcription errors are unintentional

## 2018-02-25 NOTE — Progress Notes (Signed)
Patient's wife has already changed the patient's R foot wound dressing overnight (around 1A) d/t it being wet / soiled. I just applied the 4 inch ACE wrap to patient's BLE, as ordered per shift. Reapplied socks. Patient tolerated well. Will continue to monitor wound dressing status. Jari FavreSteven M [facility]

## 2018-02-25 NOTE — Plan of Care (Signed)
  Problem: Clinical Measurements: Goal: Ability to maintain clinical measurements within normal limits will improve Outcome: Not Progressing Note:  Patient's creatinine level today is elevated at 1.8. Baseline level is apparently 1.1. Will continue to monitor renal function values. John FavreSteven M [facility]

## 2018-02-26 LAB — GLUCOSE, CAPILLARY
GLUCOSE-CAPILLARY: 314 mg/dL — AB (ref 70–99)
GLUCOSE-CAPILLARY: 173 mg/dL — AB (ref 70–99)
Glucose-Capillary: 159 mg/dL — ABNORMAL HIGH (ref 70–99)
Glucose-Capillary: 92 mg/dL (ref 70–99)

## 2018-02-26 LAB — CREATININE, SERUM
CREATININE: 1.97 mg/dL — AB (ref 0.61–1.24)
GFR calc Af Amer: 44 mL/min — ABNORMAL LOW (ref >60)
GFR, EST NON AFRICAN AMERICAN: 38 mL/min — AB (ref >60)

## 2018-02-26 MED ORDER — INSULIN GLARGINE 100 UNIT/ML ~~LOC~~ SOLN
32.0000 [IU] | Freq: Every day | SUBCUTANEOUS | Status: DC
  Administered 2018-02-26: 32 [IU] via SUBCUTANEOUS
  Filled 2018-02-26 (×2): qty 0.32

## 2018-02-26 MED ORDER — INSULIN ASPART 100 UNIT/ML ~~LOC~~ SOLN
7.0000 [IU] | Freq: Three times a day (TID) | SUBCUTANEOUS | Status: DC
  Administered 2018-02-27: 7 [IU] via SUBCUTANEOUS
  Filled 2018-02-26 (×2): qty 1

## 2018-02-26 MED ORDER — ENOXAPARIN SODIUM 40 MG/0.4ML ~~LOC~~ SOLN
40.0000 mg | SUBCUTANEOUS | Status: DC
  Administered 2018-02-26: 40 mg via SUBCUTANEOUS
  Filled 2018-02-26: qty 0.4

## 2018-02-26 NOTE — Care Management (Signed)
Met again with patient. He still does not have ex-wife's address. Ex wife is not listed in contacts either. Patient aware that RNCM will need this address to set up home health and he agrees.  Plan will be with [facility] at home with ReDS vest.

## 2018-02-26 NOTE — Progress Notes (Signed)
(   02/25/18) 1900 pt been refusing bed alarm even with a history of fall. Pt was educated about safety and advise to call the nurse or the tech when using bathroom. Will continue to monitor.

## 2018-02-26 NOTE — Plan of Care (Signed)
  Problem: Education: Goal: Knowledge of General Education information will improve Description: Including pain rating scale, medication(s)/side effects and non-pharmacologic comfort measures Outcome: Progressing   Problem: Health Behavior/Discharge Planning: Goal: Ability to manage health-related needs will improve Outcome: Progressing   Problem: Clinical Measurements: Goal: Ability to maintain clinical measurements within normal limits will improve Outcome: Progressing Goal: Will remain free from infection Outcome: Progressing Goal: Diagnostic test results will improve Outcome: Progressing Goal: Respiratory complications will improve Outcome: Progressing Goal: Cardiovascular complication will be avoided Outcome: Progressing   Problem: Activity: Goal: Risk for activity intolerance will decrease Outcome: Progressing   Problem: Nutrition: Goal: Adequate nutrition will be maintained Outcome: Progressing   Problem: Elimination: Goal: Will not experience complications related to bowel motility Outcome: Progressing Goal: Will not experience complications related to urinary retention Outcome: Progressing   Problem: Pain Managment: Goal: General experience of comfort will improve Outcome: Progressing   Problem: Safety: Goal: Ability to remain free from injury will improve Outcome: Progressing   Problem: Skin Integrity: Goal: Risk for impaired skin integrity will decrease Outcome: Progressing   Problem: Education: Goal: Ability to demonstrate management of disease process will improve Outcome: Progressing Goal: Ability to verbalize understanding of medication therapies will improve Outcome: Progressing Goal: Individualized Educational Video(s) Outcome: Progressing   Problem: Activity: Goal: Capacity to carry out activities will improve Outcome: Progressing   Problem: Cardiac: Goal: Ability to achieve and maintain adequate cardiopulmonary perfusion will  improve Outcome: Progressing   

## 2018-02-26 NOTE — Progress Notes (Signed)
[facility] [place] 02/26/18  Subjective:  BUN up to 63 with a creatinine 1.97. Case discussed with hospitalist. We decided to discontinue Lasix drip now. Lower extremities wrapped. Patient feeling a bit better as compared to admission.   Objective:  Vital signs in last 24 hours:  Temp:  [97.5 F (36.4 C)-98.7 F (37.1 C)] 98.6 F (37 C) (08/23 1637) Pulse Rate:  [79-88] 79 (08/23 1637) Resp:  [17-18] 18 (08/23 1637) BP: (111-147)/(64-77) 111/64 (08/23 1637) SpO2:  [90 %-100 %] 96 % (08/23 1637) Weight:  [121.5 kg] 121.5 kg (08/23 0535)  Weight change: 0.272 kg Filed Weights   02/24/18 0436 02/25/18 0406 02/26/18 0535  Weight: 124.8 kg 121.2 kg 121.5 kg    Intake/Output:    Intake/Output Summary (Last 24 hours) at 02/26/2018 1701 Last data filed at 02/26/2018 1700 Gross per 24 hour  Intake 1022.53 ml  Output 1900 ml  Net -877.47 ml    Physical Exam: General:  No acute distress, laying in the bed  HEENT  anicteric, moist oral mucous membranes  Neck:  Supple  Lungs:  bibasilar crackles, normal effort  Heart::  No rub, regular rhythm  Abdomen:  Soft, nontender  Extremities:  2+ LE edema, b/l LE's wrapped  Neurologic:  Awake, alert, follows ocmmands  Skin:  Congestive hyperemia in LE's         Basic Metabolic Panel:  Recent Labs  Lab 02/20/18 0453 02/21/18 0447 02/22/18 0446 02/23/18 0337 02/25/18 0917 02/26/18 0504  NA 138 140 141 140 139  --   K 4.1 4.2 4.1 4.7 4.8  --   CL 97* 99 97* 100 100  --   CO2 34* 35* 37* 33* 32  --   GLUCOSE 430* 233* 266* 262* 226*  --   BUN 48* 56* 44* 46* 63*  --   CREATININE 1.42* 1.38* 1.30* 1.64* 1.78* 1.97*  CALCIUM 8.1* 8.1* 8.3* 7.7* 8.0*  --      CBC: No results for input(s): WBC, NEUTROABS, HGB, HCT, MCV, PLT in the last 168 hours.   No results found for: HEPBSAG, HEPBSAB, HEPBIGM    Microbiology:  Recent Results (from the past 240 hour(s))  C difficile quick scan w PCR  reflex     Status: None   Collection Time: 02/22/18  3:33 AM  Result Value Ref Range Status   C Diff antigen NEGATIVE NEGATIVE Final   C Diff toxin NEGATIVE NEGATIVE Final   C Diff interpretation No C. difficile detected.  Final    Comment: Performed at [facility], [address] [place], [place] [address]    Coagulation Studies: No results for input(s): LABPROT, INR in the last 72 hours.  Urinalysis: No results for input(s): COLORURINE, LABSPEC, PHURINE, GLUCOSEU, HGBUR, BILIRUBINUR, KETONESUR, PROTEINUR, UROBILINOGEN, NITRITE, LEUKOCYTESUR in the last 72 hours.  Invalid input(s): APPERANCEUR    Imaging: No results found.   Medications:   . sodium chloride 500 mL (02/15/18 0047)  . albumin human Stopped (02/26/18 0936)   . budesonide (PULMICORT) nebulizer solution  0.5 mg Nebulization BID  . chlorhexidine  15 mL Mouth Rinse BID  . collagenase   Topical Daily  . enoxaparin (LOVENOX) injection  40 mg Subcutaneous Q24H  . gabapentin  800 mg Oral TID  . insulin aspart  0-20 Units Subcutaneous TID WC  . insulin aspart  0-5 Units Subcutaneous QHS  . insulin aspart  7 Units Subcutaneous TID WC  . insulin glargine  32 Units Subcutaneous  QHS  . ipratropium-albuterol  3 mL Nebulization BID  . lidocaine  2 patch Transdermal Q24H  . lisinopril  10 mg Oral Daily  . mouth rinse  15 mL Mouth Rinse BID  . Melatonin  5 mg Oral QHS  . pantoprazole  40 mg Oral BID  . zolpidem  10 mg Oral QHS   sodium chloride, acetaminophen **OR** acetaminophen, albuterol, hydrALAZINE, ondansetron **OR** ondansetron (ZOFRAN) IV, oxyCODONE  Assessment/ Plan:  47 y.o. male  with HTN, dCHF, Diabetes with complications (neuropathy), history of positive ANA April 2019,  was admitted on 02/12/2018 with massive volume overload  1.  Acute kidney injury.  Baseline creatinine 1.13 from May/27/2019/GFR > 60 2.  Generalized edmea 3.  Hypoalbuminemia 4.  Diabetes mellitus type with CKD.  5.   Hyperkalemia  Plan: The patient's renal function has worsened with Lasix drip.  We will go ahead and discontinue Lasix drip and place the patient back on torsemide tomorrow.  Continue to monitor renal parameters daily.  Plan discontinue albumin as well.  Will monitor.    LOS: 13 Anetta Olvera 8/23/20195:01 PM  [facility] [address] [place], [place] [phone]  Note: This note was prepared with Dragon dictation. Any transcription errors are unintentional

## 2018-02-26 NOTE — Plan of Care (Signed)
  Problem: Education: Goal: Knowledge of General Education information will improve Description Including pain rating scale, medication(s)/side effects and non-pharmacologic comfort measures Outcome: Progressing   Problem: Pain Managment: Goal: General experience of comfort will improve Outcome: Progressing   Problem: Safety: Goal: Ability to remain free from injury will improve Outcome: Progressing   Problem: Education: Goal: Ability to demonstrate management of disease process will improve Outcome: Progressing   Problem: Activity: Goal: Capacity to carry out activities will improve Outcome: Progressing

## 2018-02-26 NOTE — Progress Notes (Signed)
Physical Therapy Treatment Patient Details Name: John Lawson MRN: [id number] DOB: 11/21/1970 Today's Date: 02/26/2018    History of Present Illness John Lawson is a 47 y.o. male brought to the ED from home via EMS with a chief complaint of shortness of breath.  Patient has a history of hypertension, diabetes, CHF who ran out of his Lasix 1 week ago.  Presents with a 50 pound weight gain over the past week, diffuse swelling and shortness of breath with wheezing.  Also complains of chest tightness. PMH of L toe amputation. R foot open ulcers.     PT Comments    Pt is progressing towards goals today, and is less abrasive with PT, but still requires heavy encouragement to participate in treatment. Pt refuses use of B orthopedic boots, RW, and bed alarm. Pt willing to use gait belt after heavy encouragement. Pt bed mobility is still roughly same as last treatment as well as transfer sit<>stand, see below for mobility details. Pt was able to ambulate in room with no assistive device and PT CGA to supervision per pt preference. Pt was unsafe with ambulation and repeatedly reached out for furniture and leaned against wall once to rest. Pt unwilling to take PT advice for safety with ambulation and leaning on wall. Pt was able to don clothing with PT supervision seated in chair with increased time. Pt was able to search in cabinets below waist height with PT CGA to supervision and 1 UE assist. Current d/c recommendation remains appropriate at this time.  Follow Up Recommendations  Home health PT     Equipment Recommendations  Rolling walker with 5" wheels    Recommendations for Other Services       Precautions / Restrictions Precautions Precautions: Fall Restrictions Weight Bearing Restrictions: No Other Position/Activity Restrictions:  B toe amputation    Mobility  Bed Mobility Overal bed mobility: Modified Independent Bed Mobility: Supine to Sit;Sit to Supine     Supine to sit: HOB  elevated;Supervision Sit to supine: HOB elevated;Supervision   General bed mobility comments: Pt required supervision 2/2 to impulsivity  Transfers Overall transfer level: Needs assistance Equipment used: Rolling walker (2 wheeled) Transfers: Sit to/from Stand Sit to Stand: Supervision         General transfer comment: Pt did not elevate bed to stand, but still requires supervision 2/2 to unsteadiness and impulsiveness. Pt did require multiple attempts to stand.  Ambulation/Gait Ambulation/Gait assistance: Min guard Gait Distance (Feet): 20 Feet Assistive device: None Gait Pattern/deviations: Step-through pattern     General Gait Details: pt able to ambulte 20 feet in room with supervision and no assistive device. Pt was reaching for furniture and leaned an wall once. PT advised unsafe to ambulate without RW and lean on wall, but pt refused.  Pt denies any dizziness or lightheadedness with ambulation. Pt has increased BOS and limited hip/knee flexion.    Stairs             Wheelchair Mobility    Modified Rankin (Stroke Patients Only)       Balance Overall balance assessment: Needs assistance Sitting-balance support: No upper extremity supported;Feet supported Sitting balance-Leahy Scale: Good     Standing balance support: No upper extremity supported Standing balance-Leahy Scale: Fair   Single Leg Stance - Right Leg: (with 1 UE support) Single Leg Stance - Left Leg: (with 1 UE support)         High level balance activites: Side stepping;Backward walking;Other (comment)(bending and reaching in cabinets.) High  Level Balance Comments: Pt able to side step/backwards walk/flex trunk and reach into low cabinets all with 1 UE support to no UE support            Cognition Arousal/Alertness: Awake/alert Behavior During Therapy: Agitated;Impulsive Overall Cognitive Status: Within Functional Limits for tasks assessed                                  General Comments: pt was difficult to understand 2/2 to slurred and quiet speech.       Exercises Other Exercises Other Exercises: Pt able to side step/backwards walk/flex trunk and reach into low cabinets all with 1 UE support to no UE support. PT provided supervison to CGA for safety.    General Comments        Pertinent Vitals/Pain Pain Assessment: Faces Faces Pain Scale: Hurts a little bit Pain Location: unclear Pain Descriptors / Indicators: Grimacing Pain Intervention(s): Limited activity within patient's tolerance;Monitored during session;Other (comment)(pt was picking upper lip with tweasers when PT entered room)    Home Living                      Prior Function            PT Goals (current goals can now be found in the care plan section) Acute Rehab PT Goals Patient Stated Goal: To go home PT Goal Formulation: With patient Time For Goal Achievement: 03/12/18 Potential to Achieve Goals: Good Progress towards PT goals: Progressing toward goals    Frequency    Min 2X/week      PT Plan Current plan remains appropriate    Co-evaluation              AM-PAC PT "6 Clicks" Daily Activity  Outcome Measure  Difficulty turning over in bed (including adjusting bedclothes, sheets and blankets)?: None Difficulty moving from lying on back to sitting on the side of the bed? : None Difficulty sitting down on and standing up from a chair with arms (e.g., wheelchair, bedside commode, etc,.)?: Unable Help needed moving to and from a bed to chair (including a wheelchair)?: A Little Help needed walking in hospital room?: A Little Help needed climbing 3-5 steps with a railing? : A Little 6 Click Score: 18    End of Session Equipment Utilized During Treatment: Gait belt(pt required heavy encouragment for use of GB) Activity Tolerance: Patient tolerated treatment well;Treatment limited secondary to agitation(pt unwilling to use RW) Patient left: in  bed;with call bell/phone within reach(pt refuses bed alarm, and was off when PT entered room) Nurse Communication: Mobility status;Other (comment)(impulsive behavior) PT Visit Diagnosis: Unsteadiness on feet (R26.81);Pain;Difficulty in walking, not elsewhere classified (R26.2);Other symptoms and signs involving the nervous system (R29.898) Pain - Right/Left: (unclear) Pain - part of body: (unclear)     Time: [id number] PT Time Calculation (min) (ACUTE ONLY): 14 min  Charges:                        John Lawson, SPT    John HarrisonStephen Shatoya Lawson 02/26/2018, 4:31 PM

## 2018-02-26 NOTE — Progress Notes (Signed)
[employer] - [place] at [facility]                                                                                                                                                                                  Patient Demographics   John Lawson, is a 47 y.o. male, DOB - 1970/11/11, ZOX:[id number]  Admit date - 02/12/2018   Admitting Physician Oralia Manis, MD  Outpatient Primary MD for the patient is [facility]   LOS - 13  Denies any shortness of breath.  No chest pain, creatinine is slightly up at 1.97.  Nephrologist recommended to discontinue Lasix drip, to start torsemide tomorrow.  Same explained to him. Review of Systems:   CONSTITUTIONAL: No documented fever. No fatigue, weakness. No weight gain, no weight loss.  EYES: No blurry or double vision.  ENT: No tinnitus. No postnasal drip. No redness of the oropharynx.  RESPIRATORY: No cough, no wheeze, no hemoptysis.  Positive dyspnea.  CARDIOVASCULAR: No chest pain. No orthopnea. No palpitations. No syncope.  Positive edema GASTROINTESTINAL: No nausea, no vomiting or diarrhea. No abdominal pain. No melena or hematochezia.  GENITOURINARY: No dysuria or hematuria.  ENDOCRINE: No polyuria or nocturia. No heat or cold intolerance.  HEMATOLOGY: No anemia. No bruising. No bleeding.  INTEGUMENTARY: No rashes. No lesions.  MUSCULOSKELETAL: No arthritis. No swelling. No gout.  NEUROLOGIC: No numbness, tingling, or ataxia. No seizure-type activity.  PSYCHIATRIC: No anxiety. No insomnia. No ADD.    Vitals:   Vitals:   02/26/18 0535 02/26/18 0617 02/26/18 0726 02/26/18 0745  BP: 130/72 122/77  135/69  Pulse: 80 80  86  Resp: 17   18  Temp: 98.4 F (36.9 C) 97.6 F (36.4 C)  (!) 97.5 F (36.4 C)  TempSrc: Oral Oral  Oral  SpO2: 95% 96% 100% 97%  Weight: 121.5 kg     Height:        Wt Readings from Last 3 Encounters:  02/26/18 121.5 kg     Intake/Output Summary (Last 24 hours) at  02/26/2018 1308 Last data filed at 02/26/2018 1240 Gross per 24 hour  Intake 1013.26 ml  Output 1000 ml  Net 13.26 ml    Physical Exam:   GENERAL: Accessory muscle usage HEAD, EYES, EARS, NOSE AND THROAT: Atraumatic, normocephalic. Extraocular muscles are intact. Pupils equal and reactive to light. Sclerae anicteric. No conjunctival injection. No oro-pharyngeal erythema.  NECK: Supple. There is no jugular venous distention. No bruits, no lymphadenopathy, no thyromegaly.  HEART: Regular rate and rhythm,. No murmurs, no rubs, no clicks.  LUNGS: Clear to auscultation. Has good bowel sounds. No hepatosplenomegaly appreciated.  EXTREMITIES: Positive edema in  both lower extremity, better than before. NEUROLOGIC: Drowsy SKIN: Moist and warm with no rashes appreciated.  Scrotal swelling significant Psych: Not anxious, depressed LN: No inguinal LN enlargement    Antibiotics   Anti-infectives (From admission, onward)   Start     Dose/Rate Route Frequency Ordered Stop   02/17/18 1000  cefdinir (OMNICEF) capsule 300 mg  Status:  Discontinued     300 mg Oral Every 12 hours 02/16/18 1259 02/22/18 1403   02/13/18 1200  cefTRIAXone (ROCEPHIN) 1 g in sodium chloride 0.9 % 100 mL IVPB  Status:  Discontinued     1 g 200 mL/hr over 30 Minutes Intravenous Daily 02/13/18 1125 02/16/18 1259   02/13/18 1200  azithromycin (ZITHROMAX) tablet 500 mg  Status:  Discontinued     500 mg Oral Daily 02/13/18 1125 02/16/18 1259      Medications   Scheduled Meds: . budesonide (PULMICORT) nebulizer solution  0.5 mg Nebulization BID  . chlorhexidine  15 mL Mouth Rinse BID  . collagenase   Topical Daily  . enoxaparin (LOVENOX) injection  40 mg Subcutaneous Q24H  . gabapentin  800 mg Oral TID  . insulin aspart  0-20 Units Subcutaneous TID WC  . insulin aspart  0-5 Units Subcutaneous QHS  . insulin aspart  5 Units Subcutaneous TID WC  . insulin glargine  30 Units Subcutaneous QHS  . ipratropium-albuterol  3 mL  Nebulization BID  . lidocaine  2 patch Transdermal Q24H  . lisinopril  10 mg Oral Daily  . mouth rinse  15 mL Mouth Rinse BID  . Melatonin  5 mg Oral QHS  . pantoprazole  40 mg Oral BID  . zolpidem  10 mg Oral QHS   Continuous Infusions: . sodium chloride 500 mL (02/15/18 0047)  . albumin human Stopped (02/26/18 0936)   PRN Meds:.sodium chloride, acetaminophen **OR** acetaminophen, albuterol, hydrALAZINE, ondansetron **OR** ondansetron (ZOFRAN) IV, oxyCODONE   Data Review:   Micro Results Recent Results (from the past 240 hour(s))  C difficile quick scan w PCR reflex     Status: None   Collection Time: 02/22/18  3:33 AM  Result Value Ref Range Status   C Diff antigen NEGATIVE NEGATIVE Final   C Diff toxin NEGATIVE NEGATIVE Final   C Diff interpretation No C. difficile detected.  Final    Comment: Performed at [facility], [address], [place], [place] [address]    Radiology Reports US Renal  Result Date: 02/13/2018 CLINICAL DATA:  Acute kidney insufficiency EXAM: RENAL / URINARY TRACT ULTRASOUND COMPLETE COMPARISON:  None. FINDINGS: Right Kidney: Length: 12.9 cm. There is diffuse increased echotexture of the right kidney. No mass or hydronephrosis visualized. Left Kidney: Length: 14.5 cm. Echogenicity within normal limits. No mass or hydronephrosis visualized. Bladder: Evaluation of bladder is limited as patient has Foley catheter in place. IMPRESSION: Diffuse increased echotexture of the right kidney. This is nonspecific but can be seen in medical renal disease. No hydronephrosis identified bilaterally. Electronically Signed   By: Sherian Rein M.D.   On: 02/13/2018 16:36   Dg Chest Port 1 View  Result Date: 02/14/2018 CLINICAL DATA:  Atelectasis EXAM: PORTABLE CHEST 1 VIEW COMPARISON:  02/12/2018 FINDINGS: Increased interstitial markings with a perihilar distribution, increased, favoring mild interstitial edema. Small right pleural effusion. Associated right basilar  opacity, likely atelectasis, unchanged. No pneumothorax. Cardiomegaly. IMPRESSION: Cardiomegaly with mild interstitial edema, increased. Small right pleural effusion. Associated right lower lobe opacity, likely atelectasis, unchanged. Electronically Signed   By:  Charline Bills M.D.   On: 02/14/2018 07:16   Dg Chest Port 1 View  Result Date: 02/12/2018 CLINICAL DATA:  Acute onset of shortness of breath. Expiratory wheezing. EXAM: PORTABLE CHEST 1 VIEW COMPARISON:  Chest radiograph performed 10/23/2014, and CTA of the chest performed 10/24/2014 FINDINGS: A small right pleural effusion is noted, with associated opacity. Increased interstitial markings may reflect mild interstitial edema. Alternatively, right basilar pneumonia could have a similar appearance. No pneumothorax is seen. The cardiomediastinal silhouette is borderline normal in size. No acute osseous abnormalities are identified. IMPRESSION: Small right pleural effusion noted. Increased interstitial markings may reflect mild interstitial edema. Alternatively, right basilar pneumonia could have a similar appearance. Electronically Signed   By: Roanna Raider M.D.   On: 02/12/2018 02:36     CBC No results for input(s): WBC, HGB, HCT, PLT, MCV, MCH, MCHC, RDW, LYMPHSABS, MONOABS, EOSABS, BASOSABS, BANDABS in the last 168 hours.  Invalid input(s): NEUTRABS, BANDSABD  Chemistries  Recent Labs  Lab 02/20/18 0453 02/21/18 0447 02/22/18 0446 02/23/18 0337 02/25/18 0917 02/26/18 0504  NA 138 140 141 140 139  --   K 4.1 4.2 4.1 4.7 4.8  --   CL 97* 99 97* 100 100  --   CO2 34* 35* 37* 33* 32  --   GLUCOSE 430* 233* 266* 262* 226*  --   BUN 48* 56* 44* 46* 63*  --   CREATININE 1.42* 1.38* 1.30* 1.64* 1.78* 1.97*  CALCIUM 8.1* 8.1* 8.3* 7.7* 8.0*  --    ------------------------------------------------------------------------------------------------------------------ estimated creatinine clearance is 62.8 mL/min (A) (by C-G formula based  on SCr of 1.97 mg/dL (H)). ------------------------------------------------------------------------------------------------------------------ No results for input(s): HGBA1C in the last 72 hours. ------------------------------------------------------------------------------------------------------------------ No results for input(s): CHOL, HDL, LDLCALC, TRIG, CHOLHDL, LDLDIRECT in the last 72 hours. ------------------------------------------------------------------------------------------------------------------ No results for input(s): TSH, T4TOTAL, T3FREE, THYROIDAB in the last 72 hours.  Invalid input(s): FREET3 ------------------------------------------------------------------------------------------------------------------ No results for input(s): VITAMINB12, FOLATE, FERRITIN, TIBC, IRON, RETICCTPCT in the last 72 hours.  Coagulation profile No results for input(s): INR, PROTIME in the last 168 hours.  No results for input(s): DDIMER in the last 72 hours.  Cardiac Enzymes No results for input(s): CKMB, TROPONINI, MYOGLOBIN in the last 168 hours.  Invalid input(s): CK ------------------------------------------------------------------------------------------------------------------ Invalid input(s): POCBNP    Assessment & Plan  Patient is 47 year old noncompliant with his CHF regimen presenting with worsening shortness of breath and swelling   1.  Acute hypoxic respiratory failure severe fluid overload, improved.  Patient is on room air now.  2.Acute on chronic diastolic CHF (congestive heart failure) (HCC) - Improving, r discontinue Lasix drip as per nephrology recommendation, start torsemide 40 mg p.o. twice daily tomorrow, likely discharge home tomorrow, told the patient about that.   3.    Accelerated hypertension pressure stable Continue lisinopril add IV hydralazine as needed blood pressure stable  4  COPD , history of recent exacerbation, finished antibiotics,  steroids. 5  Diabetes (HCC) -continue sliding scale insulin with coverage, Lantus.  Adjust dose of Lantus, mealtime insulin .   6  Leg wounds and diabetic foot wound -blistering leg wounds due to his edema, treatment for edema as above,    7.  GERD continue Protonix  moreThan 50% time spent in counseling, coordination of care    Code Status Orders  (From admission, onward)         Start     Ordered   02/12/18 0339  Full code  Continuous     02/12/18  [id number]        Code Status History    This patient has a current code status but no historical code status.           Consults cardiology,nephrology  DVT Prophylaxis  Lovenox  Lab Results  Component Value Date   PLT 332 02/12/2018     Time Spent in minutes   35 minutes spent Katha HammingSnehalatha Deamonte Sayegh M.D on 02/26/2018 at 1:08 PM  Between 7am to 6pm - Pager - [phone]  After 6pm go to [email or web address] - Social research officer, governmentpassword [email or web address]  [employer]   Office  [phone]

## 2018-02-27 LAB — MAGNESIUM: MAGNESIUM: 2.4 mg/dL (ref 1.7–2.4)

## 2018-02-27 LAB — BASIC METABOLIC PANEL
Anion gap: 7 (ref 5–15)
BUN: 62 mg/dL — ABNORMAL HIGH (ref 6–20)
CO2: 32 mmol/L (ref 22–32)
CREATININE: 1.51 mg/dL — AB (ref 0.61–1.24)
Calcium: 8.8 mg/dL — ABNORMAL LOW (ref 8.9–10.3)
Chloride: 100 mmol/L (ref 98–111)
GFR calc non Af Amer: 53 mL/min — ABNORMAL LOW (ref >60)
Glucose, Bld: 302 mg/dL — ABNORMAL HIGH (ref 70–99)
Potassium: 5 mmol/L (ref 3.5–5.1)
SODIUM: 139 mmol/L (ref 135–145)

## 2018-02-27 LAB — GLUCOSE, CAPILLARY
GLUCOSE-CAPILLARY: 325 mg/dL — AB (ref 70–99)
Glucose-Capillary: 99 mg/dL (ref 70–99)

## 2018-02-27 MED ORDER — TORSEMIDE 20 MG PO TABS
40.0000 mg | ORAL_TABLET | Freq: Two times a day (BID) | ORAL | Status: DC
  Administered 2018-02-27: 40 mg via ORAL
  Filled 2018-02-27: qty 2

## 2018-02-27 MED ORDER — METOPROLOL TARTRATE 25 MG PO TABS: 25.0000 mg | ORAL_TABLET | Freq: Two times a day (BID) | ORAL | 0 refills | Status: AC

## 2018-02-27 MED ORDER — LIDOCAINE 5 % EX PTCH: 2.0000 | MEDICATED_PATCH | CUTANEOUS | 0 refills | Status: AC

## 2018-02-27 MED ORDER — INSULIN ASPART 100 UNIT/ML ~~LOC~~ SOLN: 0.0000 [IU] | Freq: Three times a day (TID) | SUBCUTANEOUS | 0 refills | Status: DC

## 2018-02-27 MED ORDER — TORSEMIDE 20 MG PO TABS: 40.0000 mg | ORAL_TABLET | Freq: Two times a day (BID) | ORAL | 0 refills | Status: DC

## 2018-02-27 MED ORDER — IPRATROPIUM-ALBUTEROL 0.5-2.5 (3) MG/3ML IN SOLN: 3.0000 mL | RESPIRATORY_TRACT | 0 refills | Status: AC | PRN

## 2018-02-27 NOTE — Care Management Note (Signed)
Case Management Note  Patient Details  Name: John RiggsMichael Lawson MRN: [id number] Date of Birth: 12/11/1970  Subjective/Objective:   Patient to be discharged per MD order. Orders in place for home health services. Previous RNCM had began workup for services via [facility]. Patient agrees with this plan but is only interested in the CHF protocol and prefers no PT or other services. Patient denies need for a walker or any other DME. Family to provide transport home. Patient going to ex wife's home-provided [facility] with that address. Referral confirmed with Rosey Batheresa from [facility] and faxed signed CHF protocol from MD.  Buddy DutyJosh Nicoles Sedlacek RN BSN RNCM [phone]                 Action/Plan:   Expected Discharge Date:  02/27/18               Expected Discharge Plan:  Home w Home Health Services  In-House Referral:     Discharge planning Services  HF Clinic, CM Consult  Post Acute Care Choice:  Home Health Choice offered to:  Patient  DME Arranged:    DME Agency:     HH Arranged:  RN, Disease Management HH Agency:  [facility] (formerly [facility])  Status of Service:  Completed, signed off  If discussed at [employer] of [employer] Meetings, dates discussed:    Additional Comments:  Virgel ManifoldJosh A Nayelie Gionfriddo, RN 02/27/2018, 9:38 AM

## 2018-02-27 NOTE — Discharge Summary (Signed)
[employer] - [place] at [facility]   PATIENT NAME: John Lawson    MR#:  [id number]  DATE OF BIRTH:  07-26-1970  DATE OF ADMISSION:  02/12/2018 ADMITTING PHYSICIAN: Oralia Manis, MD  DATE OF DISCHARGE: 02/27/2018  2:20 PM  PRIMARY CARE PHYSICIAN: [facility]   ADMISSION DIAGNOSIS:  SOB (shortness of breath) [R06.02] Essential hypertension [I10] Acute on chronic congestive heart failure, unspecified heart failure type (HCC) [I50.9]  DISCHARGE DIAGNOSIS:  Principal Problem:   Acute on chronic diastolic CHF (congestive heart failure) (HCC) Active Problems:   Uncontrolled hypertension   Diabetes (HCC)   CHF (congestive heart failure) (HCC)   SECONDARY DIAGNOSIS:   Past Medical History:  Diagnosis Date  . Chronic diastolic CHF (congestive heart failure) (HCC)   . Diabetes mellitus without complication (HCC)   . Hypertension      ADMITTING HISTORY John Lawson  is a 47 y.o. male who presents with chief complaint as above.  Patient ran out of his Lasix, and has not been able to take any for the last 5 or 6 days.  Patient has had 3 to 4 days of increasing lower extremity edema with 1 to 2 days of increasing shortness of breath.  He has been told that he has congestive heart failure in the past, and has been hospitalized for similar symptoms, but is never been followed by cardiology.  Tonight his BNP is mildly elevated at 151, but he has some pulmonary edema, uncontrolled hypertension, significant lower extremity edema, all consistent with exacerbation of heart failure.  Prior echocardiogram reports seen on chart review seem to indicate that the patient in the past has had a decent ejection fraction, but perhaps some diastolic dysfunction.  Hospitalist were called for admission  HOSPITAL COURSE:  Patient was admitted to telemetry.  He was aggressively diuresed with IV Lasix drip during the stay in the hospital and blood pressure was controlled  with hypertensive medications.  Patient was seen and followed up by nephrology during the stay in the hospital.  Patient was evaluated by wound care team for his lower extremity wounds.  His blood sugars were controlled with diabetic medication and sliding scale coverage with insulin aggressively.Echocardiogram done on 02/12/2018 revealed EF of 50 to 55% with normal systolic function.  During the hospitalization patient was moved to stepdown unit for respiratory distress and hypoxia on BiPAP.  He also received empirical antibiotics for right lower lobe possible pneumonia versus atelectasis.  Patient's hypoxia improved also once his diastolic heart failure improved with diuresis with Lasix drip .  Patient was later moved to telemetry floor patient's wife.  Patient's Lasix drip was stopped and he was switched to oral torsemide for further diuresis.  Patient's heart failure exacerbation improved hypoxia resolved blood pressure was better controlled blood sugars were also better controlled.  He will be discharged home and follow-up with primary care physician along with nephrology in the clinic.  CONSULTS OBTAINED:  Treatment Team:  Alwyn Pea, MD Mosetta Pigeon, MD  DRUG ALLERGIES:   Allergies  Allergen Reactions  . Tylenol [Acetaminophen] Nausea And Vomiting    Takes Percocet at home    DISCHARGE MEDICATIONS:   Allergies as of 02/27/2018      Reactions   Tylenol [acetaminophen] Nausea And Vomiting   Takes Percocet at home      Medication List    STOP taking these medications   furosemide 80 MG tablet Commonly known as:  LASIX   lisinopril 10  MG tablet Commonly known as:  PRINIVIL,ZESTRIL   metFORMIN 500 MG 24 hr tablet Commonly known as:  GLUCOPHAGE-XR   naproxen 500 MG tablet Commonly known as:  NAPROSYN     TAKE these medications   albuterol 108 (90 Base) MCG/ACT inhaler Commonly known as:  PROVENTIL HFA;VENTOLIN HFA Inhale 2 puffs into the lungs every 6 (six) hours as  needed for wheezing.   gabapentin 800 MG tablet Commonly known as:  NEURONTIN Take 800 mg by mouth 3 (three) times daily.   insulin aspart 100 UNIT/ML injection Commonly known as:  novoLOG Inject 0-20 Units into the skin 3 (three) times daily with meals for 10 days.   insulin detemir 100 UNIT/ML injection Commonly known as:  LEVEMIR Inject 0.33 mLs (33 Units total) into the skin daily. What changed:  how much to take   ipratropium-albuterol 0.5-2.5 (3) MG/3ML Soln Commonly known as:  DUONEB Take 3 mLs by nebulization every 4 (four) hours as needed.   lidocaine 5 % Commonly known as:  LIDODERM Place 2 patches onto the skin daily for 5 days. Remove & Discard patch within 12 hours or as directed by MD   metoprolol tartrate 25 MG tablet Commonly known as:  LOPRESSOR Take 1 tablet (25 mg total) by mouth 2 (two) times daily.   multivitamin with minerals Tabs tablet Take 1 tablet by mouth daily.   omeprazole 20 MG capsule Commonly known as:  PRILOSEC Take 40 mg by mouth daily.   oxyCODONE 15 MG immediate release tablet Commonly known as:  ROXICODONE Take 7.5 mg by mouth every 8 (eight) hours as needed for pain.   spironolactone 25 MG tablet Commonly known as:  ALDACTONE Take 25 mg by mouth daily.   torsemide 20 MG tablet Commonly known as:  DEMADEX Take 2 tablets (40 mg total) by mouth 2 (two) times daily.   traZODone 50 MG tablet Commonly known as:  DESYREL Take 50 mg by mouth at bedtime as needed for sleep.            Durable Medical Equipment  (From admission, onward)         Start     Ordered   02/23/18 0905  For home use only DME Nebulizer/meds  Once    Question:  Patient needs a nebulizer to treat with the following condition  Answer:  COPD (chronic obstructive pulmonary disease) (HCC)   02/23/18 0906          Today  Patient seen and evaluated today Comfortable on room air No shortness of breath Decreased swelling in the legs No chest  pain Patient will be discharged home with home health services  VITAL SIGNS:  Blood pressure (!) 141/66, pulse 87, temperature 97.6 F (36.4 C), temperature source Oral, resp. rate 18, height 6\' 2"  (1.88 m), weight 122.8 kg, SpO2 96 %.  I/O:    Intake/Output Summary (Last 24 hours) at 02/27/2018 1510 Last data filed at 02/26/2018 1700 Gross per 24 hour  Intake -  Output 0 ml  Net 0 ml    PHYSICAL EXAMINATION:  Physical Exam  GENERAL:  47 y.o.-year-old patient lying in the bed with no acute distress.  LUNGS: Normal breath sounds bilaterally, no wheezing, rales,rhonchi or crepitation. No use of accessory muscles of respiration.  CARDIOVASCULAR: S1, S2 normal. No murmurs, rubs, or gallops.  ABDOMEN: Soft, non-tender, non-distended. Bowel sounds present. No organomegaly or mass.  NEUROLOGIC: Moves all 4 extremities. PSYCHIATRIC: The patient is alert and oriented x 3.  SKIN: No obvious rash, lesion, or ulcer.   DATA REVIEW:   CBC No results for input(s): WBC, HGB, HCT, PLT in the last 168 hours.  Chemistries  Recent Labs  Lab 02/27/18 0256  NA 139  K 5.0  CL 100  CO2 32  GLUCOSE 302*  BUN 62*  CREATININE 1.51*  CALCIUM 8.8*  MG 2.4    Cardiac Enzymes No results for input(s): TROPONINI in the last 168 hours.  Microbiology Results  Results for orders placed or performed during the hospital encounter of 02/12/18  MRSA PCR Screening     Status: None   Collection Time: 02/13/18 12:23 PM  Result Value Ref Range Status   MRSA by PCR NEGATIVE NEGATIVE Final    Comment:        The GeneXpert MRSA Assay (FDA approved for NASAL specimens only), is one component of a comprehensive MRSA colonization surveillance program. It is not intended to diagnose MRSA infection nor to guide or monitor treatment for MRSA infections. Performed at [facility], [address] [place], [place] [address]   C difficile quick scan w PCR reflex     Status: None   Collection  Time: 02/22/18  3:33 AM  Result Value Ref Range Status   C Diff antigen NEGATIVE NEGATIVE Final   C Diff toxin NEGATIVE NEGATIVE Final   C Diff interpretation No C. difficile detected.  Final    Comment: Performed at [facility], [address] [place], [place] [address]    RADIOLOGY:  No results found.  Follow up with PCP in 1 week.  Management plans discussed with the patient, family and they are in agreement.  CODE STATUS: Full code    Code Status Orders  (From admission, onward)         Start     Ordered   02/12/18 0339  Full code  Continuous     02/12/18 0338        Code Status History    This patient has a current code status but no historical code status.      TOTAL TIME TAKING CARE OF THIS PATIENT ON DAY OF DISCHARGE: more than 35 minutes.   Ihor AustinPavan Pyreddy M.D on 02/27/2018 at 3:10 PM  Between 7am to 6pm - Pager - [phone]  After 6pm go to [email or web address] - password [email or web address] [facility]  [facility]  Office  [phone]  CC: Primary care physician; [facility]  Note: This dictation was prepared with Nurse, children'sDragon dictation along with smaller phrase technology. Any transcriptional errors that result from this process are unintentional.

## 2018-02-27 NOTE — Progress Notes (Signed)
Advanced care plan. Purpose of the Encounter: CODE STATUS Parties in Attendance: Patient Patient's Decision Capacity: Good Subjective/Patient's story: Presented to emergency room with shortness of breath Objective/Medical story Has heart failure exacerbation Needs IV Lasix for diuresis and further work-up Needs control of blood pressure Goals of care determination:  Advance care directives goals of care discussed Patient wants everything done which includes cpr and intubation if need arises CODE STATUS: Full code Time spent discussing advanced care planning: 16 minutes

## 2018-02-27 NOTE — Progress Notes (Signed)
Discharge instructions explained to pt/ verbalized an understanding/ iv and tele removed/ RX given to pt/ cane provided/ transported off unit via wheelchair.

## 2018-02-27 NOTE — Progress Notes (Signed)
[facility], [place] 02/27/18  Subjective:  Creatinine down to 1.51 with a urine output of 1.9 L over the preceding 24 hours. Torsemide to be restarted today.    Objective:  Vital signs in last 24 hours:  Temp:  [98.1 F (36.7 C)-98.6 F (37 C)] 98.3 F (36.8 C) (08/24 0321) Pulse Rate:  [79-86] 86 (08/24 0321) Resp:  [18] 18 (08/24 0321) BP: (111-135)/(64-78) 135/68 (08/24 0321) SpO2:  [91 %-96 %] 91 % (08/24 0321) Weight:  [122.8 kg] 122.8 kg (08/24 0321)  Weight change: 1.361 kg Filed Weights   02/25/18 0406 02/26/18 0535 02/27/18 0321  Weight: 121.2 kg 121.5 kg 122.8 kg    Intake/Output:    Intake/Output Summary (Last 24 hours) at 02/27/2018 0813 Last data filed at 02/26/2018 1700 Gross per 24 hour  Intake 647.09 ml  Output 1900 ml  Net -1252.91 ml    Physical Exam: General:  No acute distress, laying in the bed  HEENT  anicteric, moist oral mucous membranes  Neck:  Supple  Lungs:  bibasilar crackles, normal effort  Heart::  No rub, regular rhythm  Abdomen:  Soft, nontender  Extremities:  2+ LE edema  Neurologic:  Awake, alert, follows ocmmands  Skin:  Congestive hyperemia in LE's         Basic Metabolic Panel:  Recent Labs  Lab 02/21/18 0447 02/22/18 0446 02/23/18 0337 02/25/18 0917 02/26/18 0504 02/27/18 0256  NA 140 141 140 139  --  139  K 4.2 4.1 4.7 4.8  --  5.0  CL 99 97* 100 100  --  100  CO2 35* 37* 33* 32  --  32  GLUCOSE 233* 266* 262* 226*  --  302*  BUN 56* 44* 46* 63*  --  62*  CREATININE 1.38* 1.30* 1.64* 1.78* 1.97* 1.51*  CALCIUM 8.1* 8.3* 7.7* 8.0*  --  8.8*  MG  --   --   --   --   --  2.4     CBC: No results for input(s): WBC, NEUTROABS, HGB, HCT, MCV, PLT in the last 168 hours.   No results found for: HEPBSAG, HEPBSAB, HEPBIGM    Microbiology:  Recent Results (from the past 240 hour(s))  C difficile quick scan w PCR reflex     Status: None   Collection Time: 02/22/18  3:33 AM   Result Value Ref Range Status   C Diff antigen NEGATIVE NEGATIVE Final   C Diff toxin NEGATIVE NEGATIVE Final   C Diff interpretation No C. difficile detected.  Final    Comment: Performed at [facility], [address] [place], [place] [address]    Coagulation Studies: No results for input(s): LABPROT, INR in the last 72 hours.  Urinalysis: No results for input(s): COLORURINE, LABSPEC, PHURINE, GLUCOSEU, HGBUR, BILIRUBINUR, KETONESUR, PROTEINUR, UROBILINOGEN, NITRITE, LEUKOCYTESUR in the last 72 hours.  Invalid input(s): APPERANCEUR    Imaging: No results found.   Medications:   . sodium chloride 500 mL (02/15/18 0047)   . budesonide (PULMICORT) nebulizer solution  0.5 mg Nebulization BID  . chlorhexidine  15 mL Mouth Rinse BID  . collagenase   Topical Daily  . enoxaparin (LOVENOX) injection  40 mg Subcutaneous Q24H  . gabapentin  800 mg Oral TID  . insulin aspart  0-20 Units Subcutaneous TID WC  . insulin aspart  0-5 Units Subcutaneous QHS  . insulin aspart  7 Units Subcutaneous TID WC  . insulin glargine  32 Units Subcutaneous QHS  .  ipratropium-albuterol  3 mL Nebulization BID  . lidocaine  2 patch Transdermal Q24H  . lisinopril  10 mg Oral Daily  . mouth rinse  15 mL Mouth Rinse BID  . Melatonin  5 mg Oral QHS  . pantoprazole  40 mg Oral BID  . zolpidem  10 mg Oral QHS   sodium chloride, acetaminophen **OR** acetaminophen, albuterol, hydrALAZINE, ondansetron **OR** ondansetron (ZOFRAN) IV, oxyCODONE  Assessment/ Plan:  47 y.o. male  with HTN, dCHF, Diabetes with complications (neuropathy), history of positive ANA April 2019,  was admitted on 02/12/2018 with massive volume overload  1.  Acute kidney injury.  Baseline creatinine 1.13 from May/27/2019/GFR > 60 2.  Generalized edema 3.  Hypoalbuminemia 4.  Diabetes mellitus type with CKD.  5.  Hyperkalemia  Plan: Lasix drip was held yesterday.  Creatinine has improved a bit.  We will transition the  patient back to torsemide 40 mg p.o. twice daily.  He will need monitoring of his renal parameters as an outpatient.  He will also need close monitoring of his weight and edema status.  Disposition as per hospitalist.   LOS: 14 Jlyn Cerros 8/24/20198:13 AM  [facility] [place], [place] [phone]  Note: This note was prepared with Dragon dictation. Any transcription errors are unintentional

## 2018-03-02 ENCOUNTER — Telehealth: Payer: Self-pay | Admitting: Family

## 2018-03-02 ENCOUNTER — Ambulatory Visit: Payer: Disability Insurance | Admitting: Family

## 2018-03-02 NOTE — Progress Notes (Deleted)
   Patient ID: John RiggsMichael Lawson, male    DOB: 09/14/1970, 47 y.o.   MRN: [id number]  HPI  [name] is a 47 y/o male with a history of  Echo report from 02/12/18 reviewed and showed an EF of 50-55%.  Admitted 02/12/18 due to acute on chronic HF along with HTN. Initially placed on lasix drip and then transitioned to oral diuretics. Needed bipap short-term. Nephrology, cardiology and wound care consulted. Medications adjusted and he was discharged after 15 days.   He presents today for his initial visit with a chief complaint of   Review of Systems    Physical Exam    Assessment & Plan:  1: Chronic heart failure with preserved ejection fraction- - NYHA class -  BNP 02/12/18 was 151.0  2: HTN- - BMP 02/27/18 reviewed and showed sodium 139, potassium 5.0, creatinine 1.51 and GFR 53  3: DM- - A1c 02/17/18 was 7.5%

## 2018-03-02 NOTE — Telephone Encounter (Signed)
Patient did not show for his [facility] Failure [facility] appointment on 03/02/18. Will attempt to reschedule.  

## 2018-03-04 ENCOUNTER — Telehealth: Payer: Self-pay

## 2018-03-04 NOTE — Telephone Encounter (Signed)
EMMI Follow-up: Mr. John Lawson called as he had received an automated call from my number.  Mr. John Lawson stated he had Rx's filled and had to reschedule one follow-up appointment.  Said he had 10-12 lb. weight gain since discharge. I asked if [facility] had been out to see him yet and responded no. I called [facility] and Pattricia Bossnnie said this patient was in the system to be seen and he should call [phone] and ask to speak to the scheduler so he would know when they would be coming out.  I suggested he let the scheduler know of the weight gain since he is a CHF patient and also the RN that comes to the home. He thanked me for the assistance. I let him know there would be 2nd call with a different series of questions and to let us know if he had any further concerns.

## 2018-03-17 ENCOUNTER — Ambulatory Visit: Payer: Medicaid Other | Attending: Family | Admitting: Family

## 2018-03-17 ENCOUNTER — Encounter: Payer: Self-pay | Admitting: Family

## 2018-03-17 VITALS — BP 118/57 | HR 73 | Temp 98.0°F | Resp 18 | Ht 74.0 in | Wt 270.5 lb

## 2018-03-17 DIAGNOSIS — I5032 Chronic diastolic (congestive) heart failure: Secondary | ICD-10-CM | POA: Diagnosis not present

## 2018-03-17 DIAGNOSIS — Z8349 Family history of other endocrine, nutritional and metabolic diseases: Secondary | ICD-10-CM | POA: Diagnosis not present

## 2018-03-17 DIAGNOSIS — I890 Lymphedema, not elsewhere classified: Secondary | ICD-10-CM | POA: Diagnosis not present

## 2018-03-17 DIAGNOSIS — I110 Hypertensive heart disease with heart failure: Secondary | ICD-10-CM | POA: Diagnosis not present

## 2018-03-17 DIAGNOSIS — R0683 Snoring: Secondary | ICD-10-CM | POA: Diagnosis not present

## 2018-03-17 DIAGNOSIS — Z886 Allergy status to analgesic agent status: Secondary | ICD-10-CM | POA: Diagnosis not present

## 2018-03-17 DIAGNOSIS — E119 Type 2 diabetes mellitus without complications: Secondary | ICD-10-CM | POA: Diagnosis not present

## 2018-03-17 DIAGNOSIS — I10 Essential (primary) hypertension: Secondary | ICD-10-CM

## 2018-03-17 DIAGNOSIS — Z794 Long term (current) use of insulin: Secondary | ICD-10-CM | POA: Diagnosis not present

## 2018-03-17 DIAGNOSIS — E109 Type 1 diabetes mellitus without complications: Secondary | ICD-10-CM

## 2018-03-17 DIAGNOSIS — Z79899 Other long term (current) drug therapy: Secondary | ICD-10-CM | POA: Insufficient documentation

## 2018-03-17 DIAGNOSIS — Z833 Family history of diabetes mellitus: Secondary | ICD-10-CM | POA: Diagnosis not present

## 2018-03-17 DIAGNOSIS — I509 Heart failure, unspecified: Secondary | ICD-10-CM | POA: Diagnosis present

## 2018-03-17 NOTE — Patient Instructions (Signed)
Continue weighing daily and call for an overnight weight gain of > 2 pounds or a weekly weight gain of >5 pounds. 

## 2018-03-17 NOTE — Progress Notes (Signed)
Patient ID: John Lawson, male    DOB: 08-21-1970, 47 y.o.   MRN: [id number]  HPI  John Lawson is a 47 y/o male with a history of HTN, DM and chronic heart failure.   Echo report from 02/12/18 reviewed and showed an EF of 50-55%.  Admitted 02/12/18 due to acute on chronic HF along with HTN. Initially placed on lasix drip and then transitioned to oral diuretics. Needed bipap short-term. Nephrology, cardiology and wound care consulted. Medications adjusted and he was discharged after 15 days.   He presents today for his initial visit with a chief complaint of moderate fatigue upon minimal exertion. He says that this has been present for many months. He has associated shortness of breath, dizziness, intermittent chest pain, + snoring and difficulty sleeping along with this. He denies abdominal distention, cough or weight gain. Has been weighing himself daily but usually weighs himself at night.   Past Medical History:  Diagnosis Date  . Chronic diastolic CHF (congestive heart failure) (HCC)   . Diabetes mellitus without complication (HCC)   . Hypertension    Past Surgical History:  Procedure Laterality Date  . SKIN DEBRIDEMENT    . SPHINCTEROTOMY    . TOE AMPUTATION     Family History  Problem Relation Age of Onset  . Diabetes Mother   . Cancer Mother   . Thyroid disease Mother   . Diabetes Father   . Diabetes Sister    Social History   Tobacco Use  . Smoking status: Never Smoker  . Smokeless tobacco: Never Used  Substance Use Topics  . Alcohol use: Not on file   Allergies  Allergen Reactions  . Tylenol [Acetaminophen] Nausea And Vomiting    Takes Percocet at home   Prior to Admission medications   Medication Sig Start Date End Date Taking? Authorizing Provider  albuterol (PROVENTIL HFA;VENTOLIN HFA) 108 (90 Base) MCG/ACT inhaler Inhale 2 puffs into the lungs every 6 (six) hours as needed for wheezing.   Yes [provider]  gabapentin (NEURONTIN) 800 MG tablet Take  800 mg by mouth 3 (three) times daily.   Yes [provider]  insulin glargine (LANTUS) 100 UNIT/ML injection Inject 30 Units into the skin at bedtime.   Yes [provider]  ipratropium-albuterol (DUONEB) 0.5-2.5 (3) MG/3ML SOLN Take 3 mLs by nebulization every 4 (four) hours as needed. 02/27/18 02/27/19 Yes Pyreddy, Vivien Rota, MD  metoprolol tartrate (LOPRESSOR) 25 MG tablet Take 1 tablet (25 mg total) by mouth 2 (two) times daily. 02/27/18 03/29/18 Yes Pyreddy, Vivien Rota, MD  omeprazole (PRILOSEC) 20 MG capsule Take 40 mg by mouth daily.    Yes [provider]  oxyCODONE (ROXICODONE) 15 MG immediate release tablet Take 7.5 mg by mouth every 8 (eight) hours as needed for pain.   Yes [provider]  spironolactone (ALDACTONE) 25 MG tablet Take 25 mg by mouth daily.   Yes [provider]  torsemide (DEMADEX) 20 MG tablet Take 2 tablets (40 mg total) by mouth 2 (two) times daily. Patient taking differently: Take 80 mg by mouth 2 (two) times daily.  02/27/18 03/29/18 Yes Pyreddy, Vivien Rota, MD  traZODone (DESYREL) 50 MG tablet Take 50 mg by mouth at bedtime as needed for sleep.    Yes [provider]   Review of Systems  Constitutional: Positive for fatigue (tire easily). Negative for appetite change.  HENT: Negative for congestion, postnasal drip and sore throat.   Eyes: Negative.   Respiratory: Positive for shortness  of breath (easily). Negative for cough.   Cardiovascular: Positive for chest pain (on occasion) and leg swelling. Negative for palpitations.  Gastrointestinal: Negative for abdominal distention and abdominal pain.  Endocrine: Negative.   Genitourinary: Negative.   Musculoskeletal: Positive for back pain and neck pain.  Allergic/Immunologic: Negative.   Neurological: Positive for dizziness and light-headedness.  Hematological: Negative for adenopathy. Does not bruise/bleed easily.  Psychiatric/Behavioral: Positive for sleep disturbance (not  sleeping well). Negative for dysphoric mood. The patient is not nervous/anxious.    Vitals:   03/17/18 1447  BP: (!) 118/57  Pulse: 73  Resp: 18  Temp: 98 F (36.7 C)  TempSrc: Oral  SpO2: 96%  Weight: 270 lb 8 oz (122.7 kg)  Height: 6\' 2"  (1.88 m)   Wt Readings from Last 3 Encounters:  03/17/18 270 lb 8 oz (122.7 kg)  02/27/18 270 lb 12.8 oz (122.8 kg)   Lab Results  Component Value Date   CREATININE 1.51 (H) 02/27/2018   CREATININE 1.97 (H) 02/26/2018   CREATININE 1.78 (H) 02/25/2018    Physical Exam  Constitutional: He is oriented to person, place, and time. He appears well-developed and well-nourished.  HENT:  Head: Normocephalic and atraumatic.  Neck: Normal range of motion. Neck supple. No JVD present.  Cardiovascular: Normal rate and regular rhythm.  Pulmonary/Chest: Effort normal. No respiratory distress. He has no wheezes. He has no rales.  Abdominal: Soft. He exhibits no distension. There is no tenderness.  Musculoskeletal: He exhibits edema (2+ pitting edema with R>L). He exhibits no tenderness.  Neurological: He is alert and oriented to person, place, and time.  Skin: Skin is warm and dry.  Psychiatric: He has a normal mood and affect. His behavior is normal. Thought content normal.  Nursing note and vitals reviewed.   Assessment & Plan:  1: Chronic heart failure with preserved ejection fraction- - NYHA class III - mildly fluid overloaded today with pitting edema - weighing daily but says that he's been weighing himself at night. Instructed to weigh every morning and call for an overnight weight gain of >2 pounds or a weekly weight gain of >5 pounds - not adding salt and he and his wife have been reviewing food labels for sodium content. Reviewed the importance of closely following a 2000mg  sodium diet and written dietary information was given to him about this.  - currently has [facility] coming twice weekly - BNP 02/12/18 was 151.0  2: HTN- - BP  looks good today - sees Dr. Allena Katz at [facility] on 9/191/9 - BMP 03/11/18 reviewed and showed sodium 142, potassium 5.4, creatinine 1.22 and GFR 69  3: DM- - glucose at home was 183 - A1c 02/17/18 was 7.5%  4: Lymphedema- - stage 2 - has wrapped legs in the past but hasn't been consistently doing it recently; encouraged him to wrap them daily with removal at bedtime - elevate legs when sitting for long periods of time - consider lymphapress compression boots if edema persists  5: Snoring- - patient and wife endorse snoring - patient says that he can go many days without sleeping and he doesn't know why - will make referral to [facility] to get a sleep study to rule out sleep apnea  Medication list was reviewed.  Return in 6 weeks or sooner for any questions/problems before then.

## 2018-03-18 ENCOUNTER — Encounter: Payer: Self-pay | Admitting: Family

## 2018-03-18 DIAGNOSIS — R0683 Snoring: Secondary | ICD-10-CM | POA: Insufficient documentation

## 2018-03-18 DIAGNOSIS — I890 Lymphedema, not elsewhere classified: Secondary | ICD-10-CM | POA: Insufficient documentation

## 2018-04-07 ENCOUNTER — Other Ambulatory Visit: Payer: Self-pay

## 2018-04-07 ENCOUNTER — Inpatient Hospital Stay
Admission: EM | Admit: 2018-04-07 | Discharge: 2018-04-09 | DRG: 091 | Disposition: A | Discharge status: Home Health | Payer: Medicaid Other | Attending: Internal Medicine | Admitting: Internal Medicine

## 2018-04-07 ENCOUNTER — Encounter: Payer: Self-pay | Admitting: Emergency Medicine

## 2018-04-07 ENCOUNTER — Emergency Department: Payer: Medicaid Other

## 2018-04-07 ENCOUNTER — Inpatient Hospital Stay
Admit: 2018-04-07 | Discharge: 2018-04-07 | Disposition: A | Discharge status: Home / Self Care | Payer: Medicaid Other | Attending: Internal Medicine | Admitting: Internal Medicine

## 2018-04-07 DIAGNOSIS — E1121 Type 2 diabetes mellitus with diabetic nephropathy: Secondary | ICD-10-CM | POA: Diagnosis present

## 2018-04-07 DIAGNOSIS — D649 Anemia, unspecified: Secondary | ICD-10-CM

## 2018-04-07 DIAGNOSIS — Z79899 Other long term (current) drug therapy: Secondary | ICD-10-CM

## 2018-04-07 DIAGNOSIS — E1140 Type 2 diabetes mellitus with diabetic neuropathy, unspecified: Secondary | ICD-10-CM | POA: Diagnosis present

## 2018-04-07 DIAGNOSIS — E8809 Other disorders of plasma-protein metabolism, not elsewhere classified: Secondary | ICD-10-CM | POA: Diagnosis present

## 2018-04-07 DIAGNOSIS — E1122 Type 2 diabetes mellitus with diabetic chronic kidney disease: Secondary | ICD-10-CM | POA: Diagnosis present

## 2018-04-07 DIAGNOSIS — T426X5A Adverse effect of other antiepileptic and sedative-hypnotic drugs, initial encounter: Secondary | ICD-10-CM | POA: Diagnosis present

## 2018-04-07 DIAGNOSIS — R4182 Altered mental status, unspecified: Secondary | ICD-10-CM | POA: Diagnosis not present

## 2018-04-07 DIAGNOSIS — R188 Other ascites: Secondary | ICD-10-CM

## 2018-04-07 DIAGNOSIS — I130 Hypertensive heart and chronic kidney disease with heart failure and stage 1 through stage 4 chronic kidney disease, or unspecified chronic kidney disease: Secondary | ICD-10-CM | POA: Diagnosis present

## 2018-04-07 DIAGNOSIS — D638 Anemia in other chronic diseases classified elsewhere: Secondary | ICD-10-CM | POA: Diagnosis present

## 2018-04-07 DIAGNOSIS — M545 Low back pain: Secondary | ICD-10-CM | POA: Diagnosis present

## 2018-04-07 DIAGNOSIS — G8929 Other chronic pain: Secondary | ICD-10-CM | POA: Diagnosis present

## 2018-04-07 DIAGNOSIS — Z89429 Acquired absence of other toe(s), unspecified side: Secondary | ICD-10-CM | POA: Diagnosis not present

## 2018-04-07 DIAGNOSIS — Z794 Long term (current) use of insulin: Secondary | ICD-10-CM | POA: Diagnosis not present

## 2018-04-07 DIAGNOSIS — K7460 Unspecified cirrhosis of liver: Secondary | ICD-10-CM

## 2018-04-07 DIAGNOSIS — N179 Acute kidney failure, unspecified: Secondary | ICD-10-CM | POA: Diagnosis present

## 2018-04-07 DIAGNOSIS — G9340 Encephalopathy, unspecified: Secondary | ICD-10-CM | POA: Diagnosis not present

## 2018-04-07 DIAGNOSIS — Z833 Family history of diabetes mellitus: Secondary | ICD-10-CM | POA: Diagnosis not present

## 2018-04-07 DIAGNOSIS — E785 Hyperlipidemia, unspecified: Secondary | ICD-10-CM | POA: Diagnosis present

## 2018-04-07 DIAGNOSIS — I5033 Acute on chronic diastolic (congestive) heart failure: Secondary | ICD-10-CM

## 2018-04-07 DIAGNOSIS — G92 Toxic encephalopathy: Principal | ICD-10-CM | POA: Diagnosis present

## 2018-04-07 DIAGNOSIS — E1165 Type 2 diabetes mellitus with hyperglycemia: Secondary | ICD-10-CM | POA: Diagnosis present

## 2018-04-07 DIAGNOSIS — E872 Acidosis: Secondary | ICD-10-CM | POA: Diagnosis present

## 2018-04-07 DIAGNOSIS — N183 Chronic kidney disease, stage 3 (moderate): Secondary | ICD-10-CM | POA: Diagnosis present

## 2018-04-07 DIAGNOSIS — R680 Hypothermia, not associated with low environmental temperature: Secondary | ICD-10-CM | POA: Diagnosis present

## 2018-04-07 DIAGNOSIS — A419 Sepsis, unspecified organism: Secondary | ICD-10-CM | POA: Diagnosis present

## 2018-04-07 DIAGNOSIS — T402X5A Adverse effect of other opioids, initial encounter: Secondary | ICD-10-CM | POA: Diagnosis present

## 2018-04-07 DIAGNOSIS — I509 Heart failure, unspecified: Secondary | ICD-10-CM

## 2018-04-07 LAB — LACTIC ACID, PLASMA
LACTIC ACID, VENOUS: 2 mmol/L — AB (ref 0.5–1.9)
Lactic Acid, Venous: 5.1 mmol/L (ref 0.5–1.9)

## 2018-04-07 LAB — COMPREHENSIVE METABOLIC PANEL
ALBUMIN: 3.1 g/dL — AB (ref 3.5–5.0)
ALK PHOS: 134 U/L — AB (ref 38–126)
ALT: 21 U/L (ref 0–44)
AST: 31 U/L (ref 15–41)
Anion gap: 13 (ref 5–15)
BUN: 34 mg/dL — ABNORMAL HIGH (ref 6–20)
CALCIUM: 8 mg/dL — AB (ref 8.9–10.3)
CHLORIDE: 100 mmol/L (ref 98–111)
CO2: 25 mmol/L (ref 22–32)
Creatinine, Ser: 2.23 mg/dL — ABNORMAL HIGH (ref 0.61–1.24)
GFR calc Af Amer: 38 mL/min — ABNORMAL LOW (ref >60)
GFR calc non Af Amer: 33 mL/min — ABNORMAL LOW (ref >60)
GLUCOSE: 400 mg/dL — AB (ref 70–99)
POTASSIUM: 4.7 mmol/L (ref 3.5–5.1)
SODIUM: 138 mmol/L (ref 135–145)
Total Bilirubin: 0.5 mg/dL (ref 0.3–1.2)
Total Protein: 6.8 g/dL (ref 6.5–8.1)

## 2018-04-07 LAB — CBC WITH DIFFERENTIAL/PLATELET
BASOS PCT: 0 %
Basophils Absolute: 0.1 10*3/uL (ref 0–0.1)
EOS ABS: 0.1 10*3/uL (ref 0–0.7)
EOS PCT: 0 %
HCT: 33 % — ABNORMAL LOW (ref 40.0–52.0)
HEMOGLOBIN: 10.7 g/dL — AB (ref 13.0–18.0)
Lymphocytes Relative: 5 %
Lymphs Abs: 0.9 10*3/uL — ABNORMAL LOW (ref 1.0–3.6)
MCH: 26.7 pg (ref 26.0–34.0)
MCHC: 32.5 g/dL (ref 32.0–36.0)
MCV: 82.1 fL (ref 80.0–100.0)
Monocytes Absolute: 0.9 10*3/uL (ref 0.2–1.0)
Monocytes Relative: 5 %
NEUTROS PCT: 90 %
Neutro Abs: 14.9 10*3/uL — ABNORMAL HIGH (ref 1.4–6.5)
PLATELETS: 301 10*3/uL (ref 150–440)
RBC: 4.02 MIL/uL — ABNORMAL LOW (ref 4.40–5.90)
RDW: 16.3 % — ABNORMAL HIGH (ref 11.5–14.5)
WBC: 16.7 10*3/uL — AB (ref 3.8–10.6)

## 2018-04-07 LAB — URINE DRUG SCREEN, QUALITATIVE (ARMC ONLY)
Amphetamines, Ur Screen: NOT DETECTED
BARBITURATES, UR SCREEN: NOT DETECTED
BENZODIAZEPINE, UR SCRN: NOT DETECTED
CANNABINOID 50 NG, UR ~~LOC~~: NOT DETECTED
Cocaine Metabolite,Ur ~~LOC~~: NOT DETECTED
MDMA (Ecstasy)Ur Screen: NOT DETECTED
METHADONE SCREEN, URINE: NOT DETECTED
OPIATE, UR SCREEN: POSITIVE — AB
Phencyclidine (PCP) Ur S: NOT DETECTED
Tricyclic, Ur Screen: NOT DETECTED

## 2018-04-07 LAB — ECHOCARDIOGRAM LIMITED
Height: 74 in
Weight: 4754.88 oz

## 2018-04-07 LAB — TSH: TSH: 4.943 u[IU]/mL — ABNORMAL HIGH (ref 0.350–4.500)

## 2018-04-07 LAB — GLUCOSE, CAPILLARY
GLUCOSE-CAPILLARY: 222 mg/dL — AB (ref 70–99)
GLUCOSE-CAPILLARY: 196 mg/dL — AB (ref 70–99)
Glucose-Capillary: 374 mg/dL — ABNORMAL HIGH (ref 70–99)
Glucose-Capillary: 384 mg/dL — ABNORMAL HIGH (ref 70–99)
Glucose-Capillary: 347 mg/dL — ABNORMAL HIGH (ref 70–99)

## 2018-04-07 LAB — URINALYSIS, COMPLETE (UACMP) WITH MICROSCOPIC
BILIRUBIN URINE: NEGATIVE
Glucose, UA: >=500 mg/dL — AB
HGB URINE DIPSTICK: NEGATIVE
KETONES UR: NEGATIVE mg/dL
Leukocytes, UA: NEGATIVE
NITRITE: NEGATIVE
PH: 5 (ref 5.0–8.0)
Protein, ur: 100 mg/dL — AB
Specific Gravity, Urine: 1.01 (ref 1.005–1.030)

## 2018-04-07 LAB — ETHANOL: Alcohol, Ethyl (B): <10 mg/dL (ref <10)

## 2018-04-07 LAB — MAGNESIUM: Magnesium: 2 mg/dL (ref 1.7–2.4)

## 2018-04-07 LAB — STREP PNEUMONIAE URINARY ANTIGEN: Strep Pneumo Urinary Antigen: NEGATIVE

## 2018-04-07 LAB — PREALBUMIN: Prealbumin: 14 mg/dL — ABNORMAL LOW (ref 18–38)

## 2018-04-07 LAB — PROCALCITONIN

## 2018-04-07 LAB — TROPONIN I: Troponin I: <0.03 ng/mL (ref <0.03)

## 2018-04-07 LAB — MRSA PCR SCREENING: MRSA BY PCR: NEGATIVE

## 2018-04-07 LAB — AMMONIA: AMMONIA: 17 umol/L (ref 9–35)

## 2018-04-07 LAB — BRAIN NATRIURETIC PEPTIDE: B NATRIURETIC PEPTIDE 5: 39 pg/mL (ref 0.0–100.0)

## 2018-04-07 LAB — PHOSPHORUS: Phosphorus: 6.1 mg/dL — ABNORMAL HIGH (ref 2.5–4.6)

## 2018-04-07 MED ORDER — BISACODYL 5 MG PO TBEC: 5.0000 mg | DELAYED_RELEASE_TABLET | Freq: Every day | ORAL | Status: DC | PRN

## 2018-04-07 MED ORDER — OXYCODONE HCL 10 MG PO TABS: 10.0000 mg | ORAL_TABLET | Freq: Three times a day (TID) | ORAL | Status: DC

## 2018-04-07 MED ORDER — PANTOPRAZOLE SODIUM 40 MG PO TBEC
40.0000 mg | DELAYED_RELEASE_TABLET | Freq: Every day | ORAL | Status: DC
  Administered 2018-04-07 – 2018-04-09 (×3): 40 mg via ORAL
  Filled 2018-04-07 (×3): qty 1

## 2018-04-07 MED ORDER — PIPERACILLIN-TAZOBACTAM 3.375 G IVPB 30 MIN
3.3750 g | Freq: Once | INTRAVENOUS | Status: AC
  Administered 2018-04-07: 3.375 g via INTRAVENOUS
  Filled 2018-04-07: qty 50

## 2018-04-07 MED ORDER — INSULIN GLARGINE 100 UNIT/ML ~~LOC~~ SOLN
20.0000 [IU] | Freq: Every day | SUBCUTANEOUS | Status: DC
  Administered 2018-04-08: 20 [IU] via SUBCUTANEOUS
  Filled 2018-04-07 (×2): qty 0.2

## 2018-04-07 MED ORDER — ONDANSETRON HCL 4 MG PO TABS: 4.0000 mg | ORAL_TABLET | Freq: Four times a day (QID) | ORAL | Status: DC | PRN

## 2018-04-07 MED ORDER — OXYCODONE HCL 5 MG PO TABS
10.0000 mg | ORAL_TABLET | Freq: Three times a day (TID) | ORAL | Status: DC | PRN
  Administered 2018-04-07 – 2018-04-08 (×2): 10 mg via ORAL
  Filled 2018-04-07 (×2): qty 2

## 2018-04-07 MED ORDER — VANCOMYCIN HCL IN DEXTROSE 1-5 GM/200ML-% IV SOLN
1000.0000 mg | Freq: Once | INTRAVENOUS | Status: AC
  Administered 2018-04-07: 1000 mg via INTRAVENOUS
  Filled 2018-04-07: qty 200

## 2018-04-07 MED ORDER — FUROSEMIDE 10 MG/ML IJ SOLN
40.0000 mg | Freq: Once | INTRAMUSCULAR | Status: AC
  Administered 2018-04-07: 40 mg via INTRAVENOUS
  Filled 2018-04-07: qty 4

## 2018-04-07 MED ORDER — VANCOMYCIN HCL 10 G IV SOLR: 1.0000 g | Freq: Once | INTRAVENOUS | Status: DC

## 2018-04-07 MED ORDER — INSULIN ASPART 100 UNIT/ML ~~LOC~~ SOLN: 0.0000 [IU] | Freq: Every day | SUBCUTANEOUS | Status: DC

## 2018-04-07 MED ORDER — SENNOSIDES-DOCUSATE SODIUM 8.6-50 MG PO TABS: 1.0000 | ORAL_TABLET | Freq: Every evening | ORAL | Status: DC | PRN

## 2018-04-07 MED ORDER — PIPERACILLIN-TAZOBACTAM 3.375 G IVPB: 3.3750 g | Freq: Three times a day (TID) | INTRAVENOUS | Status: DC

## 2018-04-07 MED ORDER — VANCOMYCIN HCL 10 G IV SOLR
1500.0000 mg | INTRAVENOUS | Status: DC
  Filled 2018-04-07: qty 1500

## 2018-04-07 MED ORDER — IPRATROPIUM-ALBUTEROL 0.5-2.5 (3) MG/3ML IN SOLN: 3.0000 mL | RESPIRATORY_TRACT | Status: DC | PRN

## 2018-04-07 MED ORDER — GABAPENTIN 400 MG PO CAPS
800.0000 mg | ORAL_CAPSULE | Freq: Three times a day (TID) | ORAL | Status: DC
  Administered 2018-04-07 – 2018-04-08 (×3): 800 mg via ORAL
  Filled 2018-04-07 (×3): qty 2

## 2018-04-07 MED ORDER — METOPROLOL TARTRATE 25 MG PO TABS
25.0000 mg | ORAL_TABLET | Freq: Two times a day (BID) | ORAL | Status: DC
  Administered 2018-04-07 – 2018-04-09 (×5): 25 mg via ORAL
  Filled 2018-04-07 (×5): qty 1

## 2018-04-07 MED ORDER — SODIUM CHLORIDE 0.9 % IV SOLN: 200.0000 mg | Freq: Once | INTRAVENOUS | Status: DC

## 2018-04-07 MED ORDER — HEPARIN SODIUM (PORCINE) 5000 UNIT/ML IJ SOLN
5000.0000 [IU] | Freq: Three times a day (TID) | INTRAMUSCULAR | Status: DC
  Administered 2018-04-07 – 2018-04-09 (×7): 5000 [IU] via SUBCUTANEOUS
  Filled 2018-04-07 (×7): qty 1

## 2018-04-07 MED ORDER — PERFLUTREN LIPID MICROSPHERE
1.0000 mL | INTRAVENOUS | Status: AC | PRN
  Administered 2018-04-07: 2 mL via INTRAVENOUS
  Filled 2018-04-07: qty 10

## 2018-04-07 MED ORDER — FENTANYL CITRATE (PF) 100 MCG/2ML IJ SOLN
12.5000 ug | INTRAMUSCULAR | Status: DC | PRN
  Administered 2018-04-07 (×3): 25 ug via INTRAVENOUS
  Filled 2018-04-07 (×3): qty 2

## 2018-04-07 MED ORDER — ONDANSETRON HCL 4 MG/2ML IJ SOLN
4.0000 mg | Freq: Four times a day (QID) | INTRAMUSCULAR | Status: DC | PRN
  Administered 2018-04-07: 4 mg via INTRAVENOUS
  Filled 2018-04-07: qty 2

## 2018-04-07 MED ORDER — INSULIN ASPART 100 UNIT/ML ~~LOC~~ SOLN
0.0000 [IU] | Freq: Three times a day (TID) | SUBCUTANEOUS | Status: DC
  Administered 2018-04-07: 11 [IU] via SUBCUTANEOUS
  Administered 2018-04-07 – 2018-04-08 (×2): 5 [IU] via SUBCUTANEOUS
  Administered 2018-04-08: 2 [IU] via SUBCUTANEOUS
  Administered 2018-04-08: 8 [IU] via SUBCUTANEOUS
  Administered 2018-04-09 (×2): 3 [IU] via SUBCUTANEOUS
  Filled 2018-04-07 (×8): qty 1

## 2018-04-07 NOTE — ED Notes (Addendum)
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

## 2018-04-07 NOTE — ED Notes (Signed)
Report given to Alivia RN 

## 2018-04-07 NOTE — Progress Notes (Signed)
*  PRELIMINARY RESULTS* Echocardiogram 2D Echocardiogram has been performed.  Joanette Gula Merrilee Ancona 04/07/2018, 11:55 AM

## 2018-04-07 NOTE — Progress Notes (Signed)
Pt received to ICU 3 via stretcher from ED. Pt drowsy, but easily arousable. Oriented to room. Wife at bedside.

## 2018-04-07 NOTE — Consult Note (Signed)
WOC Nurse wound consult note Reason for Consult:right toe amp incision site, full thickness wound at the plantar surface of the first metatarsal head on the right foot. Wound type: venous insufficiency, neuropathic Pressure Injury POA:NA Measurement:2nd toe incision line is 3cm x 0.4cm and has a pink wound bed and is healing slowly. Pt states it drains "a lot". Plantar surface of left foot at the first metatarsal head 4cm x 3cm x 0.1cm with pink wound bed, surrounded by callous, moderate drainage. No odor noted with either wound. Dorsal portion of right foot is edematous, red, with erythema. Both legs are edematous, red, with erythema, appears as cellulitis. IV antibiotics in use. Wound bed:see above Drainage (amount, consistency, odor) see above Periwound: calloused Dressing procedure/placement/frequency: I have provided nurses with orders for cleansing and apply Aquacel Ag+, top with gauze, wrap with kerlix, daily. Patient could use a Nutritional Consult or at least supplements for optimal wound healing, please order if you agree. We will not follow, but will remain available to this patient, to nursing, and the medical and/or surgical teams.  Please re-consult if we need to assist further.  Barnett Hatter, RN-C, WTA-C, OCA Wound Treatment Associate Ostomy Care Associate

## 2018-04-07 NOTE — Consult Note (Signed)
PULMONARY / CRITICAL CARE MEDICINE   Name: John Lawson MRN: [id number] DOB: [birth year]    ADMISSION DATE:  04/07/2018 CONSULTATION DATE:  04/07/2018  REFERRING MD:  Dr. Marjie Skiff  CHIEF COMPLAINT:  Unresponsive  BRIEF DISCUSSION:  HISTORY OF PRESENT ILLNESS:   John Lawson is a 47 y.o. Male with a PMH of Diastolic CHF, HTN, and DM who presents to [facility] ED on 04/06/18 with c/o unresponsiveness.  Pt's wife reports that when she came home from work around 0200 on 04/07/18, she found the pt leaning forward on the counter, diaphoretic, cool, and pale, prompting her to contact EMS.  She states she performed CPR, but when deputy arrived it was noted that pt had a palpable weak pulse.  Initial workup in the ED revealed WBC 16.7, Lactic acid 5.1, Procalcitonin <0.1, BNP 39, troponin negative, and Creatinine 2.23.  Pt was noted to be hypothermic with temp 92.7 F rectally.  CXR concerning for right pleural effusion and basilar opacity likely atelectasis.    PAST MEDICAL HISTORY :  He  has a past medical history of Chronic diastolic CHF (congestive heart failure) (HCC), Diabetes mellitus without complication (HCC), and Hypertension.  PAST SURGICAL HISTORY: He  has a past surgical history that includes Toe amputation; Sphincterotomy; and Skin debridement.  Allergies  Allergen Reactions  . Tylenol [Acetaminophen] Nausea And Vomiting    Takes Percocet at home    No current facility-administered medications on file prior to encounter.    Current Outpatient Medications on File Prior to Encounter  Medication Sig  . albuterol (PROVENTIL HFA;VENTOLIN HFA) 108 (90 Base) MCG/ACT inhaler Inhale 2 puffs into the lungs every 6 (six) hours as needed for wheezing.  . gabapentin (NEURONTIN) 800 MG tablet Take 800 mg by mouth 3 (three) times daily.  . insulin glargine (LANTUS) 100 UNIT/ML injection Inject 30 Units into the skin at bedtime.  Marland Kitchen ipratropium-albuterol (DUONEB) 0.5-2.5 (3) MG/3ML SOLN Take 3 mLs by  nebulization every 4 (four) hours as needed.  . metoprolol tartrate (LOPRESSOR) 25 MG tablet Take 1 tablet (25 mg total) by mouth 2 (two) times daily.  Marland Kitchen omeprazole (PRILOSEC) 20 MG capsule Take 40 mg by mouth daily.   . Oxycodone HCl 10 MG TABS Take 1 tablet by mouth 3 (three) times daily.  Marland Kitchen spironolactone (ALDACTONE) 25 MG tablet Take 25 mg by mouth daily.  Marland Kitchen torsemide (DEMADEX) 20 MG tablet Take 2 tablets (40 mg total) by mouth 2 (two) times daily. (Patient taking differently: Take 80 mg by mouth 2 (two) times daily. )  . traZODone (DESYREL) 50 MG tablet Take 50 mg by mouth at bedtime as needed for sleep.   . insulin aspart (NOVOLOG) 100 UNIT/ML injection Inject 0-20 Units into the skin 3 (three) times daily before meals.    FAMILY HISTORY:  His He indicated that the status of his mother is unknown. He indicated that the status of his father is unknown. He indicated that the status of his sister is unknown.   SOCIAL HISTORY: He  reports that he has never smoked. He has never used smokeless tobacco. He reports that he does not drink alcohol or use drugs.    Review of Systems: Limited due to lethargy   VITAL SIGNS: BP 108/61   Pulse 68   Temp (!) 93.6 F (34.2 C)   Resp (!) 8   Ht 6\' 2"  (1.88 m)   Wt 134.8 kg   SpO2 92%   BMI 38.16 kg/m   HEMODYNAMICS:  VENTILATOR SETTINGS:    INTAKE / OUTPUT: No intake/output data recorded.  PHYSICAL EXAMINATION:  GENERAL:critically ill appearing, mild resp distress HEAD: Normocephalic, atraumatic.  EYES: Pupils equal, round, reactive to light.  No scleral icterus.  MOUTH: Moist mucosal membrane. NECK: Supple. No thyromegaly. No nodules. No JVD.  PULMONARY: +rhonchi +crackles CARDIOVASCULAR: S1 and S2. Regular rate and rhythm. No murmurs, rubs, or gallops.  GASTROINTESTINAL: Soft, nontender, -distended. No masses. Positive bowel sounds. No hepatosplenomegaly.  MUSCULOSKELETAL: No swelling, clubbing, or edema.  NEUROLOGIC:  lethargic no focal defecits SKIN:intact,warm,dry    LABS:  BMET Recent Labs  Lab 04/07/18 0336  NA 138  K 4.7  CL 100  CO2 25  BUN 34*  CREATININE 2.23*  GLUCOSE 400*    Electrolytes Recent Labs  Lab 04/07/18 0336 04/07/18 0457  CALCIUM 8.0*  --   MG  --  2.0  PHOS  --  6.1*    CBC Recent Labs  Lab 04/07/18 0336  WBC 16.7*  HGB 10.7*  HCT 33.0*  PLT 301    Coag's No results for input(s): APTT, INR in the last 168 hours.  Sepsis Markers Recent Labs  Lab 04/07/18 0337 04/07/18 0457  LATICACIDVEN 5.1*  --   PROCALCITON  --  <0.10    ABG No results for input(s): PHART, PCO2ART, PO2ART in the last 168 hours.  Liver Enzymes Recent Labs  Lab 04/07/18 0336  AST 31  ALT 21  ALKPHOS 134*  BILITOT 0.5  ALBUMIN 3.1*    Cardiac Enzymes Recent Labs  Lab 04/07/18 0336  TROPONINI <0.03    Glucose Recent Labs  Lab 04/07/18 0331  GLUCAP 374*    Imaging Ct Head Wo Contrast  Result Date: 04/07/2018 CLINICAL DATA:  Confusion.  Unresponsive. EXAM: CT HEAD WITHOUT CONTRAST TECHNIQUE: Contiguous axial images were obtained from the base of the skull through the vertex without intravenous contrast. COMPARISON:  None. FINDINGS: Brain: No intracranial hemorrhage, mass effect, or midline shift. No hydrocephalus. The basilar cisterns are patent. No evidence of territorial infarct or acute ischemia. No extra-axial or intracranial fluid collection. Vascular: No hyperdense vessel. Skull: Normal. Negative for fracture or focal lesion. Sinuses/Orbits: Paranasal sinuses and mastoid air cells are clear. The visualized orbits are unremarkable. Other: None. IMPRESSION: Negative head CT. Electronically Signed   By: Narda Rutherford M.D.   On: 04/07/2018 05:01   Dg Chest Portable 1 View  Result Date: 04/07/2018 CLINICAL DATA:  Confusion. EXAM: PORTABLE CHEST 1 VIEW COMPARISON:  Chest radiograph 02/14/2018 FINDINGS: Low lung volumes. Cardiomegaly is unchanged. Slight  increase in right pleural effusion and basilar opacity. Improved pulmonary edema, residual vascular congestion. No pneumothorax. Unchanged osseous structures. IMPRESSION: Improved pulmonary edema with residual vascular congestion. Slight worsening right pleural effusion and basilar opacity, likely atelectasis. Electronically Signed   By: Narda Rutherford M.D.   On: 04/07/2018 04:49     STUDIES:  CT Head wo Contrast 04/07/18>> Negative CT Chest wo Contrast 04/07/18>>Moderate right pleural effusion, similar in size compared to radiographs over the past 2 months.  Compressive atelectasis in the right lung, with additional multifocal atelectasis and or scarring throughout all lobes of both Lungs.  Multi chamber cardiomegaly with small pericardial effusion. Patulous fluid-filled esophagus, suggesting reflux.   ECHO 02/2018 WNL  CULTURES: Blood x2 10/2>> Urine 10/2>>  ANTIBIOTICS: Vancomycin 10/2>> Zosyn 10/2>>  SIGNIFICANT EVENTS: 10/2>> Admission to [facility]    ASSESSMENT / PLAN:  89 yo WM with acute unresponsiveness with encephalopathy ?etiology in setting of acute  renal failure with RT sided pleural effusion   PULMONARY A: Right pleural effusion on CXR P:   Supplemental O2 as needed to maintain O2 sats >92% Follow intermittent CXR Received 40 mg IV Lasix x1 dose in ED 10/2 Consider Thoracentesis Aggressive pulmonary toilet Incentive spirometry  CARDIOVASCULAR A:  No acute issues -BNP 39, Troponin negative x1 Hx: Diastolic CHF, HTN P:  Cardiac monitoring Maintain MAP>65 Received 40 mg Lasix IV x1 dose in ED 10/2 Obtain Echocardiogram Check CK levels   RENAL A:   AKI Lactic acidosis P:   Monitor I&O's / urinary output Follow BMP Ensure adequate renal perfusion Avoid nephrotoxic agents as able Replace electrolytes as indicated Obtain Renal Ultrasound Trend Lactic Acid  GASTROINTESTINAL A:   No acute issues P:   Keep NPO for now given mental  status Check Ammonia levels  HEMATOLOGIC A:   Anemia without signs of bleeding P:   for VTE prophylaxis Transfuse for Hgb <7.0  INFECTIOUS A:   Leukocytosis Meets SIRS Criteria Sepsis Hypothermia P:   Monitor fever curve Trend WBC's Trend PCT Follow cultures as above Continue Vancomycin and Zosyn Will de-escalate after further assessment  ENDOCRINE A:   Diabetes Mellitus   P:   CBG's SSI Follow ICU Hypo/hyperglycemia protocol  NEUROLOGIC A:   Acute Encepalopathy P:   Provide supportive care Avoid sedating meds as able  Lights on during the day Promote normal sleep/wake cycle      Lucie Leather, M.D.  [facility]  Medical Director [facility] Medical Director [facility] Cardio-Pulmonary Department

## 2018-04-07 NOTE — Progress Notes (Signed)
Per ICU nurse pt needs to be NPO after midnight for abdominal ultrasound.

## 2018-04-07 NOTE — H&P (Signed)
[employer] - [place] at [facility]   PATIENT NAME: John Lawson    MR#:  [id number]  DATE OF BIRTH:  1970/11/25  DATE OF ADMISSION:  04/07/2018  PRIMARY CARE PHYSICIAN: [facility]   REQUESTING/REFERRING PHYSICIAN: Darci Current, MD  CHIEF COMPLAINT:   Chief Complaint  Patient presents with  . Altered Mental Status    HISTORY OF PRESENT ILLNESS:  John Lawson  is a 47 y.o. male with a known history of uncontrolled T2IDDM (w/ nephropathy, neuropathy and vascular disease, s/p toe amputations), HTN, HLD, CKD III, CHF (EF 50-55% as of 02/12/2018 Echo), chronic B/L LE edema p/w AMS. Pt is lethargic, but arousable, and is able to answer questions appropriately. He is AAOx3. He endorses severe fatigue/malaise, generalized weakness, body aches, joint pains and myalgias. He requests that I obtain the Hx from his wife at bedside. She tells me that pt has had a raspy voice/throat x1wk. She tells me that he has had a number of hospitalizations this year due to diabetes and complications thereof. She notes that he most recently had a R 2nd toe amputation in 09/2017; surgical site has healed well. She tells me that since that hospitalization, pt has had issues with volume overload and edema/anasarca. She states he is on Torsemide, and had been taking it properly for the past 1wk (but did not take it the week before that). On Monday 09/30 evening, pt slept in the recliner, which is normal for him. He woke up in the middle of the night complaining of "pain all over". (+) cramps, myalgias, body aches, joint pain. He went to sleep in his bed @~0430-0500AM on Tuesday 10/01. He had a medical home visit around midday on Tuesday 10/01. I am told he ate dinner that night, but had poor intake. His wife works at night, and went to work. She called in the middle of the shift to check on him, but he did not pick up the phone. She came back home @~0200AM on Tuesday  04/07/2018, and found him slumped over the table/counter with his head down. She states he grey/ashen and cold, but paradoxically diaphoretic. She sat him up in a chair and called EMS. EMS noted a weak (but palpable) pulse, and somnolence/confusion. In the ED, he is septic, hypotensive and hypothermic, but has improved with regards to mental status, per wife. Lactate 5.1. CT chest demonstrates, "Moderate right pleural effusion, similar in size compared to radiographs over the past 2 months. Compressive atelectasis in the right lung, with additional multifocal atelectasis and or scarring throughout all lobes of both lungs." At the time of my assessment, pt is verbal, appropriate. He is not in distress, but does appear toxic. He is admitted to [facility]. Family made aware of high risk of mortality.  PAST MEDICAL HISTORY:   Past Medical History:  Diagnosis Date  . Chronic diastolic CHF (congestive heart failure) (HCC)   . Diabetes mellitus without complication (HCC)   . Hypertension     PAST SURGICAL HISTORY:   Past Surgical History:  Procedure Laterality Date  . SKIN DEBRIDEMENT    . SPHINCTEROTOMY    . TOE AMPUTATION      SOCIAL HISTORY:   Social History   Tobacco Use  . Smoking status: Never Smoker  . Smokeless tobacco: Never Used  Substance Use Topics  . Alcohol use: Never    Frequency: Never    FAMILY HISTORY:   Family History  Problem Relation Age of Onset  .  Diabetes Mother   . Cancer Mother   . Thyroid disease Mother   . Diabetes Father   . Diabetes Sister     DRUG ALLERGIES:   Allergies  Allergen Reactions  . Tylenol [Acetaminophen] Nausea And Vomiting    Takes Percocet at home    REVIEW OF SYSTEMS:   Review of Systems  Unable to perform ROS: Mental status change  Constitutional: Positive for chills, diaphoresis and malaise/fatigue. Negative for fever.  HENT: Negative for congestion, sinus pain and sore throat.   Eyes: Negative for blurred vision.    Respiratory: Positive for shortness of breath. Negative for cough, sputum production and wheezing.   Cardiovascular: Positive for chest pain (+) "Pain all over". Negative for palpitations, orthopnea, claudication, leg swelling and PND.  Gastrointestinal: Positive for abdominal pain (+) "Pain all over". Negative for blood in stool, constipation, diarrhea, heartburn, melena, nausea and vomiting.  Genitourinary: Negative for dysuria, frequency, hematuria and urgency.  Musculoskeletal: Positive for back pain (+) "Pain all over", joint pain (+) "Pain all over", myalgias (+) "Pain all over" and neck pain (+) "Pain all over". Negative for falls.  Skin: Negative for rash.  Neurological: Positive for loss of consciousness and weakness.   Partial ROS 2/2 AMS. MEDICATIONS AT HOME:   Prior to Admission medications   Medication Sig Start Date End Date Taking? Authorizing Provider  albuterol (PROVENTIL HFA;VENTOLIN HFA) 108 (90 Base) MCG/ACT inhaler Inhale 2 puffs into the lungs every 6 (six) hours as needed for wheezing.   Yes [provider]  gabapentin (NEURONTIN) 800 MG tablet Take 800 mg by mouth 3 (three) times daily.   Yes [provider]  insulin glargine (LANTUS) 100 UNIT/ML injection Inject 30 Units into the skin at bedtime.   Yes [provider]  ipratropium-albuterol (DUONEB) 0.5-2.5 (3) MG/3ML SOLN Take 3 mLs by nebulization every 4 (four) hours as needed. 02/27/18 02/27/19 Yes Pyreddy, Vivien Rota, MD  metoprolol tartrate (LOPRESSOR) 25 MG tablet Take 1 tablet (25 mg total) by mouth 2 (two) times daily. 02/27/18 04/07/18 Yes Pyreddy, Vivien Rota, MD  omeprazole (PRILOSEC) 20 MG capsule Take 40 mg by mouth daily.    Yes [provider]  Oxycodone HCl 10 MG TABS Take 1 tablet by mouth 3 (three) times daily. 03/25/18  Yes [provider]  spironolactone (ALDACTONE) 25 MG tablet Take 25 mg by mouth daily.   Yes [provider]  torsemide (DEMADEX) 20 MG tablet  Take 2 tablets (40 mg total) by mouth 2 (two) times daily. Patient taking differently: Take 80 mg by mouth 2 (two) times daily.  02/27/18 04/07/18 Yes Pyreddy, Vivien Rota, MD  traZODone (DESYREL) 50 MG tablet Take 50 mg by mouth at bedtime as needed for sleep.    Yes [provider]  insulin aspart (NOVOLOG) 100 UNIT/ML injection Inject 0-20 Units into the skin 3 (three) times daily before meals.    [provider]      VITAL SIGNS:  Blood pressure 135/73, pulse 74, temperature (!) 90.2 F (32.3 C), resp. rate 11, height 6\' 2"  (1.88 m), weight 134.8 kg, SpO2 98 %.  PHYSICAL EXAMINATION:  Physical Exam  Constitutional: He is oriented to person, place, and time. He appears well-developed and well-nourished. He is active and cooperative. He appears toxic. He has a sickly appearance. He appears ill. No distress. He is not intubated.  HENT:  Head: Normocephalic and atraumatic.  Mouth/Throat: Oropharynx is clear and moist. No oropharyngeal exudate.  Eyes: Conjunctivae, EOM and lids are  normal. No scleral icterus.  Neck: Neck supple. No JVD present. No thyromegaly present.  Cardiovascular: Normal rate, regular rhythm, S1 normal, S2 normal and normal heart sounds.  No extrasystoles are present. Exam reveals no gallop, no S3, no S4, no distant heart sounds and no friction rub.  No murmur heard. Pulmonary/Chest: Effort normal. No accessory muscle usage or stridor. No apnea, no tachypnea and no bradypnea. He is not intubated. No respiratory distress. He has no decreased breath sounds. He has no wheezes. He has no rhonchi. He has rales in the right lower field.  Abdominal: Soft. He exhibits no distension. There is no tenderness. There is no rebound and no guarding.  Musculoskeletal: Normal range of motion. He exhibits no edema or tenderness.  Lymphadenopathy:    He has no cervical adenopathy.  Neurological: He is alert and oriented to person, place, and time.  Skin: Skin is warm. No rash  noted. He is diaphoretic. No erythema.  Psychiatric: He has a normal mood and affect. His behavior is normal. Judgment and thought content normal.     LABORATORY PANEL:   CBC Recent Labs  Lab 04/07/18 0336  WBC 16.7*  HGB 10.7*  HCT 33.0*  PLT 301   ------------------------------------------------------------------------------------------------------------------  Chemistries  Recent Labs  Lab 04/07/18 0336 04/07/18 0457  NA 138  --   K 4.7  --   CL 100  --   CO2 25  --   GLUCOSE 400*  --   BUN 34*  --   CREATININE 2.23*  --   CALCIUM 8.0*  --   MG  --  2.0  AST 31  --   ALT 21  --   ALKPHOS 134*  --   BILITOT 0.5  --    ------------------------------------------------------------------------------------------------------------------  Cardiac Enzymes Recent Labs  Lab 04/07/18 0336  TROPONINI <0.03   ------------------------------------------------------------------------------------------------------------------  RADIOLOGY:  Ct Head Wo Contrast  Result Date: 04/07/2018 CLINICAL DATA:  Confusion.  Unresponsive. EXAM: CT HEAD WITHOUT CONTRAST TECHNIQUE: Contiguous axial images were obtained from the base of the skull through the vertex without intravenous contrast. COMPARISON:  None. FINDINGS: Brain: No intracranial hemorrhage, mass effect, or midline shift. No hydrocephalus. The basilar cisterns are patent. No evidence of territorial infarct or acute ischemia. No extra-axial or intracranial fluid collection. Vascular: No hyperdense vessel. Skull: Normal. Negative for fracture or focal lesion. Sinuses/Orbits: Paranasal sinuses and mastoid air cells are clear. The visualized orbits are unremarkable. Other: None. IMPRESSION: Negative head CT. Electronically Signed   By: Narda Rutherford M.D.   On: 04/07/2018 05:01   Ct Chest Wo Contrast  Result Date: 04/07/2018 CLINICAL DATA:  Acute respiratory illness.  Shortness of breath. EXAM: CT CHEST WITHOUT CONTRAST TECHNIQUE:  Multidetector CT imaging of the chest was performed following the standard protocol without IV contrast. COMPARISON:  Radiograph earlier this day, additional priors. Chest CT 10/24/2014 FINDINGS: Cardiovascular: Multi chamber cardiomegaly. Small pericardial effusion. Normal caliber thoracic aorta. Mediastinum/Nodes: Multiple small mediastinal lymph nodes. Increased number of small bilateral axillary nodes, all subcentimeter. Limited assessment for hilar adenopathy given lack of IV contrast. Patulous esophagus with intraluminal fluid. Enlarged right lobe of the thyroid as before. Lungs/Pleura: Moderate layering right pleural effusion. Adjacent compressive atelectasis. Linear and streaky opacities throughout all lobes of both lungs, most significant in the dependent left lower lobe, favoring atelectasis or scarring. Trachea and mainstem bronchi are patent. No evidence pulmonary edema. Upper Abdomen: Stomach is distended with ingested contents. No acute findings. Musculoskeletal: There are no acute or  suspicious osseous abnormalities. IMPRESSION: 1. Moderate right pleural effusion, similar in size compared to radiographs over the past 2 months. 2. Compressive atelectasis in the right lung, with additional multifocal atelectasis and or scarring throughout all lobes of both lungs. 3. Multi chamber cardiomegaly with small pericardial effusion. 4. Patulous fluid-filled esophagus, suggesting reflux. Electronically Signed   By: Narda Rutherford M.D.   On: 04/07/2018 06:33   Dg Chest Portable 1 View  Result Date: 04/07/2018 CLINICAL DATA:  Confusion. EXAM: PORTABLE CHEST 1 VIEW COMPARISON:  Chest radiograph 02/14/2018 FINDINGS: Low lung volumes. Cardiomegaly is unchanged. Slight increase in right pleural effusion and basilar opacity. Improved pulmonary edema, residual vascular congestion. No pneumothorax. Unchanged osseous structures. IMPRESSION: Improved pulmonary edema with residual vascular congestion. Slight worsening  right pleural effusion and basilar opacity, likely atelectasis. Electronically Signed   By: Narda Rutherford M.D.   On: 04/07/2018 04:49   IMPRESSION AND PLAN:   A/P: 62M w/ unctrl T2IDDM p/w SIRS/sepsis, infectious source unknown, possible RLL pneumonia + parapneumonic effusion (vs. atelectasis). Hyperglycemia/glycosuria (w/ uncontrolled T2IDDM), AKI on CKD III, hypocalcemia, hypoalbuminemia, lactate elevation, leukocytosis, normocytic anemia. -SIRS/sepsis, possible RLL pneumonia/effusion: Pt p/w hypotension, hypothermia, WBC 16.7. SIRS (+). CT chest (+) R effusion + atelectasis/pneumonia. Suspected sepsis 2/2 suspect RLL pneumonia. Resides at [facility], concern for acute bacterial healthcare acquired pneumonia. Lactate 5.1., however PCT < 0.10. Broad-spectrum ABx (Vanc + Zosyn). BCx. Sputum/gram, UStrep + ULegionella Ag pending. Lawyer. Admit to Stepdown, cardiac monitoring + pulse ox. If the R chest effusion fluid volume is sufficient for diagnostic thoracentesis, I believe this should be performed to evaluate for LDH, protein and pH. -Hyperglycemia/glycosuria, unctrl T2IDDM: SSI, Lantus. Hold PO antihyperglycemics. -AKI, CKD III: Cr 2.23 on present admission, increased from 1.51 (02/27/2018). Suspect prerenal AKI superimposed on baseline CKD III (2/2 DM, HTN). EF 50-55% as of last available Echo (02/22/2018), however given Hx of CHF and volume overload, I have not administered additional IVF as yet; will obtain limited reevaluaton of ejection fraction. Monitor BMP, avoid nephrotoxins. -Hypocalcemia: Ionized calcium. -Hypoalbuminemia: Prealbumin. -Lactate elevation: 2/2 sepsis vs. Hypoperfusion. Appears to demonstrate characteristics of septic and cardiogenic shock. s/p IVF in ED. Echo as above. Mgmt of sepsis as above. -Normocytic anemia: Hgb 10.7, stable. Likely anemia of chronic disease. No evidence of acute blood loss at present. -FEN/GI: Renal diabetic free water restricted diet. -DVT  PPx: Heparin. -Code status: Full code. -Disposition: Admission, > 2 midnights.   All the records are reviewed and case discussed with ED provider. Management plans discussed with the patient, family and they are in agreement.  CODE STATUS: Full code.  TOTAL TIME TAKING CARE OF THIS PATIENT: 90 minutes.    Barbaraann Rondo M.D on 04/07/2018 at 8:51 AM  Between 7am to 6pm - Pager - [phone]  After 6pm go to [email or web address] - Scientist, research (life sciences) [facility]  Office  [phone]  CC: Primary care physician; [facility]   Note: This dictation was prepared with Nurse, children's dictation along with smaller phrase technology. Any transcriptional errors that result from this process are unintentional.

## 2018-04-07 NOTE — Progress Notes (Signed)
Pt transferred to room #210 via bed on monitor. Pt alert and oriented. Denies pain or discomfort at this time. All belongings with pt.

## 2018-04-07 NOTE — Progress Notes (Signed)
CODE SEPSIS - PHARMACY COMMUNICATION  **Broad Spectrum Antibiotics should be administered within 1 hour of Sepsis diagnosis**  Time Code Sepsis Called/Page Received: 10/02 0423  Antibiotics Ordered: 10/02 0421  Time of 1st antibiotic administration: 10/02 0509  Additional action taken by pharmacy:   If necessary, Name of Provider/Nurse Contacted:     Erich Montane ,PharmD Clinical Pharmacist  04/07/2018  6:19 AM

## 2018-04-07 NOTE — ED Provider Notes (Signed)
[facility] Emergency Department Provider Note _____________________________   First MD Initiated Contact with Patient 04/07/18 [phone]     (approximate)  I have reviewed the triage vital signs and the nursing notes.  Level 5 caveat: History and physical exam limited secondary to altered mental status. HISTORY  Chief Complaint Altered Mental Status    HPI John Lawson is a 47 y.o. male with below list of chronic medical conditions including CHF diabetes and hypertension presents to the emergency department via EMS with history of being found unresponsive by his wife.  Patient's wife states that she saw her husband at 10:00 and then subsequently on returning home found him face down on the kitchen counter unresponsive.  She states that she began chest compressions however she did not feel for pulse.  On please arrival she states that the officer states that his pulse was weak.  On EMS arrival patient with a palpable pulse however confused.  Patient presents to the emergency department in much the same state somnolent with confusion.  Patient states "I will hurt all over".  Past Medical History:  Diagnosis Date  . Chronic diastolic CHF (congestive heart failure) (HCC)   . Diabetes mellitus without complication (HCC)   . Hypertension     Patient Active Problem List   Diagnosis Date Noted  . Sepsis (HCC) 04/07/2018  . Lymphedema 03/18/2018  . Snoring 03/18/2018  . CHF (congestive heart failure) (HCC) 02/13/2018  . Acute on chronic diastolic CHF (congestive heart failure) (HCC) 02/12/2018  . HTN (hypertension) 02/12/2018  . Diabetes (HCC) 02/12/2018    Past Surgical History:  Procedure Laterality Date  . SKIN DEBRIDEMENT    . SPHINCTEROTOMY    . TOE AMPUTATION      Prior to Admission medications   Medication Sig Start Date End Date Taking? Authorizing Provider  albuterol (PROVENTIL HFA;VENTOLIN HFA) 108 (90 Base) MCG/ACT inhaler Inhale 2 puffs into the  lungs every 6 (six) hours as needed for wheezing.   Yes [provider]  gabapentin (NEURONTIN) 800 MG tablet Take 800 mg by mouth 3 (three) times daily.   Yes [provider]  insulin glargine (LANTUS) 100 UNIT/ML injection Inject 30 Units into the skin at bedtime.   Yes [provider]  ipratropium-albuterol (DUONEB) 0.5-2.5 (3) MG/3ML SOLN Take 3 mLs by nebulization every 4 (four) hours as needed. 02/27/18 02/27/19 Yes Pyreddy, Vivien Rota, MD  metoprolol tartrate (LOPRESSOR) 25 MG tablet Take 1 tablet (25 mg total) by mouth 2 (two) times daily. 02/27/18 04/07/18 Yes Pyreddy, Vivien Rota, MD  omeprazole (PRILOSEC) 20 MG capsule Take 40 mg by mouth daily.    Yes [provider]  Oxycodone HCl 10 MG TABS Take 1 tablet by mouth 3 (three) times daily. 03/25/18  Yes [provider]  spironolactone (ALDACTONE) 25 MG tablet Take 25 mg by mouth daily.   Yes [provider]  torsemide (DEMADEX) 20 MG tablet Take 2 tablets (40 mg total) by mouth 2 (two) times daily. Patient taking differently: Take 80 mg by mouth 2 (two) times daily.  02/27/18 04/07/18 Yes Pyreddy, Vivien Rota, MD  traZODone (DESYREL) 50 MG tablet Take 50 mg by mouth at bedtime as needed for sleep.    Yes [provider]  insulin aspart (NOVOLOG) 100 UNIT/ML injection Inject 0-20 Units into the skin 3 (three) times daily before meals.    [provider]    Allergies Tylenol [acetaminophen]  Family History  Problem Relation Age of Onset  . Diabetes  Mother   . Cancer Mother   . Thyroid disease Mother   . Diabetes Father   . Diabetes Sister     Social History Social History   Tobacco Use  . Smoking status: Never Smoker  . Smokeless tobacco: Never Used  Substance Use Topics  . Alcohol use: Never    Frequency: Never  . Drug use: Never    Review of Systems Constitutional: No fever/chills Eyes: No visual changes. ENT: No sore throat. Cardiovascular: Denies chest  pain. Respiratory: Denies shortness of breath. Gastrointestinal: No abdominal pain.  No nausea, no vomiting.  No diarrhea.  No constipation. Genitourinary: Negative for dysuria. Musculoskeletal: Negative for neck pain.  Negative for back pain. Integumentary: Negative for rash. Neurological: Positive for confusion.   ____________________________________________   PHYSICAL EXAM:  VITAL SIGNS: ED Triage Vitals  Enc Vitals Group     BP 04/07/18 0326 136/63     Pulse Rate 04/07/18 0326 89     Resp 04/07/18 0326 20     Temp 04/07/18 0326 (!) 93.9 F (34.4 C)     Temp Source 04/07/18 0326 Oral     SpO2 04/07/18 0326 92 %     Weight 04/07/18 0328 127 kg (280 lb)     Height 04/07/18 0328 1.88 m (6\' 2" )     Head Circumference --      Peak Flow --      Pain Score 04/07/18 0327 0     Pain Loc --      Pain Edu? --      Excl. in GC? --     Constitutional: Somnolent but aroused by verbal stimuli.  Confusion. Eyes: Conjunctivae are normal. PERRL. EOMI. Head: Atraumatic. Mouth/Throat: Mucous membranes are dry.  Oropharynx non-erythematous. Neck: No stridor.  No meningeal signs.   Cardiovascular: Normal rate, regular rhythm. Good peripheral circulation. Grossly normal heart sounds. Respiratory: Normal respiratory effort.  No retractions.  Bibasilar rhonchi Gastrointestinal: Soft and nontender. No distention.  Musculoskeletal: No lower extremity tenderness nor edema. No gross deformities of extremities. Neurologic:  Normal speech and language. No gross focal neurologic deficits are appreciated.  Skin:  Skin is warm, dry and intact. No rash noted.   ____________________________________________   LABS (all labs ordered are listed, but only abnormal results are displayed)  Labs Reviewed  CBC WITH DIFFERENTIAL/PLATELET - Abnormal; Notable for the following components:      Result Value   WBC 16.7 (*)    RBC 4.02 (*)    Hemoglobin 10.7 (*)    HCT 33.0 (*)    RDW 16.3 (*)    Neutro  Abs 14.9 (*)    Lymphs Abs 0.9 (*)    All other components within normal limits  COMPREHENSIVE METABOLIC PANEL - Abnormal; Notable for the following components:   Glucose, Bld 400 (*)    BUN 34 (*)    Creatinine, Ser 2.23 (*)    Calcium 8.0 (*)    Albumin 3.1 (*)    Alkaline Phosphatase 134 (*)    GFR calc non Af Amer 33 (*)    GFR calc Af Amer 38 (*)    All other components within normal limits  URINALYSIS, COMPLETE (UACMP) WITH MICROSCOPIC - Abnormal; Notable for the following components:   Color, Urine YELLOW (*)    APPearance HAZY (*)    Glucose, UA >=500 (*)    Protein, ur 100 (*)    Bacteria, UA RARE (*)    All other components within normal limits  URINE DRUG SCREEN, QUALITATIVE ([facility] ONLY) - Abnormal; Notable for the following components:   Opiate, Ur Screen POSITIVE (*)    All other components within normal limits  LACTIC ACID, PLASMA - Abnormal; Notable for the following components:   Lactic Acid, Venous 5.1 (*)    All other components within normal limits  LACTIC ACID, PLASMA - Abnormal; Notable for the following components:   Lactic Acid, Venous 2.0 (*)    All other components within normal limits  GLUCOSE, CAPILLARY - Abnormal; Notable for the following components:   Glucose-Capillary 374 (*)    All other components within normal limits  PHOSPHORUS - Abnormal; Notable for the following components:   Phosphorus 6.1 (*)    All other components within normal limits  CULTURE, BLOOD (ROUTINE X 2)  CULTURE, BLOOD (ROUTINE X 2)  URINE CULTURE  TROPONIN I  BRAIN NATRIURETIC PEPTIDE  ETHANOL  MAGNESIUM  PROCALCITONIN  CALCIUM, IONIZED  PREALBUMIN  FUNGITELL, SERUM  TSH  CBG MONITORING, ED   ____________________________________________  EKG  ED ECG REPORT I, [place] N Ewel Lona, the attending physician, personally viewed and interpreted this ECG.   Date: 04/07/2018  EKG Time: 3:24 AM  rate: 90  Rhythm: Normal sinus rhythm  Axis: Normal  Intervals: Normal   ST&T Change: None  ____________________________________________  RADIOLOGY I, [place] Ardell Aaronson, personally viewed and evaluated these images (plain radiographs) as part of my medical decision making, as well as reviewing the written report by the radiologist.  ED MD interpretation: CT head revealed no acute intracranial abnormality.  Chest x-ray: Right pleural effusion with basilar opacity.    Official radiology report(s): Ct Head Wo Contrast  Result Date: 04/07/2018 CLINICAL DATA:  Confusion.  Unresponsive. EXAM: CT HEAD WITHOUT CONTRAST TECHNIQUE: Contiguous axial images were obtained from the base of the skull through the vertex without intravenous contrast. COMPARISON:  None. FINDINGS: Brain: No intracranial hemorrhage, mass effect, or midline shift. No hydrocephalus. The basilar cisterns are patent. No evidence of territorial infarct or acute ischemia. No extra-axial or intracranial fluid collection. Vascular: No hyperdense vessel. Skull: Normal. Negative for fracture or focal lesion. Sinuses/Orbits: Paranasal sinuses and mastoid air cells are clear. The visualized orbits are unremarkable. Other: None. IMPRESSION: Negative head CT. Electronically Signed   By: Narda Rutherford M.D.   On: 04/07/2018 05:01   Ct Chest Wo Contrast  Result Date: 04/07/2018 CLINICAL DATA:  Acute respiratory illness.  Shortness of breath. EXAM: CT CHEST WITHOUT CONTRAST TECHNIQUE: Multidetector CT imaging of the chest was performed following the standard protocol without IV contrast. COMPARISON:  Radiograph earlier this day, additional priors. Chest CT 10/24/2014 FINDINGS: Cardiovascular: Multi chamber cardiomegaly. Small pericardial effusion. Normal caliber thoracic aorta. Mediastinum/Nodes: Multiple small mediastinal lymph nodes. Increased number of small bilateral axillary nodes, all subcentimeter. Limited assessment for hilar adenopathy given lack of IV contrast. Patulous esophagus with intraluminal fluid.  Enlarged right lobe of the thyroid as before. Lungs/Pleura: Moderate layering right pleural effusion. Adjacent compressive atelectasis. Linear and streaky opacities throughout all lobes of both lungs, most significant in the dependent left lower lobe, favoring atelectasis or scarring. Trachea and mainstem bronchi are patent. No evidence pulmonary edema. Upper Abdomen: Stomach is distended with ingested contents. No acute findings. Musculoskeletal: There are no acute or suspicious osseous abnormalities. IMPRESSION: 1. Moderate right pleural effusion, similar in size compared to radiographs over the past 2 months. 2. Compressive atelectasis in the right lung, with additional multifocal atelectasis and or scarring throughout all lobes of both lungs. 3.  Multi chamber cardiomegaly with small pericardial effusion. 4. Patulous fluid-filled esophagus, suggesting reflux. Electronically Signed   By: Narda Rutherford M.D.   On: 04/07/2018 06:33   Dg Chest Portable 1 View  Result Date: 04/07/2018 CLINICAL DATA:  Confusion. EXAM: PORTABLE CHEST 1 VIEW COMPARISON:  Chest radiograph 02/14/2018 FINDINGS: Low lung volumes. Cardiomegaly is unchanged. Slight increase in right pleural effusion and basilar opacity. Improved pulmonary edema, residual vascular congestion. No pneumothorax. Unchanged osseous structures. IMPRESSION: Improved pulmonary edema with residual vascular congestion. Slight worsening right pleural effusion and basilar opacity, likely atelectasis. Electronically Signed   By: Narda Rutherford M.D.   On: 04/07/2018 04:49    ____________________________________________   PROCEDURES  Critical Care performed:    .Critical Care Performed by: Darci Current, MD Authorized by: Darci Current, MD   Critical care provider statement:    Critical care time (minutes):  45   Critical care time was exclusive of:  Separately billable procedures and treating other patients and teaching time   Critical care  was time spent personally by me on the following activities:  Development of treatment plan with patient or surrogate, discussions with consultants, evaluation of patient's response to treatment, examination of patient, obtaining history from patient or surrogate, ordering and performing treatments and interventions, ordering and review of laboratory studies, ordering and review of radiographic studies, pulse oximetry, re-evaluation of patient's condition and review of old charts   I assumed direction of critical care for this patient from another provider in my specialty: no       ____________________________________________   INITIAL IMPRESSION / ASSESSMENT AND PLAN / ED COURSE  As part of my medical decision making, I reviewed the following data within the electronic MEDICAL RECORD NUMBER   47 year old male presenting with above-stated history and physical exam concerning for possible sepsis given hypothermia, hypoxia, leukocytosis and elevated lactic acid of 5.1.  However consider the possibility of ACS even pre-arrival history.  Patient's EKG revealed no evidence of ischemia or infarction troponin negative thus far.  Patient received appropriate broad-spectrum antibiotic therapy.  Patient discussed with Dr. Bosie Helper for hospital admission further evaluation and management _________________________  FINAL CLINICAL IMPRESSION(S) / ED DIAGNOSES  Final diagnoses:  Sepsis, due to unspecified organism, unspecified whether acute organ dysfunction present [facility])     MEDICATIONS GIVEN DURING THIS VISIT:  Medications  fentaNYL (SUBLIMAZE) injection 12.5-25 mcg (has no administration in time range)  vancomycin (VANCOCIN) 1,500 mg in sodium chloride 0.9 % 500 mL IVPB (has no administration in time range)  piperacillin-tazobactam (ZOSYN) IVPB 3.375 g (has no administration in time range)  furosemide (LASIX) injection 40 mg (40 mg Intravenous Given 04/07/18 0507)  piperacillin-tazobactam (ZOSYN) IVPB  3.375 g (0 g Intravenous Stopped 04/07/18 0539)  vancomycin (VANCOCIN) IVPB 1000 mg/200 mL premix (0 mg Intravenous Stopped 04/07/18 0559)     ED Discharge Orders    None       Note:  This document was prepared using Dragon voice recognition software and may include unintentional dictation errors.    Darci Current, MD 04/07/18 [phone]

## 2018-04-07 NOTE — ED Notes (Signed)
Pt to CT via stretcher accomp by CT tech 

## 2018-04-07 NOTE — Progress Notes (Signed)
Handoff report called to [place], RN on 2C.

## 2018-04-07 NOTE — ED Notes (Signed)
PCXR completed at bedside.

## 2018-04-07 NOTE — ED Notes (Signed)
hospitalist at bedside

## 2018-04-07 NOTE — Progress Notes (Signed)
Pharmacy Electrolyte Monitoring Consult:  Pharmacy consulted to assist in monitoring and replacing electrolytes in this 47 y.o. male admitted on 04/07/2018 with Altered Mental Status   Labs:  Sodium (mmol/L)  Date Value  04/07/2018 138  10/23/2014 132 (L)   Potassium (mmol/L)  Date Value  04/07/2018 4.7  10/23/2014 3.8   Magnesium (mg/dL)  Date Value  16/04/9603 2.0   Phosphorus (mg/dL)  Date Value  54/03/8118 6.1 (H)   Calcium (mg/dL)  Date Value  14/78/2956 8.0 (L)   Calcium, Total (mg/dL)  Date Value  21/30/8657 8.3 (L)   Albumin (g/dL)  Date Value  84/69/6295 3.1 (L)    Assessment/Plan: No supplementation required. Will order BMP with morning labs.  Pharmacy to follow and replace electrolytes per consult.  Pricilla Riffle, PharmD Pharmacy Resident  04/07/2018 3:42 PM

## 2018-04-07 NOTE — ED Notes (Signed)
Attempted to call report to floor, secretary st unable to take until after shift change

## 2018-04-07 NOTE — ED Notes (Signed)
Dr Manson Passey at bedside to discuss plan of care with pt's wife; pt resting quietly with eyes closed; resp even/unlab with no distress

## 2018-04-07 NOTE — ED Notes (Signed)
Pt alert to name only; confused to place or events leading up to transport here; pt denies c/o at present or recent illness; PERRL, grips = & strong, MAEW but generalized weakness noted; placed on card monitor; resp even/unlab, lungs clear, apical audible & regular, +BS, abd soft/distened/nontender; swelling to LE(s) bilat; EMS reports pt recently off lasix but has restarted

## 2018-04-07 NOTE — Progress Notes (Signed)
Patient admitted this morning for unresponsiveness and respiratory failure. -Admitted to ICU for possible sepsis, hypothermia -Cultures pending.  On broad-spectrum antibiotics -Pleural effusion on x-ray, consider thoracentesis -Management per ICU team

## 2018-04-07 NOTE — ED Notes (Addendum)
Pt to CT via stretcher accomp by CT tech; wife at bedside reports she came home from work approx 2am and found pt leaning forward on counter, diaphoretic but cool to touch and "drooling", pale color, unresponsive with decreased respiration rate noted; st she called 911 and began chest compressions until deputy arrived and stated he did have a weak pulse; st pt had been saying he was not feeling well recently; wife denies any hx of same

## 2018-04-07 NOTE — ED Notes (Signed)
Dr Manson Passey at bedside to assess pt; pt admits to taking 2 vicodin this evening; denies any recent illness; denies c/o at present

## 2018-04-07 NOTE — ED Triage Notes (Signed)
Pt to room 15 via EMS from home for unresponsiveness

## 2018-04-07 NOTE — Progress Notes (Signed)
Pharmacy Antibiotic Note  John Lawson is a 47 y.o. male admitted on 04/07/2018 with sepsis.  Pharmacy has been consulted for vancomycin and Zoayn dosing.  Plan: DW 103 kg  Vd 72L kei 0.053 hr-1  T1/2 13 hours Vancomycin 1500 mg q 18 hours ordered. Not candidate for stacked dosing. Level before 5th dose. Goal trough 15-20  Zosyn 3.375 grams q 8 hours ordered.  Height: 6\' 2"  (188 cm) Weight: 297 lb 2.9 oz (134.8 kg) IBW/kg (Calculated) : 82.2  Temp (24hrs), Avg:93 F (33.9 C), Min:90.6 F (32.6 C), Max:93.9 F (34.4 C)  Recent Labs  Lab 04/07/18 0336 04/07/18 0337  WBC 16.7*  --   CREATININE 2.23*  --   LATICACIDVEN  --  5.1*    Estimated Creatinine Clearance: 58.5 mL/min (A) (by C-G formula based on SCr of 2.23 mg/dL (H)).    Allergies  Allergen Reactions  . Tylenol [Acetaminophen] Nausea And Vomiting    Takes Percocet at home    Antimicrobials this admission:  vancomycin, Zosyn >>    >>   Dose adjustments this admission:   Microbiology results: 10/02 BCx: pending 10/02 UCx: pending      10/02 UA: LE(-)  NO2(-)  WBC 6-10 10/02 CXR: R opacity, likely atelactasis  Thank you for allowing pharmacy to be a part of this patient's care.  Kristyanna Barcelo S 04/07/2018 6:48 AM

## 2018-04-07 NOTE — ED Notes (Signed)
Pt is awake with wife at the bedside. Nurse explained that she was waiting on calling report to the floor.

## 2018-04-08 ENCOUNTER — Inpatient Hospital Stay: Payer: Medicaid Other

## 2018-04-08 LAB — URINE CULTURE: Culture: NO GROWTH

## 2018-04-08 LAB — GLUCOSE, CAPILLARY
GLUCOSE-CAPILLARY: 164 mg/dL — AB (ref 70–99)
Glucose-Capillary: 268 mg/dL — ABNORMAL HIGH (ref 70–99)
Glucose-Capillary: 130 mg/dL — ABNORMAL HIGH (ref 70–99)
Glucose-Capillary: 218 mg/dL — ABNORMAL HIGH (ref 70–99)

## 2018-04-08 LAB — BASIC METABOLIC PANEL
ANION GAP: 7 (ref 5–15)
BUN: 44 mg/dL — ABNORMAL HIGH (ref 6–20)
CALCIUM: 7.9 mg/dL — AB (ref 8.9–10.3)
CHLORIDE: 98 mmol/L (ref 98–111)
CO2: 30 mmol/L (ref 22–32)
Creatinine, Ser: 2.34 mg/dL — ABNORMAL HIGH (ref 0.61–1.24)
GFR calc Af Amer: 36 mL/min — ABNORMAL LOW (ref >60)
GFR calc non Af Amer: 31 mL/min — ABNORMAL LOW (ref >60)
GLUCOSE: 312 mg/dL — AB (ref 70–99)
Potassium: 5.5 mmol/L — ABNORMAL HIGH (ref 3.5–5.1)
Sodium: 135 mmol/L (ref 135–145)

## 2018-04-08 LAB — LEGIONELLA PNEUMOPHILA SEROGP 1 UR AG: L. PNEUMOPHILA SEROGP 1 UR AG: NEGATIVE

## 2018-04-08 LAB — CBC
HCT: 27.4 % — ABNORMAL LOW (ref 40.0–52.0)
Hemoglobin: 9.2 g/dL — ABNORMAL LOW (ref 13.0–18.0)
MCH: 27.3 pg (ref 26.0–34.0)
MCHC: 33.6 g/dL (ref 32.0–36.0)
MCV: 81.4 fL (ref 80.0–100.0)
PLATELETS: 249 10*3/uL (ref 150–440)
RBC: 3.37 MIL/uL — AB (ref 4.40–5.90)
RDW: 16.1 % — ABNORMAL HIGH (ref 11.5–14.5)
WBC: 7.9 10*3/uL (ref 3.8–10.6)

## 2018-04-08 LAB — CALCIUM, IONIZED: CALCIUM, IONIZED, SERUM: 4.3 mg/dL — AB (ref 4.5–5.6)

## 2018-04-08 LAB — PROCALCITONIN: Procalcitonin: 0.19 ng/mL

## 2018-04-08 MED ORDER — GABAPENTIN 400 MG PO CAPS
800.0000 mg | ORAL_CAPSULE | Freq: Three times a day (TID) | ORAL | Status: DC
  Administered 2018-04-08 – 2018-04-09 (×3): 800 mg via ORAL
  Filled 2018-04-08 (×3): qty 2

## 2018-04-08 MED ORDER — LIDOCAINE 5 % EX PTCH
1.0000 | MEDICATED_PATCH | Freq: Once | CUTANEOUS | Status: AC
  Administered 2018-04-08: 1 via TRANSDERMAL
  Filled 2018-04-08: qty 1

## 2018-04-08 MED ORDER — TRAZODONE HCL 50 MG PO TABS
50.0000 mg | ORAL_TABLET | Freq: Every evening | ORAL | Status: DC | PRN
  Filled 2018-04-08: qty 1

## 2018-04-08 MED ORDER — LIDOCAINE 5 % EX PTCH
2.0000 | MEDICATED_PATCH | CUTANEOUS | Status: DC
  Administered 2018-04-09: 2 via TRANSDERMAL
  Filled 2018-04-08: qty 2
  Filled 2018-04-08: qty 1

## 2018-04-08 MED ORDER — OXYCODONE HCL 5 MG PO TABS
10.0000 mg | ORAL_TABLET | Freq: Four times a day (QID) | ORAL | Status: DC | PRN
  Administered 2018-04-08 – 2018-04-09 (×4): 10 mg via ORAL
  Filled 2018-04-08 (×4): qty 2

## 2018-04-08 MED ORDER — INSULIN ASPART 100 UNIT/ML ~~LOC~~ SOLN
4.0000 [IU] | Freq: Three times a day (TID) | SUBCUTANEOUS | Status: DC
  Administered 2018-04-08 – 2018-04-09 (×3): 4 [IU] via SUBCUTANEOUS
  Filled 2018-04-08 (×3): qty 1

## 2018-04-08 MED ORDER — LIDOCAINE 5 % EX PTCH
1.0000 | MEDICATED_PATCH | CUTANEOUS | Status: DC
  Administered 2018-04-08: 1 via TRANSDERMAL
  Filled 2018-04-08: qty 1

## 2018-04-08 MED ORDER — GABAPENTIN 400 MG PO CAPS
400.0000 mg | ORAL_CAPSULE | Freq: Three times a day (TID) | ORAL | Status: DC
  Administered 2018-04-08: 400 mg via ORAL
  Filled 2018-04-08: qty 1

## 2018-04-08 MED ORDER — INSULIN GLARGINE 100 UNIT/ML ~~LOC~~ SOLN
30.0000 [IU] | Freq: Every day | SUBCUTANEOUS | Status: DC
  Administered 2018-04-08: 30 [IU] via SUBCUTANEOUS
  Filled 2018-04-08 (×2): qty 0.3

## 2018-04-08 MED ORDER — SODIUM POLYSTYRENE SULFONATE 15 GM/60ML PO SUSP
15.0000 g | Freq: Once | ORAL | Status: AC
  Administered 2018-04-08: 15 g via ORAL
  Filled 2018-04-08: qty 60

## 2018-04-08 MED ORDER — FUROSEMIDE 10 MG/ML IJ SOLN
40.0000 mg | Freq: Two times a day (BID) | INTRAMUSCULAR | Status: DC
  Administered 2018-04-08 – 2018-04-09 (×2): 40 mg via INTRAVENOUS
  Filled 2018-04-08 (×3): qty 4

## 2018-04-08 NOTE — Progress Notes (Signed)
[facility] - [place] at [facility]   PATIENT NAME: John Lawson    MR#:  [id number]  DATE OF BIRTH:  [birth year]  SUBJECTIVE:  CHIEF COMPLAINT:   Chief Complaint  Patient presents with  . Altered Mental Status   - admitted with unresponsive episode, feels some better now - still with renal failure - has chronic pain  REVIEW OF SYSTEMS:  Review of Systems  Constitutional: Negative for chills, fever and malaise/fatigue.  HENT: Negative for congestion, ear discharge, hearing loss and nosebleeds.   Eyes: Negative for blurred vision and double vision.  Respiratory: Negative for cough, shortness of breath and wheezing.   Cardiovascular: Negative for chest pain and palpitations.  Gastrointestinal: Negative for abdominal pain, constipation, diarrhea, nausea and vomiting.  Genitourinary: Negative for dysuria.  Musculoskeletal: Positive for back pain and myalgias.  Neurological: Negative for dizziness, focal weakness, seizures, weakness and headaches.  Psychiatric/Behavioral: Negative for depression.    DRUG ALLERGIES:   Allergies  Allergen Reactions  . Tylenol [Acetaminophen] Nausea And Vomiting    Takes Percocet at home    VITALS:  Blood pressure 120/70, pulse 71, temperature 98.8 F (37.1 C), temperature source Axillary, resp. rate 16, height 6\' 2"  (1.88 m), weight 134.8 kg, SpO2 96 %.  PHYSICAL EXAMINATION:  Physical Exam  GENERAL:  47 y.o.-year-old patient lying in the bed with no acute distress.  EYES: Pupils equal, round, reactive to light and accommodation. No scleral icterus. Extraocular muscles intact.  HEENT: Head atraumatic, normocephalic. Oropharynx and nasopharynx clear.  NECK:  Supple, no jugular venous distention. No thyroid enlargement, no tenderness.  LUNGS: Normal breath sounds bilaterally, no wheezing, rales,rhonchi or crepitation. No use of accessory muscles of respiration. Decreased bibasilar breath sounds CARDIOVASCULAR: S1, S2  normal. No murmurs, rubs, or gallops.  ABDOMEN: Soft, nontender, nondistended. Bowel sounds present. No organomegaly or mass.  EXTREMITIES: No pedal edema, cyanosis, or clubbing.  NEUROLOGIC: Cranial nerves II through XII are intact. Muscle strength 5/5 in all extremities. Sensation intact. Gait not checked.  PSYCHIATRIC: The patient is alert and oriented x 3.  SKIN: No obvious rash, lesion, or ulcer.    LABORATORY PANEL:   CBC Recent Labs  Lab 04/08/18 0446  WBC 7.9  HGB 9.2*  HCT 27.4*  PLT 249   ------------------------------------------------------------------------------------------------------------------  Chemistries  Recent Labs  Lab 04/07/18 0336 04/07/18 0457 04/08/18 0446  NA 138  --  135  K 4.7  --  5.5*  CL 100  --  98  CO2 25  --  30  GLUCOSE 400*  --  312*  BUN 34*  --  44*  CREATININE 2.23*  --  2.34*  CALCIUM 8.0*  --  7.9*  MG  --  2.0  --   AST 31  --   --   ALT 21  --   --   ALKPHOS 134*  --   --   BILITOT 0.5  --   --    ------------------------------------------------------------------------------------------------------------------  Cardiac Enzymes Recent Labs  Lab 04/07/18 0336  TROPONINI <0.03   ------------------------------------------------------------------------------------------------------------------  RADIOLOGY:  Ct Head Wo Contrast  Result Date: 04/07/2018 CLINICAL DATA:  Confusion.  Unresponsive. EXAM: CT HEAD WITHOUT CONTRAST TECHNIQUE: Contiguous axial images were obtained from the base of the skull through the vertex without intravenous contrast. COMPARISON:  None. FINDINGS: Brain: No intracranial hemorrhage, mass effect, or midline shift. No hydrocephalus. The basilar cisterns are patent. No evidence of territorial infarct or acute ischemia. No extra-axial or intracranial fluid  collection. Vascular: No hyperdense vessel. Skull: Normal. Negative for fracture or focal lesion. Sinuses/Orbits: Paranasal sinuses and mastoid air  cells are clear. The visualized orbits are unremarkable. Other: None. IMPRESSION: Negative head CT. Electronically Signed   By: Narda Rutherford M.D.   On: 04/07/2018 05:01   Ct Chest Wo Contrast  Result Date: 04/07/2018 CLINICAL DATA:  Acute respiratory illness.  Shortness of breath. EXAM: CT CHEST WITHOUT CONTRAST TECHNIQUE: Multidetector CT imaging of the chest was performed following the standard protocol without IV contrast. COMPARISON:  Radiograph earlier this day, additional priors. Chest CT 10/24/2014 FINDINGS: Cardiovascular: Multi chamber cardiomegaly. Small pericardial effusion. Normal caliber thoracic aorta. Mediastinum/Nodes: Multiple small mediastinal lymph nodes. Increased number of small bilateral axillary nodes, all subcentimeter. Limited assessment for hilar adenopathy given lack of IV contrast. Patulous esophagus with intraluminal fluid. Enlarged right lobe of the thyroid as before. Lungs/Pleura: Moderate layering right pleural effusion. Adjacent compressive atelectasis. Linear and streaky opacities throughout all lobes of both lungs, most significant in the dependent left lower lobe, favoring atelectasis or scarring. Trachea and mainstem bronchi are patent. No evidence pulmonary edema. Upper Abdomen: Stomach is distended with ingested contents. No acute findings. Musculoskeletal: There are no acute or suspicious osseous abnormalities. IMPRESSION: 1. Moderate right pleural effusion, similar in size compared to radiographs over the past 2 months. 2. Compressive atelectasis in the right lung, with additional multifocal atelectasis and or scarring throughout all lobes of both lungs. 3. Multi chamber cardiomegaly with small pericardial effusion. 4. Patulous fluid-filled esophagus, suggesting reflux. Electronically Signed   By: Narda Rutherford M.D.   On: 04/07/2018 06:33   US Abdomen Complete  Result Date: 04/08/2018 CLINICAL DATA:  Cirrhosis. EXAM: ABDOMEN ULTRASOUND COMPLETE COMPARISON:   Ultrasound 02/13/2018.  KUB 03/04/2017. FINDINGS: Gallbladder: Multiple small gallstones noted. No sonographic Murphy sign noted by sonographer. Common bile duct: Diameter: 6.4 mm Liver: Increased echogenicity consistent with fatty infiltration or hepatocellular disease. Portal vein is patent on color Doppler imaging with normal direction of blood flow towards the liver. IVC: No abnormality visualized. Pancreas: Visualized portion unremarkable. Spleen: Size and appearance within normal limits. Right Kidney: Length: 13.5 cm. Echogenicity within normal limits. 3.5 cm simple cyst. No hydronephrosis visualized. Left Kidney: Length: 12.8 cm. Echogenicity within normal limits. No mass or hydronephrosis visualized. Abdominal aorta: No aneurysm visualized. Other findings: Right pleural effusion noted. IMPRESSION: 1.  Gallstones.  No biliary distention. 2. Increased hepatic echogenicity consistent fatty infiltration or hepatocellular disease. 3.  Right pleural effusion. Electronically Signed   By: Maisie Fus  Register   On: 04/08/2018 10:05   Dg Chest Portable 1 View  Result Date: 04/07/2018 CLINICAL DATA:  Confusion. EXAM: PORTABLE CHEST 1 VIEW COMPARISON:  Chest radiograph 02/14/2018 FINDINGS: Low lung volumes. Cardiomegaly is unchanged. Slight increase in right pleural effusion and basilar opacity. Improved pulmonary edema, residual vascular congestion. No pneumothorax. Unchanged osseous structures. IMPRESSION: Improved pulmonary edema with residual vascular congestion. Slight worsening right pleural effusion and basilar opacity, likely atelectasis. Electronically Signed   By: Narda Rutherford M.D.   On: 04/07/2018 04:49    EKG:   Orders placed or performed during the hospital encounter of 04/07/18  . EKG 12-Lead  . EKG 12-Lead  . ED EKG  . ED EKG  . EKG    ASSESSMENT AND PLAN:   47 year old male with past medical history significant for insulin-dependent diabetes mellitus, hypertension, CKD, CHF, chronic  low back pain presents to hospital secondary to unresponsive episode  1.  Unresponsive episode-likely acute encephalopathy -Admitted  to ICU.  Patient is awake and alert at this time -Elevated procalcitonin.  Lactic acid was high on admission -WBC is elevated.  Cultures are negative so far -Sepsis has been ruled out.  Antibiotics discontinued.  2.  Acute on chronic renal failure- -Baseline Creatinine seems to be around 1.5.  Now at 2.3 -Also with pulmonary edema.  Monitor carefully while on Lasix -Nephrology consulted -Renal ultrasound 2 months ago showing medical renal disease.  3.  Acute diastolic heart failure-chest x-ray with pulmonary edema.  Currently requiring oxygen supplemental via nasal cannula -Echo shows EF of 50 to 55% -Started on IV Lasix for today.  Follow-up x-ray in a.m.  4.  Diabetes mellitus-on Lantus, NovoLog pre-meal insulin sliding scale  5.  Chronic low back pain-on oral pain medicines and Lidoderm patches  6.  DVT prophylaxis-subcu heparin  We will consult physical therapy.  Patient ambulates with a walker at home     All the records are reviewed and case discussed with Care Management/Social Workerr. Management plans discussed with the patient, family and they are in agreement.  CODE STATUS: Full Code  TOTAL TIME TAKING CARE OF THIS PATIENT: 38 minutes.   POSSIBLE D/C IN 2 DAYS, DEPENDING ON CLINICAL CONDITION.   Mikhayla Phillis M.D on 04/08/2018 at 2:45 PM  Between 7am to 6pm - Pager - [phone]  After 6pm go to [email or web address] - Social research officer, government  [facility]  Office  [phone]  CC: Primary care physician; [facility]

## 2018-04-08 NOTE — Progress Notes (Signed)
Inpatient Diabetes Program Recommendations  AACE/ADA: New Consensus Statement on Inpatient Glycemic Control (2019)  Target Ranges:  Prepandial:   less than 140 mg/dL      Peak postprandial:   less than 180 mg/dL (1-2 hours)      Critically ill patients:  140 - 180 mg/dL   Results for John Lawson, John Lawson (MRN [id number]) as of 04/08/2018 09:46  Ref. Range 04/07/2018 08:17 04/07/2018 11:44 04/07/2018 16:15 04/07/2018 21:05 04/08/2018 07:48  Glucose-Capillary Latest Ref Range: 70 - 99 mg/dL 119 (H) 147 (H) 829 (H) 196 (H) 268 (H)   Review of Glycemic Control  Diabetes history: DM2 Outpatient Diabetes medications: Lantus 30 units QHS, Novolog 0-20 units TID with meals Current orders for Inpatient glycemic control: Lantus 30 units QHS, Novolog 0-15 units TID with meals, Novolog 0-5 units QHS  Inpatient Diabetes Program Recommendations: Insulin - Meal Coverage: Once diet is ordered, please consider ordeirng Novolog 5 units TID with meals for meal coverage if patient eats at least 50% of meals.  NOTE: Patient received Lantus 20 units last night and fasting glucose 268 mg/dl today. Noted Lantus increased to 30 units QHS this morning. Currently patient is NPO for abdominal ultrasound. Once diet is ordered, recommend ordering additional Novolog for meal coverage.  Thanks, Orlando Penner, RN, MSN, CDE Diabetes Coordinator Inpatient Diabetes Program [phone] (Team Pager from 8am to 5pm)

## 2018-04-08 NOTE — Progress Notes (Signed)
Pt BS on labs 312. Nurse went into Pt's room and awoke him noting his BS and that it needs to be double check with glucometer Pt noted he wants to  sleep and when nurse insisted that it needs to be checked Pt told nurse to leave him to "F" alone.

## 2018-04-08 NOTE — Consult Note (Signed)
WOC Nurse wound consult note WOC team consulted on this pt yesterday for his toe and plantar surface wound on the right foot. Orders were placed for Aquacel Ag+ dressings, pt states this is what he uses at home. We will not follow, but will remain available to this patient, to nursing, and the medical and/or surgical teams.  Please re-consult if we need to assist further.  Barnett Hatter, RN-C, WTA-C Wound Treatment Associate

## 2018-04-09 ENCOUNTER — Inpatient Hospital Stay: Payer: Medicaid Other

## 2018-04-09 LAB — BASIC METABOLIC PANEL
Anion gap: 9 (ref 5–15)
BUN: 40 mg/dL — ABNORMAL HIGH (ref 6–20)
CHLORIDE: 99 mmol/L (ref 98–111)
CO2: 31 mmol/L (ref 22–32)
CREATININE: 1.62 mg/dL — AB (ref 0.61–1.24)
Calcium: 8.4 mg/dL — ABNORMAL LOW (ref 8.9–10.3)
GFR calc non Af Amer: 48 mL/min — ABNORMAL LOW (ref >60)
GFR, EST AFRICAN AMERICAN: 56 mL/min — AB (ref >60)
Glucose, Bld: 193 mg/dL — ABNORMAL HIGH (ref 70–99)
POTASSIUM: 5.2 mmol/L — AB (ref 3.5–5.1)
SODIUM: 139 mmol/L (ref 135–145)

## 2018-04-09 LAB — FUNGITELL, SERUM: Fungitell Result: 72 pg/mL (ref <80)

## 2018-04-09 LAB — GLUCOSE, CAPILLARY
GLUCOSE-CAPILLARY: 173 mg/dL — AB (ref 70–99)
Glucose-Capillary: 160 mg/dL — ABNORMAL HIGH (ref 70–99)

## 2018-04-09 LAB — PROCALCITONIN

## 2018-04-09 MED ORDER — FUROSEMIDE 40 MG PO TABS
40.0000 mg | ORAL_TABLET | Freq: Once | ORAL | Status: AC
  Administered 2018-04-09: 40 mg via ORAL
  Filled 2018-04-09: qty 1

## 2018-04-09 MED ORDER — TORSEMIDE 20 MG PO TABS: 80.0000 mg | ORAL_TABLET | Freq: Two times a day (BID) | ORAL | 0 refills | Status: AC

## 2018-04-09 MED ORDER — PATIROMER SORBITEX CALCIUM 8.4 G PO PACK
8.4000 g | PACK | Freq: Every day | ORAL | Status: DC
  Administered 2018-04-09: 8.4 g via ORAL
  Filled 2018-04-09: qty 1

## 2018-04-09 MED ORDER — PATIROMER SORBITEX CALCIUM 8.4 G PO PACK: 8.4000 g | PACK | Freq: Every day | ORAL | 0 refills | Status: AC

## 2018-04-09 MED ORDER — LIDOCAINE 5 % EX PTCH: 2.0000 | MEDICATED_PATCH | CUTANEOUS | 0 refills | Status: AC

## 2018-04-09 NOTE — Discharge Summary (Signed)
[facility] - [place] at [facility]   PATIENT NAME: John Lawson    MR#:  [id number]  DATE OF BIRTH:  [birth year]  DATE OF ADMISSION:  04/07/2018   ADMITTING PHYSICIAN: Barbaraann Rondo, MD  DATE OF DISCHARGE:  04/09/18  PRIMARY CARE PHYSICIAN: [facility]   ADMISSION DIAGNOSIS:   Sepsis, due to unspecified organism, unspecified whether acute organ dysfunction present (HCC) [A41.9]  DISCHARGE DIAGNOSIS:   Active Problems:   Sepsis (HCC)   SECONDARY DIAGNOSIS:   Past Medical History:  Diagnosis Date  . Chronic diastolic CHF (congestive heart failure) (HCC)   . Diabetes mellitus without complication (HCC)   . Hypertension     HOSPITAL COURSE:   47 year old male with past medical history significant for insulin-dependent diabetes mellitus, hypertension, CKD, CHF, chronic low back pain presents to hospital secondary to unresponsive episode  1.  Unresponsive episode-likely acute encephalopathy- takes pain meds at home -Admitted to ICU.  Patient is awake and alert at this time -Elevated procalcitonin.  Lactic acid was high on admission -WBC is elevated.  Cultures are negative so far -Sepsis has been ruled out.  Antibiotics discontinued.  2.  Acute on chronic renal failure- -Baseline Creatinine seems to be around 1.5.  worsened while off of diuretics- they have been restarted - now ar at 1.6 -received IV lasix- discharge on home dose of torsemide and aldactone -Nephrology consult as outpatient -Renal ultrasound 2 months ago showing medical renal disease.  3.  Acute diastolic heart failure-chest x-ray with pulmonary edema.  - required acute oxygen supplemental via nasal cannula -Echo shows EF of 50 to 55% - on IV Lasix, follow-up chest x-ray with improvement noted.  Also off oxygen at this time -Discharge on home dose of torsemide  4.  Diabetes mellitus-on Lantus, and sliding scale NovoLog at home  5.  Chronic low back  pain-on oral pain medicines and Lidoderm patches   Encourage ambulation prior to discharge.  PT has been consulted   DISCHARGE CONDITIONS:   Very guarded  CONSULTS OBTAINED:   Treatment Team:  Barbaraann Rondo, MD Mady Haagensen, MD  DRUG ALLERGIES:   Allergies  Allergen Reactions  . Tylenol [Acetaminophen] Nausea And Vomiting    Takes Percocet at home   DISCHARGE MEDICATIONS:   Allergies as of 04/09/2018      Reactions   Tylenol [acetaminophen] Nausea And Vomiting   Takes Percocet at home      Medication List    TAKE these medications   albuterol 108 (90 Base) MCG/ACT inhaler Commonly known as:  PROVENTIL HFA;VENTOLIN HFA Inhale 2 puffs into the lungs every 6 (six) hours as needed for wheezing.   gabapentin 800 MG tablet Commonly known as:  NEURONTIN Take 800 mg by mouth 3 (three) times daily.   insulin aspart 100 UNIT/ML injection Commonly known as:  novoLOG Inject 0-20 Units into the skin 3 (three) times daily before meals.   insulin glargine 100 UNIT/ML injection Commonly known as:  LANTUS Inject 30 Units into the skin at bedtime.   ipratropium-albuterol 0.5-2.5 (3) MG/3ML Soln Commonly known as:  DUONEB Take 3 mLs by nebulization every 4 (four) hours as needed.   lidocaine 5 % Commonly known as:  LIDODERM Place 2 patches onto the skin daily. Remove & Discard patch within 12 hours or as directed by MD   metoprolol tartrate 25 MG tablet Commonly known as:  LOPRESSOR Take 1 tablet (25 mg total) by mouth 2 (two) times daily.  omeprazole 20 MG capsule Commonly known as:  PRILOSEC Take 40 mg by mouth daily.   Oxycodone HCl 10 MG Tabs Take 1 tablet by mouth 3 (three) times daily.   patiromer 8.4 g packet Commonly known as:  VELTASSA Take 1 packet (8.4 g total) by mouth daily.   spironolactone 25 MG tablet Commonly known as:  ALDACTONE Take 25 mg by mouth daily.   torsemide 20 MG tablet Commonly known as:  DEMADEX Take 4 tablets (80 mg  total) by mouth 2 (two) times daily.   traZODone 50 MG tablet Commonly known as:  DESYREL Take 50 mg by mouth at bedtime as needed for sleep.        DISCHARGE INSTRUCTIONS:   1. PCP f/u in 1-2 weeks 2. Nephrology f/u in 1 week  DIET:   Cardiac diet  ACTIVITY:   Activity as tolerated  OXYGEN:   Home Oxygen: No.  Oxygen Delivery: room air  DISCHARGE LOCATION:   home   If you experience worsening of your admission symptoms, develop shortness of breath, life threatening emergency, suicidal or homicidal thoughts you must seek medical attention immediately by calling 911 or calling your MD immediately  if symptoms less severe.  You Must read complete instructions/literature along with all the possible adverse reactions/side effects for all the Medicines you take and that have been prescribed to you. Take any new Medicines after you have completely understood and accpet all the possible adverse reactions/side effects.   Please note  You were cared for by a hospitalist during your hospital stay. If you have any questions about your discharge medications or the care you received while you were in the hospital after you are discharged, you can call the unit and asked to speak with the hospitalist on call if the hospitalist that took care of you is not available. Once you are discharged, your primary care physician will handle any further medical issues. Please note that NO REFILLS for any discharge medications will be authorized once you are discharged, as it is imperative that you return to your primary care physician (or establish a relationship with a primary care physician if you do not have one) for your aftercare needs so that they can reassess your need for medications and monitor your lab values.    On the day of Discharge:  VITAL SIGNS:   Blood pressure 136/62, pulse 79, temperature 99.2 F (37.3 C), temperature source Oral, resp. rate 20, height 6\' 2"  (1.88 m), weight  134.8 kg, SpO2 100 %.  PHYSICAL EXAMINATION:   GENERAL:  47 y.o.-year-old patient lying in the bed with no acute distress.  EYES: Pupils equal, round, reactive to light and accommodation. No scleral icterus. Extraocular muscles intact.  HEENT: Head atraumatic, normocephalic. Oropharynx and nasopharynx clear.  NECK:  Supple, no jugular venous distention. No thyroid enlargement, no tenderness.  LUNGS: Normal breath sounds bilaterally, no wheezing, rales,rhonchi or crepitation. No use of accessory muscles of respiration. Decreased bibasilar breath sounds CARDIOVASCULAR: S1, S2 normal. No murmurs, rubs, or gallops.  ABDOMEN: Soft, nontender, nondistended. Bowel sounds present. No organomegaly or mass.  EXTREMITIES: No   cyanosis, or clubbing.  2+ bilateral lower extremity edema noted NEUROLOGIC: Cranial nerves II through XII are intact. Muscle strength 5/5 in all extremities. Sensation intact. Gait not checked.  PSYCHIATRIC: The patient is alert and oriented x 3.  SKIN: No obvious rash, lesion, or ulcer.   DATA REVIEW:   CBC Recent Labs  Lab 04/08/18 0446  WBC 7.9  HGB 9.2*  HCT 27.4*  PLT 249    Chemistries  Recent Labs  Lab 04/07/18 0336 04/07/18 0457  04/09/18 0752  NA 138  --    < > 139  K 4.7  --    < > 5.2*  CL 100  --    < > 99  CO2 25  --    < > 31  GLUCOSE 400*  --    < > 193*  BUN 34*  --    < > 40*  CREATININE 2.23*  --    < > 1.62*  CALCIUM 8.0*  --    < > 8.4*  MG  --  2.0  --   --   AST 31  --   --   --   ALT 21  --   --   --   ALKPHOS 134*  --   --   --   BILITOT 0.5  --   --   --    < > = values in this interval not displayed.     Microbiology Results  Results for orders placed or performed during the hospital encounter of 04/07/18  Urine culture     Status: None   Collection Time: 04/07/18  4:27 AM  Result Value Ref Range Status   Specimen Description   Final    URINE, RANDOM Performed at [facility], [address], [place],  [place] [address]    Special Requests   Final    NONE Performed at [facility], [address], [place], [place] [address]    Culture   Final    NO GROWTH Performed at [facility], [address], [place], [place] [address]    Report Status 04/08/2018 FINAL  Final  Blood Culture (routine x 2)     Status: None (Preliminary result)   Collection Time: 04/07/18  4:51 AM  Result Value Ref Range Status   Specimen Description BLOOD RIGHT HAND  Final   Special Requests   Final    BOTTLES DRAWN AEROBIC AND ANAEROBIC Blood Culture adequate volume   Culture   Final    NO GROWTH 2 DAYS Performed at [facility], [address] [place], [place] [address]    Report Status PENDING  Incomplete  Blood Culture (routine x 2)     Status: None (Preliminary result)   Collection Time: 04/07/18  4:57 AM  Result Value Ref Range Status   Specimen Description BLOOD LEFT HAND  Final   Special Requests   Final    BOTTLES DRAWN AEROBIC AND ANAEROBIC Blood Culture adequate volume   Culture   Final    NO GROWTH 2 DAYS Performed at [facility], [address], [place], [place] [address]    Report Status PENDING  Incomplete  MRSA PCR Screening     Status: None   Collection Time: 04/07/18  8:26 AM  Result Value Ref Range Status   MRSA by PCR NEGATIVE NEGATIVE Final    Comment:        The GeneXpert MRSA Assay (FDA approved for NASAL specimens only), is one component of a comprehensive MRSA colonization surveillance program. It is not intended to diagnose MRSA infection nor to guide or monitor treatment for MRSA infections. Performed at [facility], [address] [place], [place] [address]     RADIOLOGY:  Dg Chest 2 View  Result Date: 04/09/2018 CLINICAL DATA:  Sepsis, congestive heart failure. EXAM:  CHEST - 2 VIEW COMPARISON:  Radiographs and CT scan of April 07, 2018. FINDINGS: Stable cardiomediastinal silhouette. No pneumothorax is noted. Left lung is  clear. Mild right basilar atelectasis or infiltrate is noted with small right pleural effusion. Bony thorax is unremarkable. IMPRESSION: Mild right basilar subsegmental atelectasis or infiltrate is noted with small right pleural effusion. Electronically Signed   By: Lupita Raider, M.D.   On: 04/09/2018 08:22   US Abdomen Complete  Result Date: 04/08/2018 CLINICAL DATA:  Cirrhosis. EXAM: ABDOMEN ULTRASOUND COMPLETE COMPARISON:  Ultrasound 02/13/2018.  KUB 03/04/2017. FINDINGS: Gallbladder: Multiple small gallstones noted. No sonographic Murphy sign noted by sonographer. Common bile duct: Diameter: 6.4 mm Liver: Increased echogenicity consistent with fatty infiltration or hepatocellular disease. Portal vein is patent on color Doppler imaging with normal direction of blood flow towards the liver. IVC: No abnormality visualized. Pancreas: Visualized portion unremarkable. Spleen: Size and appearance within normal limits. Right Kidney: Length: 13.5 cm. Echogenicity within normal limits. 3.5 cm simple cyst. No hydronephrosis visualized. Left Kidney: Length: 12.8 cm. Echogenicity within normal limits. No mass or hydronephrosis visualized. Abdominal aorta: No aneurysm visualized. Other findings: Right pleural effusion noted. IMPRESSION: 1.  Gallstones.  No biliary distention. 2. Increased hepatic echogenicity consistent fatty infiltration or hepatocellular disease. 3.  Right pleural effusion. Electronically Signed   By: Maisie Fus  Register   On: 04/08/2018 10:05     Management plans discussed with the patient, family and they are in agreement.  CODE STATUS:     Code Status Orders  (From admission, onward)         Start     Ordered   04/07/18 0839  Full code  Continuous     04/07/18 0838        Code Status History    Date Active Date Inactive Code Status Order ID Comments User Context   02/12/2018 0339 02/27/2018 1740 Full Code [id number]  Oralia Manis, MD Inpatient      TOTAL TIME TAKING CARE OF  THIS PATIENT: 38 minutes.    Shye Doty M.D on 04/09/2018 at 9:49 AM  Between 7am to 6pm - Pager - [phone]  After 6pm go to [email or web address] - Scientist, research (life sciences) [facility]  Office  [phone]  CC: Primary care physician; [facility]   Note: This dictation was prepared with Nurse, children's dictation along with smaller phrase technology. Any transcriptional errors that result from this process are unintentional.

## 2018-04-09 NOTE — Care Management Note (Signed)
Case Management Note  Patient Details  Name: John Lawson MRN: [id number] Date of Birth: 1971/06/21   Patient admitted after Unresponsive episode.  Patient state that he normally lives at home with his sister.  However he has been staying with his ex wife Zella Ball.  Patient states that both his sister and Zella Ball provide transportation.  Patient sees Dr. Allena Katz at [facility].  Patient denies any issues obtaining his medication.  Patient is open with [facility] for nursing and PT.  Rosey Bath with [facility] notified of discharge and resumption orders.  Dressing change orders have been placed.  Jermaine with [facility] to deliver [facility] prior to discharge. PT has recommended wedge boot, and bedside RN is obtaining.  RNCM signing off.   Subjective/Objective:                    Action/Plan:   Expected Discharge Date:  04/09/18               Expected Discharge Plan:  Home w Home Health Services  In-House Referral:     Discharge planning Services  CM Consult  Post Acute Care Choice:  Durable Medical Equipment, Home Health, Resumption of Svcs/PTA Provider Choice offered to:  Patient  DME Arranged:  3-N-1 DME Agency:  [facility]  HH Arranged:  RN, PT [facility] Agency:  [facility] (now [facility])  Status of Service:  Completed, signed off  If discussed at Long Length of Stay Meetings, dates discussed:    Additional Comments:  Chapman Fitch, RN 04/09/2018, 12:29 PM

## 2018-04-09 NOTE — Evaluation (Addendum)
Physical Therapy Evaluation Patient Details Name: John Lawson MRN: [id number] DOB: [birth year] Today's Date: 04/09/2018   History of Present Illness  Pt is a 47 y.o male presenting after an episode of syncope and altered mental status. Pt also has sores on R foot currently wrapped in gauze currently following up with Podietry. PMH significant for toe amputation, CHF, diabetes, HTN, and skin debridement  Clinical Impression  Pt alert and awake upon arrival willing to work with PT this date. Initial examination halted before mobility d/t weight bearing concerns and discussed with Physician the need for ortho wedge shoe, which was ordered. When pt received ortho wedge shoe, pt demonstrating independence with basic bed mobility and transfer with minimal difficulty. Pt ambulated 180 feet with moderate gait pattern abnormalities largely due to height different between ortho wedge shoe and contralateral foot. Pt having a subjective decrease in balance and strength however safe and functional with use of RW. Pt is currently limited largely by R LE wounds and long term hip/knee pain. SpO2 dropping as low as 81 during initial standing and ambulation however with deep breathing able to return to high 80's during rest breaks. Pt would benefit from HHPT to improve pain and functional limitations in R LE and facilitate improved functional independence.     Follow Up Recommendations Home health PT    Equipment Recommendations  3in1 (PT)    Recommendations for Other Services       Precautions / Restrictions Restrictions Weight Bearing Restrictions: No      Mobility  Bed Mobility Overal bed mobility: Independent                Transfers   Equipment used: Rolling walker (2 wheeled) Transfers: Sit to/from Stand Sit to Stand: Modified independent (Device/Increase time)         General transfer comment: Increased time and effort required due to R LE pain and limited WBing through R  LE  Ambulation/Gait Ambulation/Gait assistance: Min guard Gait Distance (Feet): 180 Feet Assistive device: Rolling walker (2 wheeled)       General Gait Details: Pt ambulating with ortho wedge shoe on R with CGA for safety, step to gait on L with good confidence however inconsistent cadence and path.   Stairs            Wheelchair Mobility    Modified Rankin (Stroke Patients Only)       Balance Overall balance assessment: Needs assistance   Sitting balance-Leahy Scale: Normal     Standing balance support: Bilateral upper extremity supported Standing balance-Leahy Scale: Fair Standing balance comment: mild decreased balance noted during static standing with no UE support, 1 staggering LOB during standing however pt able to correct independently                             Pertinent Vitals/Pain Pain Assessment: 0-10 Pain Score: 8  Pain Location: R LE including hip, knee, and foot Pain Descriptors / Indicators: Grimacing Pain Intervention(s): Limited activity within patient's tolerance;Monitored during session    Home Living Family/patient expects to be discharged to:: Private residence Living Arrangements: Spouse/significant other Available Help at Discharge: Family Type of Home: House Home Access: Stairs to enter Entrance Stairs-Rails: Can reach both Entrance Stairs-Number of Steps: 5 Home Layout: One level Home Equipment: Grab bars - tub/shower;Grab bars - toilet;Walker - 2 wheels      Prior Function Level of Independence: Independent with assistive device(s)  Hand Dominance        Extremity/Trunk Assessment   Upper Extremity Assessment Upper Extremity Assessment: Overall WFL for tasks assessed    Lower Extremity Assessment Lower Extremity Assessment: Generalized weakness(grossly at least 3/5 however pain and decreased strength typical to recent baseline)       Communication   Communication: No difficulties   Cognition Arousal/Alertness: Awake/alert Behavior During Therapy: WFL for tasks assessed/performed Overall Cognitive Status: Within Functional Limits for tasks assessed                                        General Comments General comments (skin integrity, edema, etc.): SpO2 ranging from low 80's to high 80's t/o. When SpO2 dropping into low 80's activity paused and focused on purposeful breathing to return SpO2 levels to high 80's.     Exercises     Assessment/Plan    PT Assessment Patient needs continued PT services  PT Problem List Decreased activity tolerance;Decreased balance;Decreased mobility;Decreased strength;Decreased knowledge of use of DME;Decreased safety awareness;Cardiopulmonary status limiting activity;Decreased skin integrity;Pain       PT Treatment Interventions DME instruction;Gait training;Stair training;Therapeutic exercise;Therapeutic activities;Patient/family education;Balance training;Functional mobility training    PT Goals (Current goals can be found in the Care Plan section)  Acute Rehab PT Goals Patient Stated Goal: regain strength and improve walking abilities PT Goal Formulation: With patient Time For Goal Achievement: 04/23/18 Potential to Achieve Goals: Good    Frequency Min 2X/week   Barriers to discharge        Co-evaluation        AM-PAC PT "6 Clicks" Daily Activity  Outcome Measure Difficulty turning over in bed (including adjusting bedclothes, sheets and blankets)?: None Difficulty moving from lying on back to sitting on the side of the bed? : None Difficulty sitting down on and standing up from a chair with arms (e.g., wheelchair, bedside commode, etc,.)?: None Help needed moving to and from a bed to chair (including a wheelchair)?: None Help needed walking in hospital room?: A Little Help needed climbing 3-5 steps with a railing? : A Little 6 Click Score: 22    End of Session Equipment Utilized During  Treatment: Gait belt Activity Tolerance: Patient tolerated treatment well;No increased pain Patient left: in bed;with call bell/phone within reach;with bed alarm set Nurse Communication: Mobility status PT Visit Diagnosis: Unsteadiness on feet (R26.81);Muscle weakness (generalized) (M62.81);Difficulty in walking, not elsewhere classified (R26.2);Pain Pain - Right/Left: Right Pain - part of body: Ankle and joints of foot;Knee;Hip    Time: 1610-9604(54 minutes AM initiation of care with history gathering and initial mobility assessment ) PT Time Calculation (min) (ACUTE ONLY): 22 min   Charges:              Mickel Duhamel, SPT 04/09/2018, 3:42 PM   During this treatment session, the therapist was present, participating in and directing the treatment.  This note has been reviewed and this clinician agrees with the information provided.    Loran Senters, PT, DPT [phone]

## 2018-04-09 NOTE — Progress Notes (Signed)
Discharge teaching given to patient, patient verbalized understanding and had no questions. Patient IV removed. Patient will be transported home by family. All patient belongings gathered prior to leaving.  

## 2018-04-12 LAB — CULTURE, BLOOD (ROUTINE X 2)
Culture: NO GROWTH
Culture: NO GROWTH
SPECIAL REQUESTS: ADEQUATE
Special Requests: ADEQUATE

## 2018-04-14 ENCOUNTER — Ambulatory Visit: Payer: Medicaid Other | Attending: Neurology

## 2018-04-14 DIAGNOSIS — R0683 Snoring: Secondary | ICD-10-CM | POA: Insufficient documentation

## 2018-04-14 DIAGNOSIS — R400 Somnolence: Secondary | ICD-10-CM | POA: Diagnosis present

## 2018-04-21 ENCOUNTER — Telehealth: Payer: Self-pay | Admitting: Family

## 2018-04-21 ENCOUNTER — Ambulatory Visit: Payer: Disability Insurance | Admitting: Family

## 2018-04-21 NOTE — Telephone Encounter (Signed)
Patient did not show for his Heart Failure Clinic appointment on 04/21/18. Will attempt to reschedule.

## 2018-05-07 ENCOUNTER — Ambulatory Visit: Payer: Medicaid Other | Attending: Internal Medicine

## 2018-05-12 ENCOUNTER — Ambulatory Visit: Payer: Disability Insurance | Admitting: Family

## 2018-05-22 NOTE — Progress Notes (Deleted)
Patient ID: John Lawson, male    DOB: 06/08/1971, 47 y.o.   MRN: [id number]  HPI  John Lawson is a 47 y/o male with a history of HTN, DM and chronic heart failure.   Echo report from 04/07/18 reviewed and showed an EF of 50-55%. Echo report from 02/12/18 reviewed and showed an EF of 50-55%.  Admitted 04/07/18 due to being unresponsive due to acute encephalopathy. Initially needed IV lasix and then transitioned to oral diuretics. Discharged after 2 days. Admitted 02/12/18 due to acute on chronic HF along with HTN. Initially placed on lasix drip and then transitioned to oral diuretics. Needed bipap short-term. Nephrology, cardiology and wound care consulted. Medications adjusted and he was discharged after 15 days.   He presents today for a follow-up visit with a chief complaint of   Past Medical History:  Diagnosis Date  . Chronic diastolic CHF (congestive heart failure) (HCC)   . Diabetes mellitus without complication (HCC)   . Hypertension    Past Surgical History:  Procedure Laterality Date  . SKIN DEBRIDEMENT    . SPHINCTEROTOMY    . TOE AMPUTATION     Family History  Problem Relation Age of Onset  . Diabetes Mother   . Cancer Mother   . Thyroid disease Mother   . Diabetes Father   . Diabetes Sister    Social History   Tobacco Use  . Smoking status: Never Smoker  . Smokeless tobacco: Never Used  Substance Use Topics  . Alcohol use: Never    Frequency: Never   Allergies  Allergen Reactions  . Tylenol [Acetaminophen] Nausea And Vomiting    Takes Percocet at home    Review of Systems  Constitutional: Positive for fatigue (tire easily). Negative for appetite change.  HENT: Negative for congestion, postnasal drip and sore throat.   Eyes: Negative.   Respiratory: Positive for shortness of breath (easily). Negative for cough.   Cardiovascular: Positive for chest pain (on occasion) and leg swelling. Negative for palpitations.  Gastrointestinal: Negative for abdominal  distention and abdominal pain.  Endocrine: Negative.   Genitourinary: Negative.   Musculoskeletal: Positive for back pain and neck pain.  Allergic/Immunologic: Negative.   Neurological: Positive for dizziness and light-headedness.  Hematological: Negative for adenopathy. Does not bruise/bleed easily.  Psychiatric/Behavioral: Positive for sleep disturbance (not sleeping well). Negative for dysphoric mood. The patient is not nervous/anxious.      Physical Exam  Constitutional: He is oriented to person, place, and time. He appears well-developed and well-nourished.  HENT:  Head: Normocephalic and atraumatic.  Neck: Normal range of motion. Neck supple. No JVD present.  Cardiovascular: Normal rate and regular rhythm.  Pulmonary/Chest: Effort normal. No respiratory distress. He has no wheezes. He has no rales.  Abdominal: Soft. He exhibits no distension. There is no tenderness.  Musculoskeletal: He exhibits edema (2+ pitting edema with R>L). He exhibits no tenderness.  Neurological: He is alert and oriented to person, place, and time.  Skin: Skin is warm and dry.  Psychiatric: He has a normal mood and affect. His behavior is normal. Thought content normal.  Nursing note and vitals reviewed.   Assessment & Plan:  1: Chronic heart failure with preserved ejection fraction- - NYHA class III - mildly fluid overloaded today with pitting edema - weighing daily but says that he's been weighing himself at night. Instructed to weigh every morning and call for an overnight weight gain of >2 pounds or a weekly weight gain of >5 pounds - weight  -  not adding salt and he and his wife have been reviewing food labels for sodium content. Reviewed the importance of closely following a 2000mg  sodium diet - currently has [facility] coming twice weekly - BNP 04/07/18 was 39.0  2: HTN- - BP  - sees Dr. Allena Katz at [facility] on 9/191/9 - BMP 04/09/18 reviewed and showed sodium 139,  potassium 5.2, creatinine 1.62 and GFR 48  3: DM- - glucose at home was  - A1c 02/17/18 was 7.5%  4: Lymphedema- - stage 2 - has wrapped legs in the past but hasn't been consistently doing it recently; encouraged him to wrap them daily with removal at bedtime - elevate legs when sitting for long periods of time - consider lymphapress compression boots if edema persists  5: Snoring- - patient and wife endorse snoring - patient says that he can go many days without sleeping and he doesn't know why - will make referral to [facility] to get a sleep study to rule out sleep apnea  Medication list was reviewed.

## 2018-05-24 ENCOUNTER — Ambulatory Visit: Payer: Medicaid Other | Admitting: Family

## 2018-05-26 ENCOUNTER — Telehealth: Payer: Self-pay | Admitting: Family

## 2018-05-26 ENCOUNTER — Ambulatory Visit: Payer: Medicaid Other | Admitting: Family

## 2018-05-26 NOTE — Telephone Encounter (Signed)
Patient did not show for his [facility] Failure [facility] appointment on 05/26/18. Will attempt to reschedule.

## 2018-09-17 IMAGING — DX DG CHEST 1V PORT
1 series · 1 of 1 positions shown · non-contrast
Comparison: Chest radiograph performed 10/23/2014, and CTA of the
chest performed 10/24/2014

CLINICAL DATA: Acute onset of shortness of breath. Expiratory
wheezing.

EXAM:
PORTABLE CHEST 1 VIEW

[chest ap]
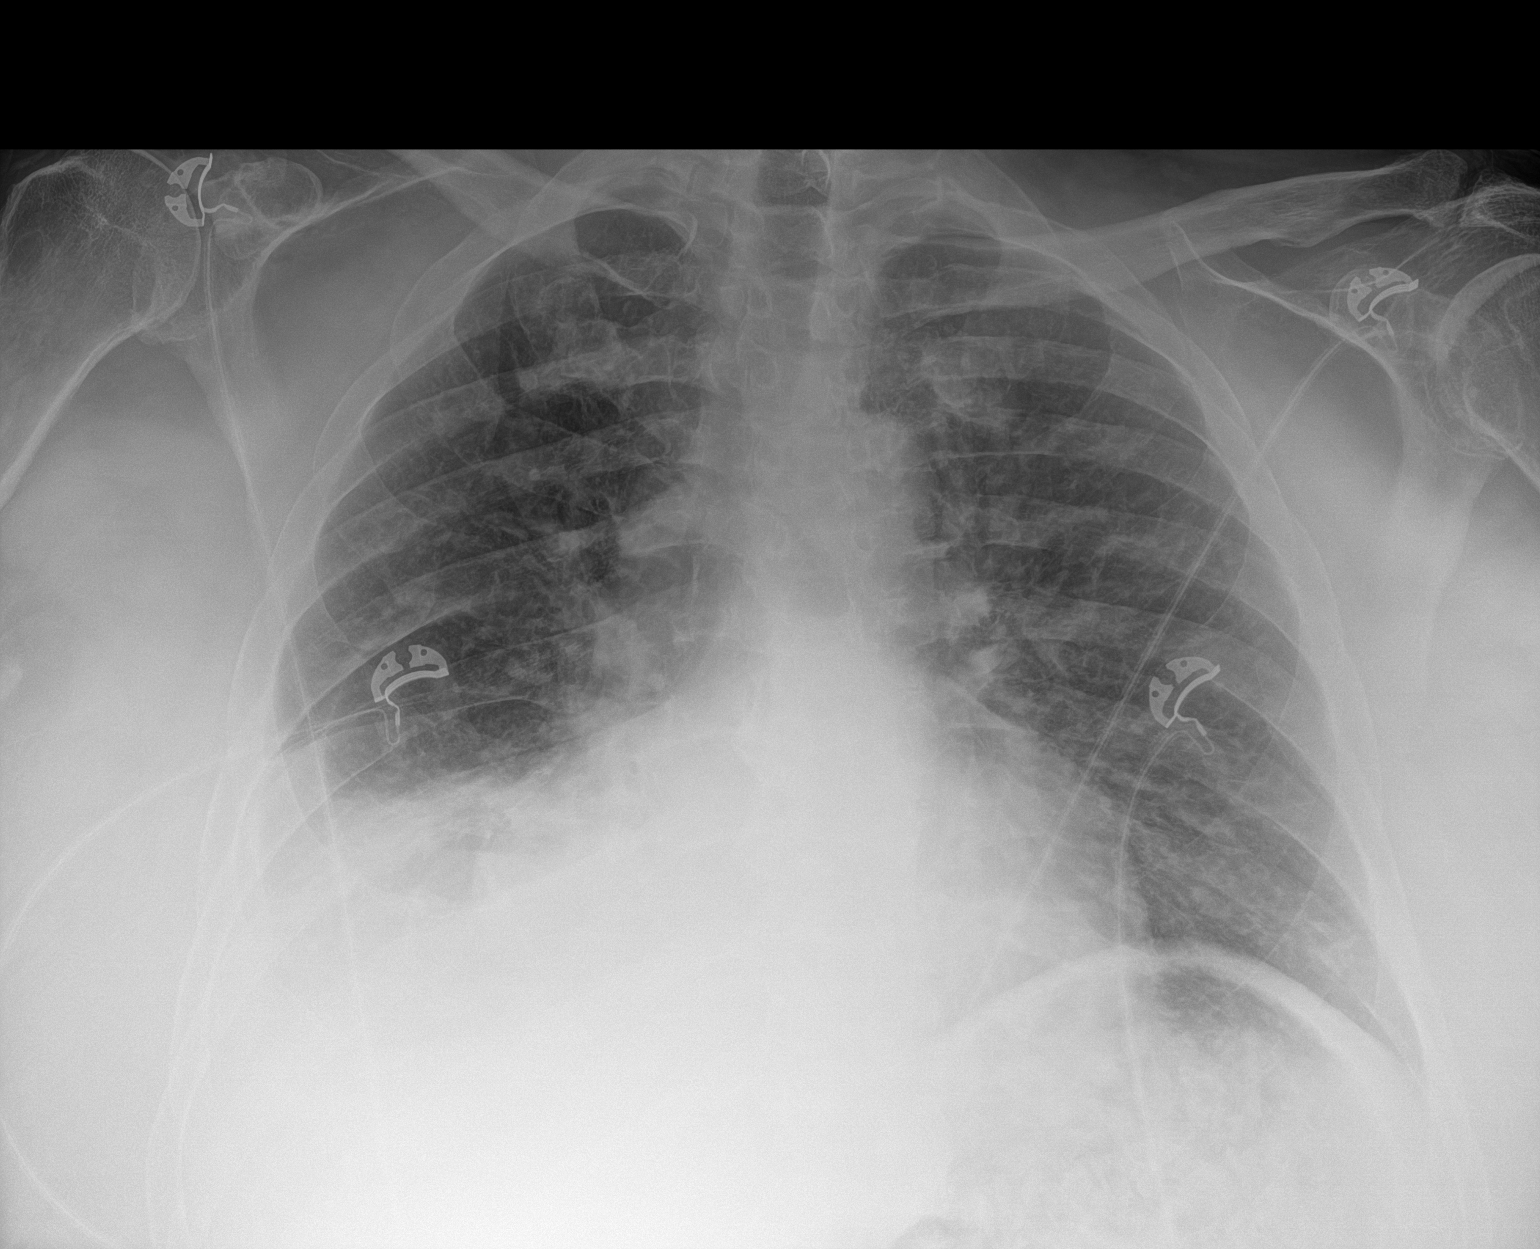

[1 of 1 positions shown; findings below may reference images not displayed]

FINDINGS: A small right pleural effusion is noted, with associated opacity.
Increased interstitial markings may reflect mild interstitial edema.
Alternatively, right basilar pneumonia could have a similar
appearance. No pneumothorax is seen.

The cardiomediastinal silhouette is borderline normal in size. No
acute osseous abnormalities are identified.
IMPRESSION: Small right pleural effusion noted. Increased interstitial markings
may reflect mild interstitial edema. Alternatively, right basilar
pneumonia could have a similar appearance.

## 2018-09-19 IMAGING — DX DG CHEST 1V PORT
1 series · 1 of 1 positions shown · non-contrast
Comparison: 02/12/2018

CLINICAL DATA: Atelectasis

EXAM:
PORTABLE CHEST 1 VIEW

[chest ap]
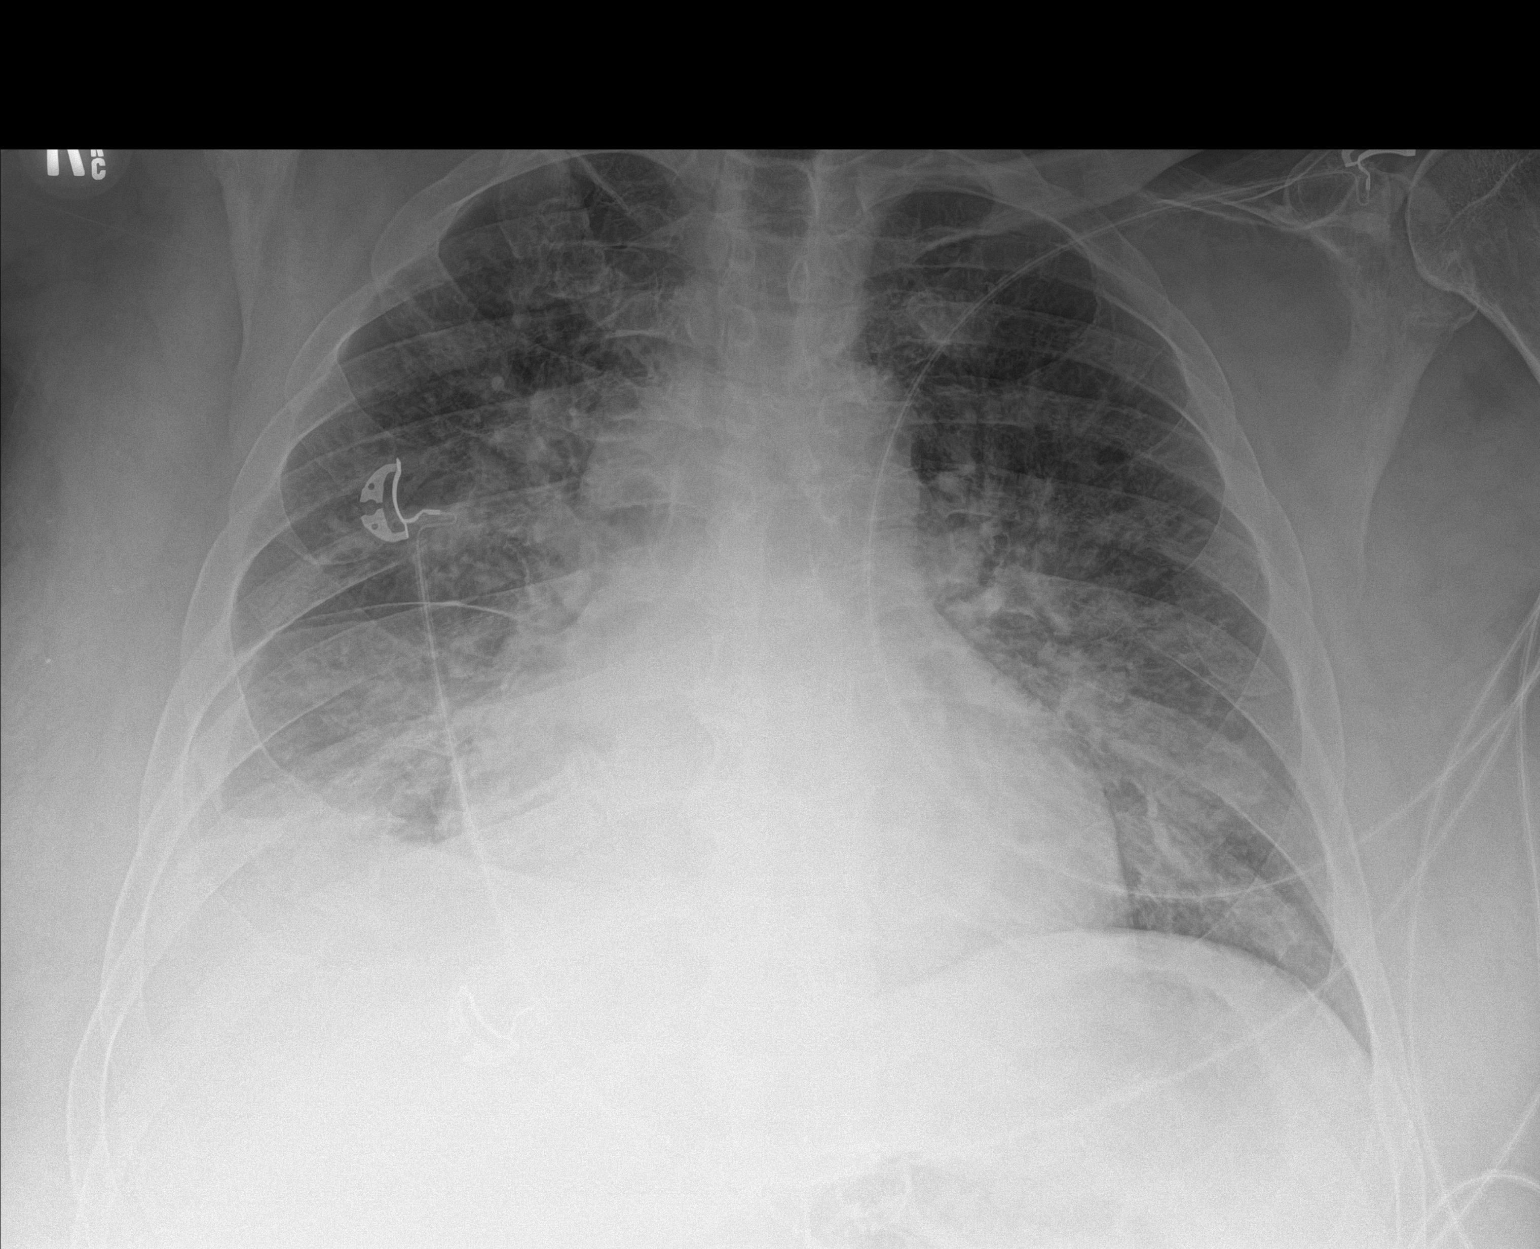

[1 of 1 positions shown; findings below may reference images not displayed]

FINDINGS: Increased interstitial markings with a perihilar distribution,
increased, favoring mild interstitial edema. Small right pleural
effusion. Associated right basilar opacity, likely atelectasis,
unchanged. No pneumothorax.

Cardiomegaly.
IMPRESSION: Cardiomegaly with mild interstitial edema, increased. Small right
pleural effusion.

Associated right lower lobe opacity, likely atelectasis, unchanged.

## 2018-11-10 IMAGING — CT CT HEAD W/O CM
3 series · 16 of 47 positions shown, 19 images · non-contrast
Comparison: None.

CLINICAL DATA: Confusion.  Unresponsive.

EXAM:
CT HEAD WITHOUT CONTRAST
TECHNIQUE: Contiguous axial images were obtained from the base of the skull
through the vertex without intravenous contrast.

[Series 2: head wo · axial · 0.46mm/px · z∈[-54,+71]mm · 10 of 31 slices shown, 13 images]
[im 3/31  brain]
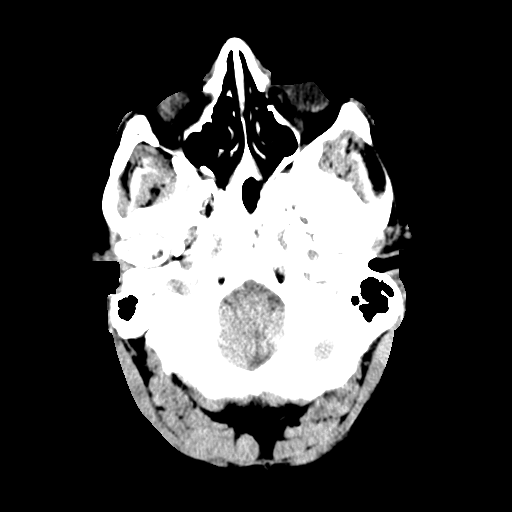
[im 3/31  bone]
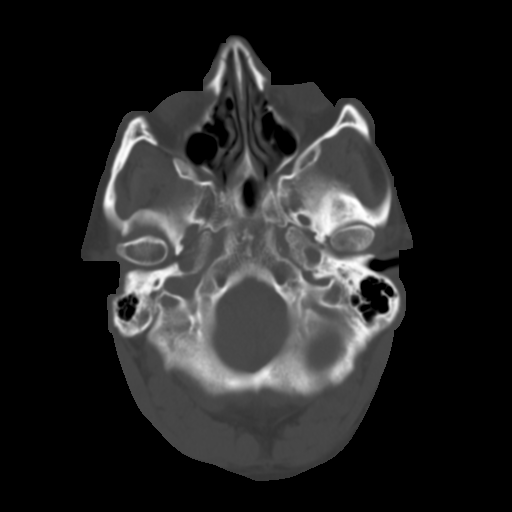
[im 6/31  brain]
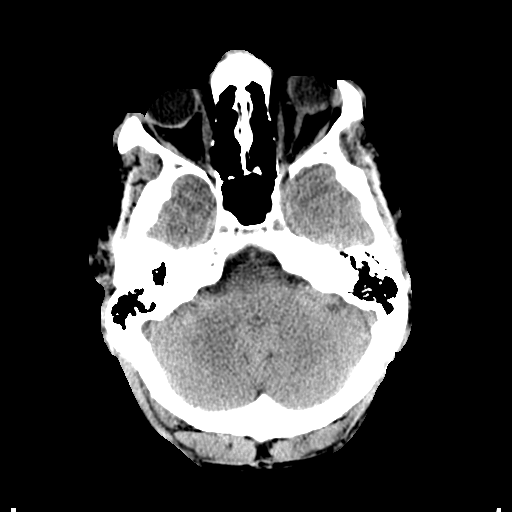
[im 9/31  brain]
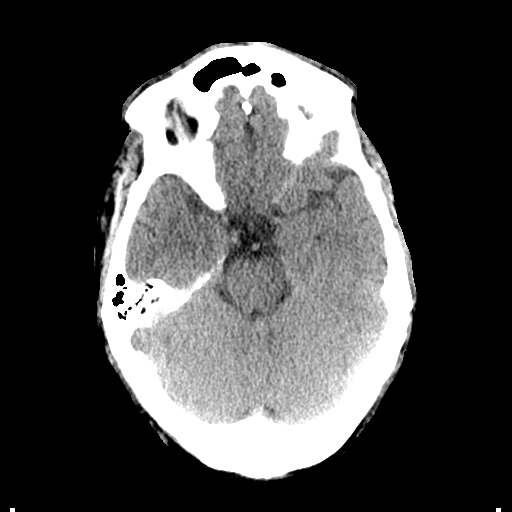
[im 11/31  brain]
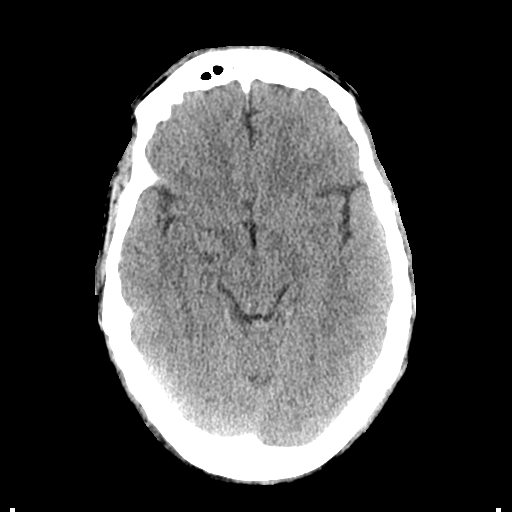
[im 14/31  brain]
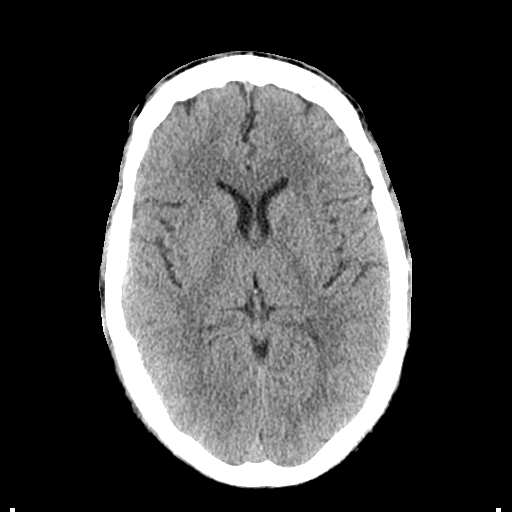
[im 14/31  bone]
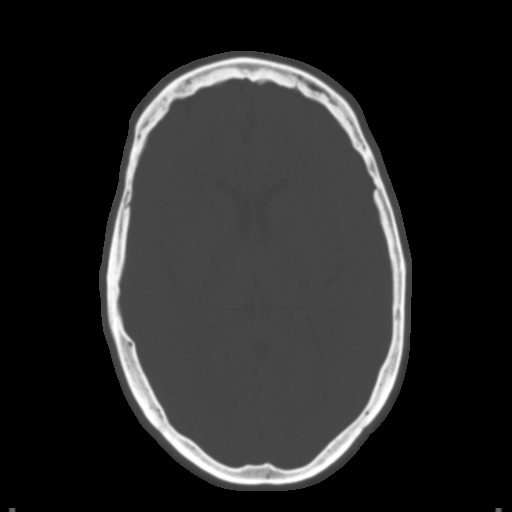
[im 17/31  brain]
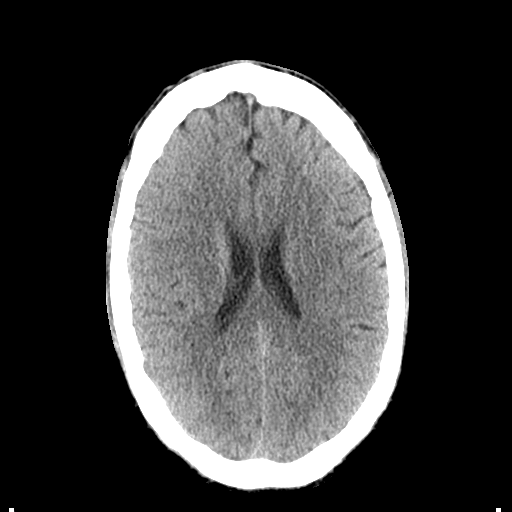
[im 20/31  brain]
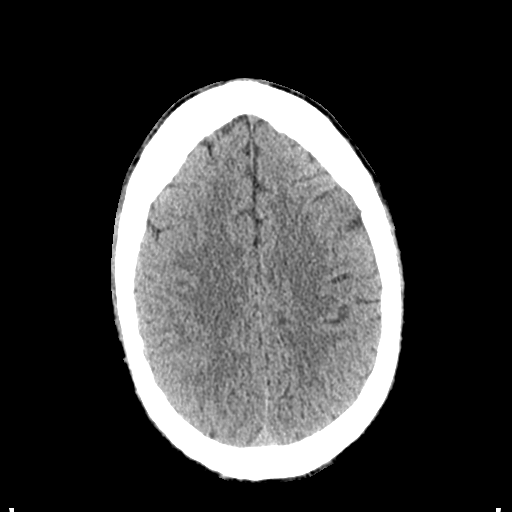
[im 23/31  brain]
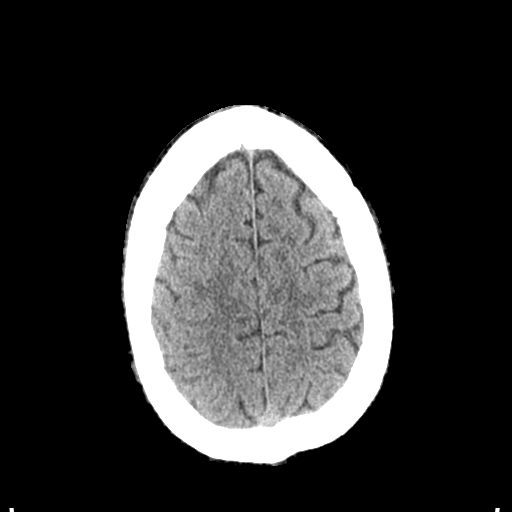
[im 25/31  brain]
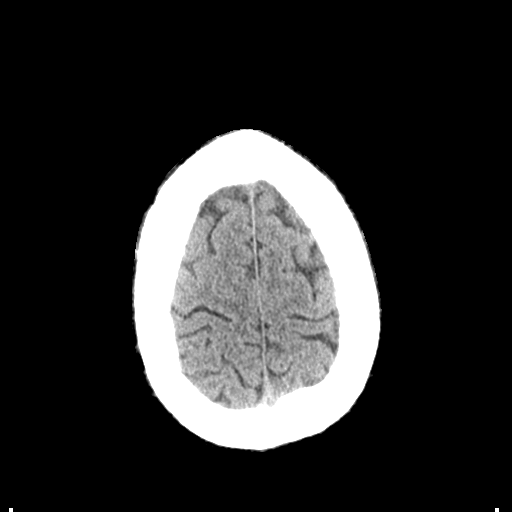
[im 25/31  bone]
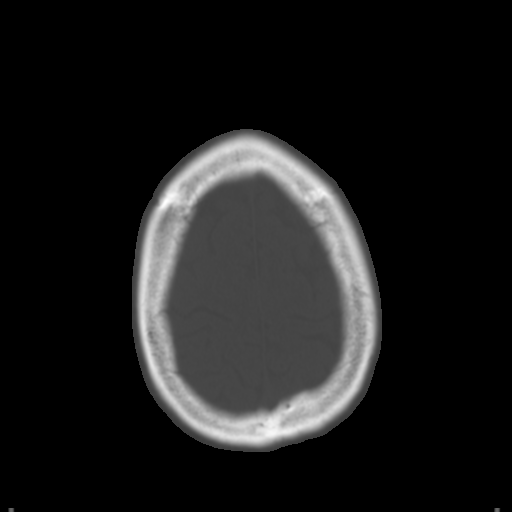
[im 28/31  brain]
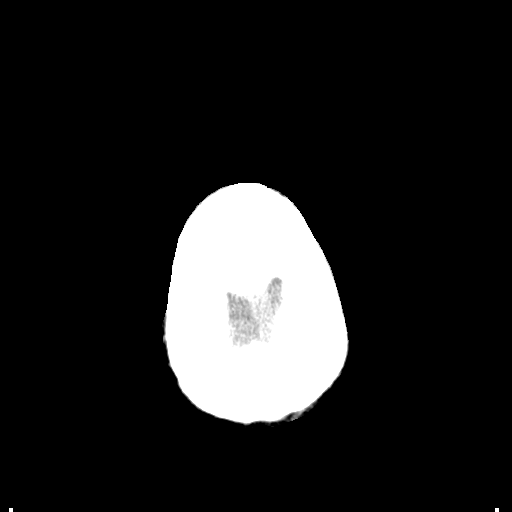

[Series 4: coronal soft tissue · coronal · 0.29mm/px · 3 of 69 slices shown]
[im 23/69  brain]
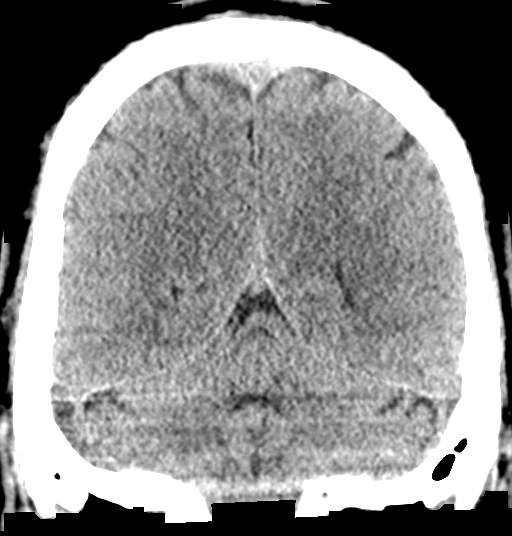
[im 31/69  brain]
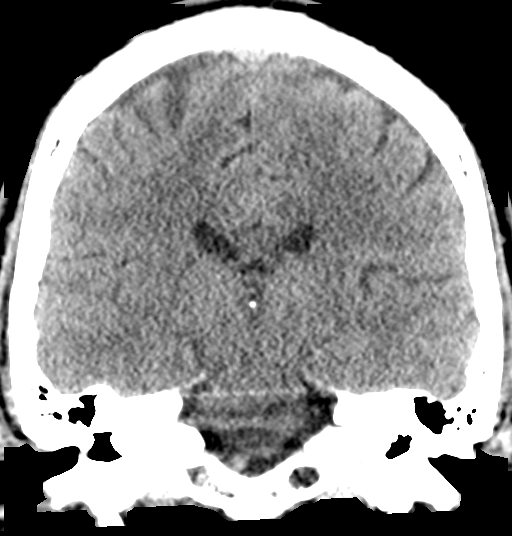
[im 38/69  brain]
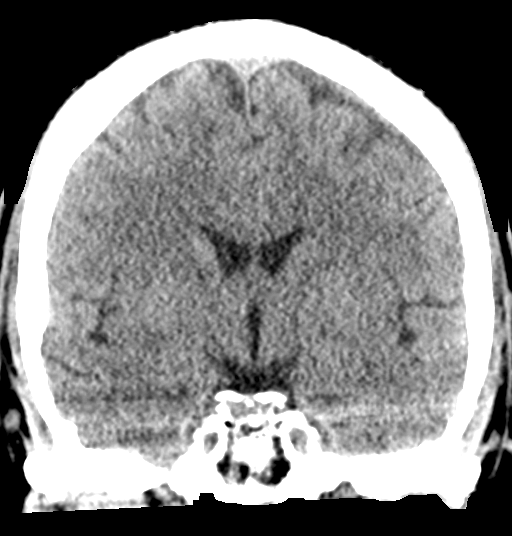

[Series 5: sagittal soft tissue · sagittal · 0.30mm/px · 3 of 50 slices shown]
[im 17/50  brain]
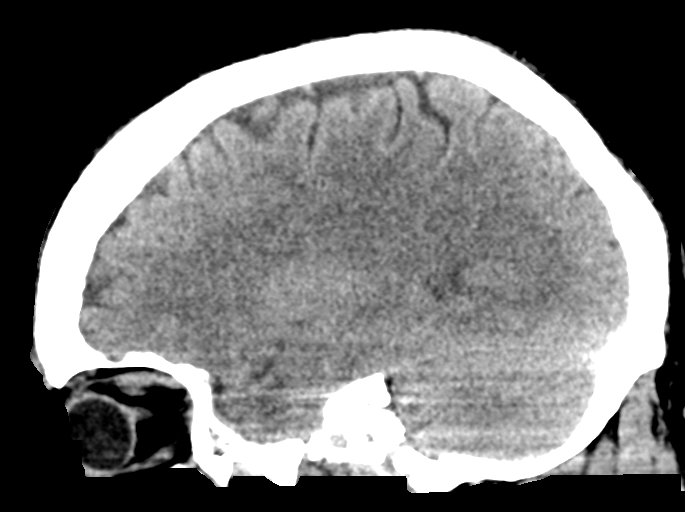
[im 25/50  brain]
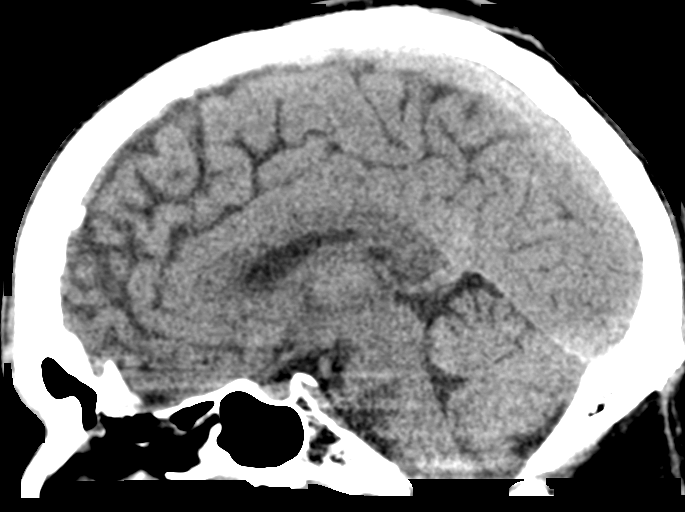
[im 33/50  brain]
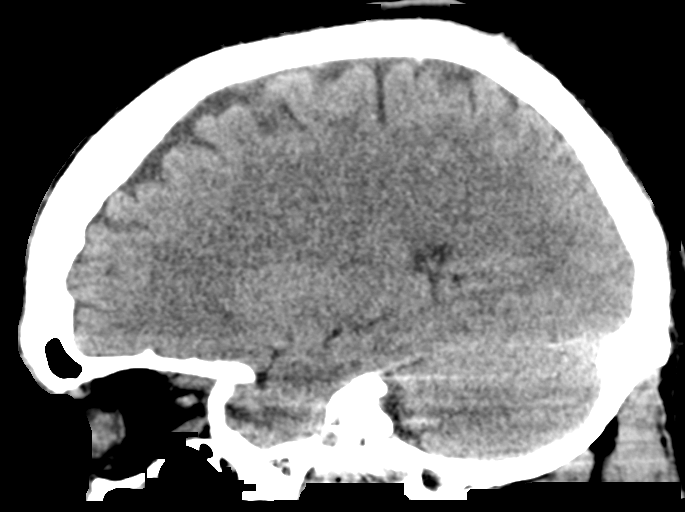

[16 of 47 positions shown; findings below may reference images not displayed]

FINDINGS: Brain: No intracranial hemorrhage, mass effect, or midline shift. No
hydrocephalus. The basilar cisterns are patent. No evidence of
territorial infarct or acute ischemia. No extra-axial or
intracranial fluid collection.

Vascular: No hyperdense vessel.

Skull: Normal. Negative for fracture or focal lesion.

Sinuses/Orbits: Paranasal sinuses and mastoid air cells are clear.
The visualized orbits are unremarkable.

Other: None.
IMPRESSION: Negative head CT.

## 2018-11-11 IMAGING — US US ABDOMEN COMPLETE
1 series · 14 of 25 positions shown · non-contrast
Comparison: Ultrasound 02/13/2018.  KUB 03/04/2017.

CLINICAL DATA: Cirrhosis.

EXAM:
ABDOMEN ULTRASOUND COMPLETE

[Series 1: us abdomen complete · 14 of 82 slices shown]
[im 1/82]
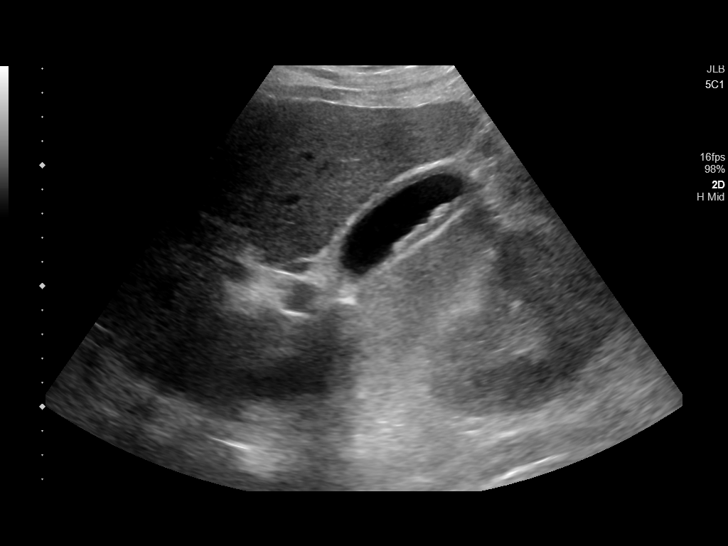
[im 7/82]
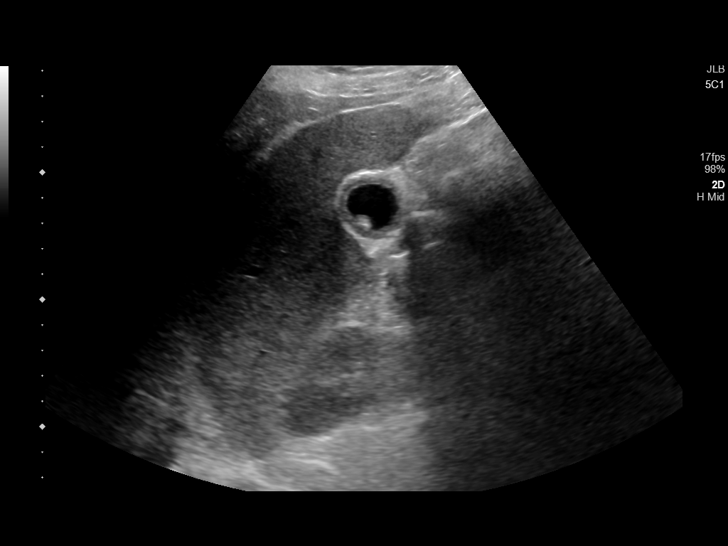
[im 14/82]
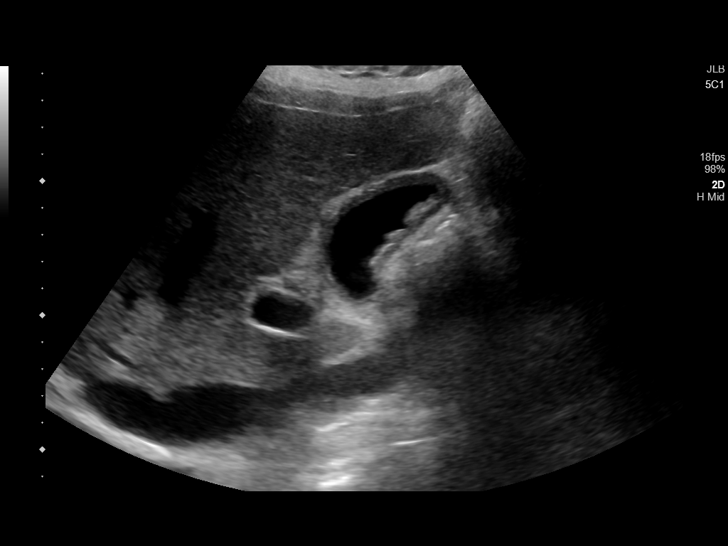
[im 21/82]
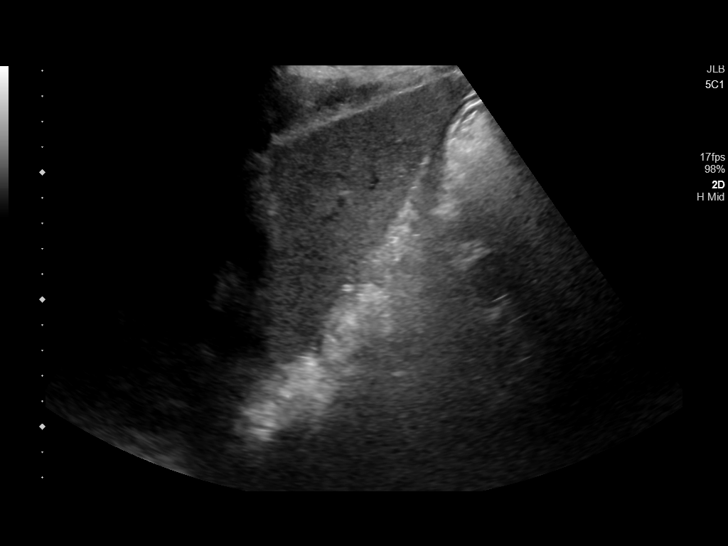
[im 28/82]
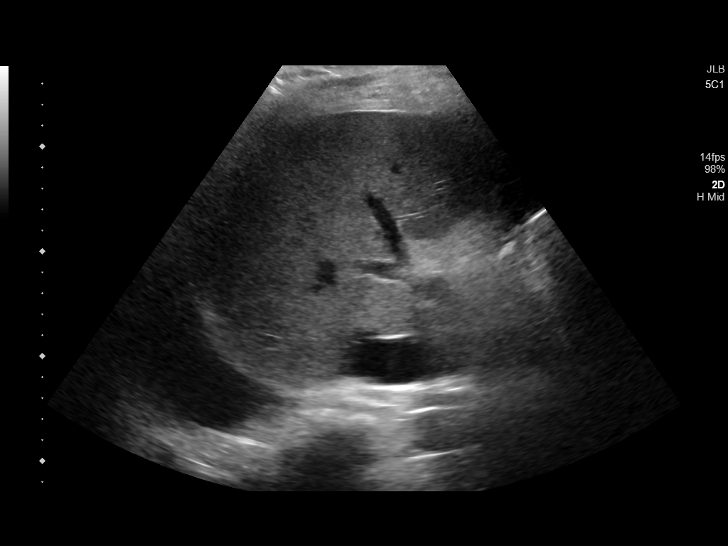
[im 31/82]
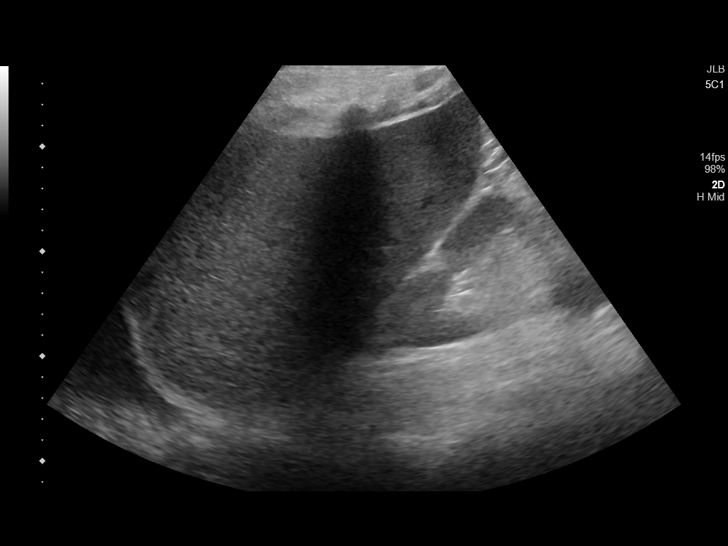
[im 38/82]
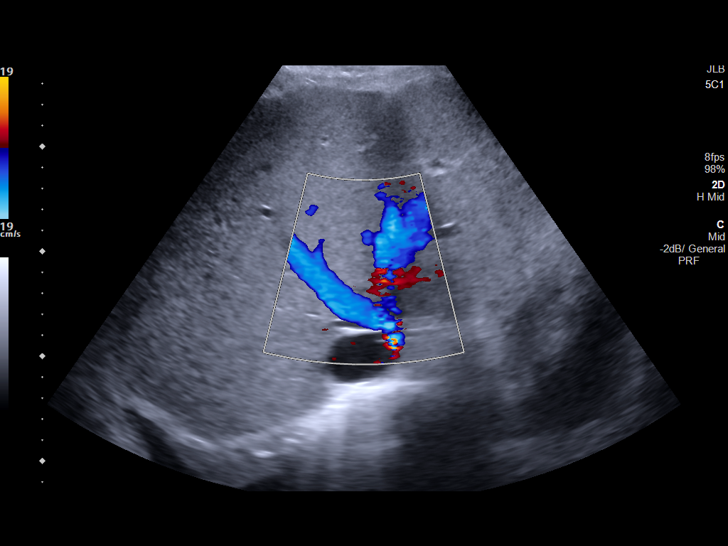
[im 44/82]
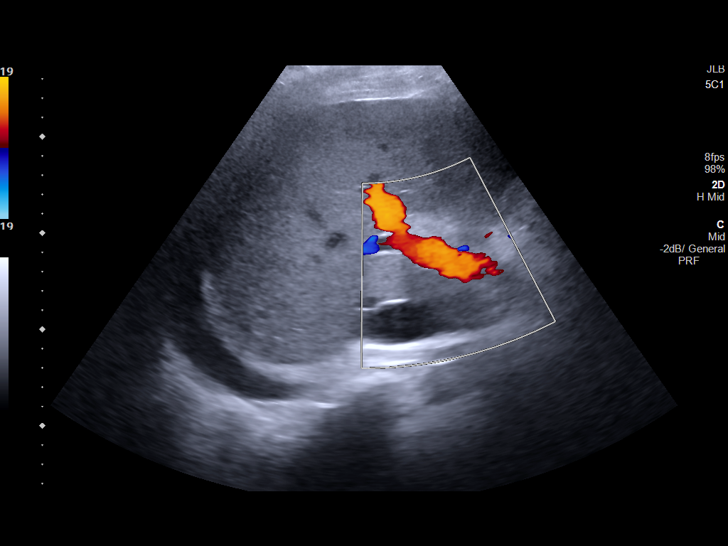
[im 51/82]
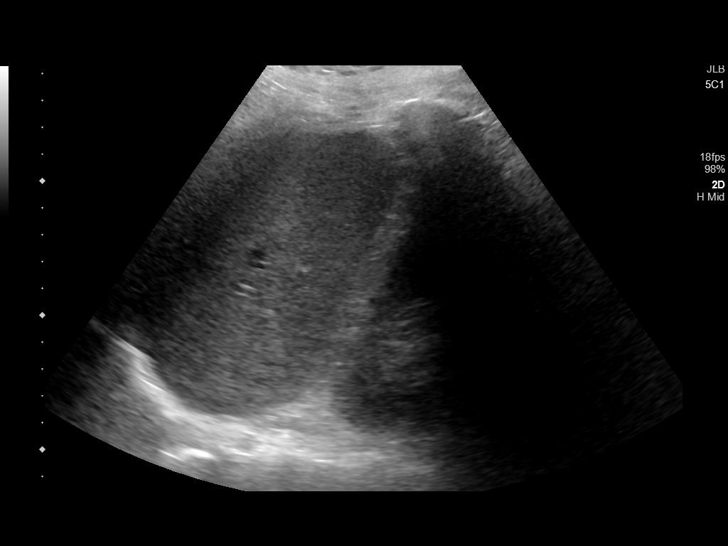
[im 55/82]
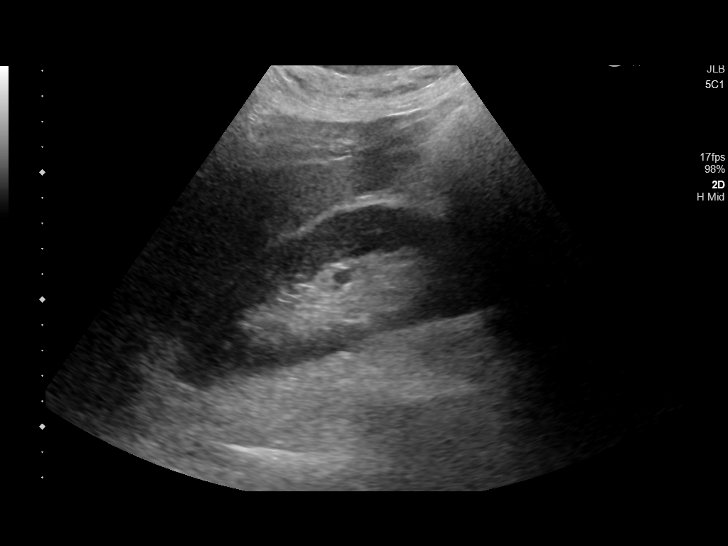
[im 61/82]
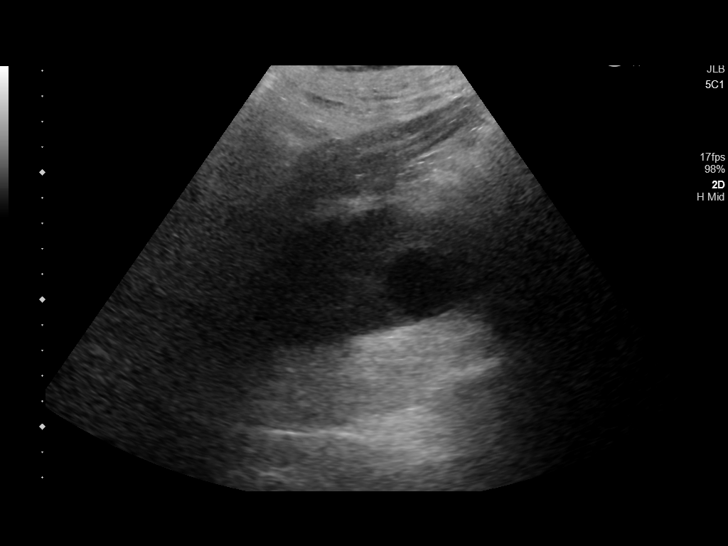
[im 68/82]
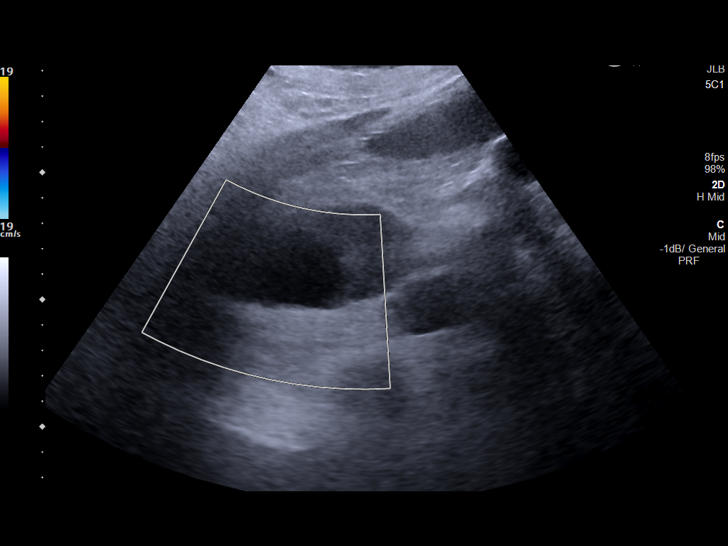
[im 75/82]
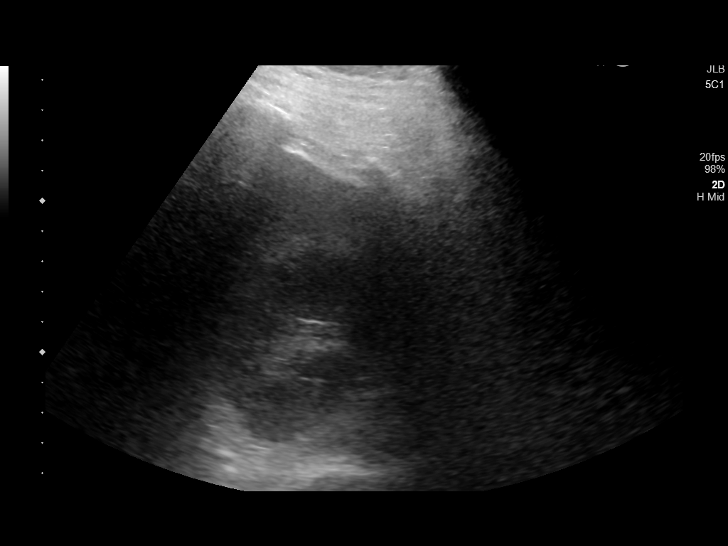
[im 82/82]
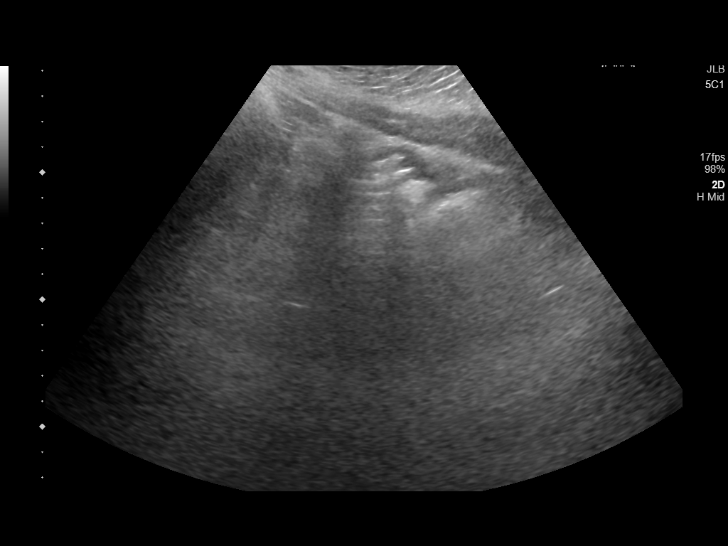

[14 of 25 positions shown; findings below may reference images not displayed]

FINDINGS: Gallbladder: Multiple small gallstones noted.. No sonographic Murphy
sign noted by sonographer.

Common bile duct: Diameter: 6.4 mm

Liver: Increased echogenicity consistent with fatty infiltration or
hepatocellular disease. Portal vein is patent on color Doppler
imaging with normal direction of blood flow towards the liver.

IVC: No abnormality visualized.

Pancreas: Visualized portion unremarkable.

Spleen: Size and appearance within normal limits.

Right Kidney: Length: 13.5 cm. Echogenicity within normal limits.
3.5 cm simple cyst. No hydronephrosis visualized.

Left Kidney: Length: 12.8 cm. Echogenicity within normal limits. No
mass or hydronephrosis visualized.

Abdominal aorta: No aneurysm visualized.

Other findings: Right pleural effusion noted.
IMPRESSION: 1.  Gallstones.  No biliary distention.

2. Increased hepatic echogenicity consistent fatty infiltration or
hepatocellular disease.

3.  Right pleural effusion.
